# Patient Record
Sex: Male | Born: 1955 | Race: Black or African American | Hispanic: No | Marital: Married | Smoking: Never smoker
Health system: Southern US, Community
[De-identification: ages and names within clinical notes are randomized; demographics above are authoritative.]

## PROBLEM LIST (undated history)

## (undated) DIAGNOSIS — K649 Unspecified hemorrhoids: Secondary | ICD-10-CM

## (undated) DIAGNOSIS — E785 Hyperlipidemia, unspecified: Secondary | ICD-10-CM

## (undated) DIAGNOSIS — N529 Male erectile dysfunction, unspecified: Secondary | ICD-10-CM

## (undated) DIAGNOSIS — G43909 Migraine, unspecified, not intractable, without status migrainosus: Secondary | ICD-10-CM

## (undated) DIAGNOSIS — R7303 Prediabetes: Secondary | ICD-10-CM

## (undated) DIAGNOSIS — I10 Essential (primary) hypertension: Secondary | ICD-10-CM

## (undated) DIAGNOSIS — N35919 Unspecified urethral stricture, male, unspecified site: Secondary | ICD-10-CM

## (undated) HISTORY — DX: Prediabetes: R73.03

## (undated) HISTORY — DX: Essential (primary) hypertension: I10

## (undated) HISTORY — PX: BUNIONECTOMY: SHX129

## (undated) HISTORY — DX: Unspecified hemorrhoids: K64.9

## (undated) HISTORY — DX: Male erectile dysfunction, unspecified: N52.9

## (undated) HISTORY — PX: INGUINAL HERNIA REPAIR: SUR1180

## (undated) HISTORY — PX: APPENDECTOMY: SHX54

## (undated) HISTORY — DX: Hyperlipidemia, unspecified: E78.5

---

## 1998-03-17 ENCOUNTER — Encounter: Payer: Self-pay | Admitting: Urology

## 1998-03-17 ENCOUNTER — Ambulatory Visit (HOSPITAL_COMMUNITY): Admission: RE | Admit: 1998-03-17 | Discharge: 1998-03-17 | Payer: Self-pay | Admitting: Urology

## 1998-03-31 ENCOUNTER — Ambulatory Visit (HOSPITAL_BASED_OUTPATIENT_CLINIC_OR_DEPARTMENT_OTHER): Admission: RE | Admit: 1998-03-31 | Discharge: 1998-03-31 | Payer: Self-pay | Admitting: Urology

## 1998-04-21 ENCOUNTER — Ambulatory Visit (HOSPITAL_COMMUNITY): Admission: RE | Admit: 1998-04-21 | Discharge: 1998-04-21 | Payer: Self-pay | Admitting: *Deleted

## 2001-04-03 ENCOUNTER — Ambulatory Visit (HOSPITAL_COMMUNITY): Admission: RE | Admit: 2001-04-03 | Discharge: 2001-04-03 | Payer: Self-pay | Admitting: *Deleted

## 2003-02-15 ENCOUNTER — Emergency Department (HOSPITAL_COMMUNITY): Admission: EM | Admit: 2003-02-15 | Discharge: 2003-02-15 | Payer: Self-pay | Admitting: Emergency Medicine

## 2003-03-22 ENCOUNTER — Emergency Department (HOSPITAL_COMMUNITY): Admission: EM | Admit: 2003-03-22 | Discharge: 2003-03-23 | Payer: Self-pay | Admitting: Emergency Medicine

## 2008-04-25 ENCOUNTER — Emergency Department (HOSPITAL_BASED_OUTPATIENT_CLINIC_OR_DEPARTMENT_OTHER): Admission: EM | Admit: 2008-04-25 | Discharge: 2008-04-25 | Payer: Self-pay | Admitting: Emergency Medicine

## 2008-11-22 ENCOUNTER — Emergency Department (HOSPITAL_BASED_OUTPATIENT_CLINIC_OR_DEPARTMENT_OTHER): Admission: EM | Admit: 2008-11-22 | Discharge: 2008-11-22 | Payer: Self-pay | Admitting: Emergency Medicine

## 2009-04-03 ENCOUNTER — Ambulatory Visit: Payer: Self-pay | Admitting: Family Medicine

## 2009-04-03 ENCOUNTER — Emergency Department (HOSPITAL_BASED_OUTPATIENT_CLINIC_OR_DEPARTMENT_OTHER): Admission: EM | Admit: 2009-04-03 | Discharge: 2009-04-04 | Payer: Self-pay | Admitting: Emergency Medicine

## 2009-04-03 DIAGNOSIS — I10 Essential (primary) hypertension: Secondary | ICD-10-CM | POA: Insufficient documentation

## 2009-04-03 DIAGNOSIS — E785 Hyperlipidemia, unspecified: Secondary | ICD-10-CM | POA: Insufficient documentation

## 2009-04-16 ENCOUNTER — Ambulatory Visit (HOSPITAL_COMMUNITY): Admission: RE | Admit: 2009-04-16 | Discharge: 2009-04-16 | Payer: Self-pay | Admitting: Gastroenterology

## 2009-04-20 HISTORY — PX: COLONOSCOPY: SHX174

## 2009-05-14 ENCOUNTER — Ambulatory Visit (HOSPITAL_COMMUNITY): Admission: RE | Admit: 2009-05-14 | Discharge: 2009-05-14 | Payer: Self-pay | Admitting: General Surgery

## 2009-05-14 HISTORY — PX: OTHER SURGICAL HISTORY: SHX169

## 2009-08-29 ENCOUNTER — Emergency Department (HOSPITAL_BASED_OUTPATIENT_CLINIC_OR_DEPARTMENT_OTHER): Admission: EM | Admit: 2009-08-29 | Discharge: 2009-08-29 | Payer: Self-pay | Admitting: Emergency Medicine

## 2010-03-08 NOTE — Assessment & Plan Note (Signed)
Summary: Sinus pressure, teeth/eye pain x last night  proced rm   Vital Signs:  Patient Profile:   55 Years Old Male CC:      Headache, Nausea x  Height:     76 inches Weight:      240 pounds O2 Sat:      96 % O2 treatment:    Room Air Temp:     98.0 degrees F oral Pulse rate:   53 / minute Pulse rhythm:   irregular Resp:     16 per minute BP sitting:   147 / 86  (right arm) Cuff size:   regular  Vitals Entered By: Areta Haber CMA (April 03, 2009 9:56 AM)                  Current Allergies: No known allergies History of Present Illness History from: patient Chief Complaint: Headache, Nausea x  History of Present Illness: ha last eve, now with nausea and head congestion  Current Problems: UPPER RESPIRATORY INFECTION, ACUTE (ICD-465.9) MIGRAINE (ICD-346.00) FAMILY HISTORY DIABETES 1ST DEGREE RELATIVE (ICD-V18.0) HYPERTENSION (ICD-401.9) HYPERLIPIDEMIA (ICD-272.4)   Current Meds ASPIR-LOW 81 MG TBEC (ASPIRIN) 1 tab by mouth once daily LOVAZA 1 GM CAPS (OMEGA-3-ACID ETHYL ESTERS) 1 tab by mouth once daily VITAMIN C 1000 MG TABS (ASCORBIC ACID) 1 tab by mouth once daily * BP MEDICATION 1 tab by mouth once daily * ATROVENT NASAL SPRAY0.06% 1 spray each nostril qid  REVIEW OF SYSTEMS Constitutional Symptoms       Complains of fatigue.     Denies fever, chills, night sweats, weight loss, and weight gain.  Eyes       Complains of eye pain.      Denies change in vision, eye discharge, glasses, contact lenses, and eye surgery. Ear/Nose/Throat/Mouth       Complains of sinus problems and tooth pain or bleeding.      Denies hearing loss/aids, change in hearing, ear pain, ear discharge, dizziness, frequent runny nose, frequent nose bleeds, sore throat, and hoarseness.      Comments: x last night Respiratory       Denies dry cough, productive cough, wheezing, shortness of breath, asthma, bronchitis, and emphysema/COPD.  Cardiovascular       Denies murmurs, chest pain,  and tires easily with exhertion.    Gastrointestinal       Complains of nausea/vomiting.      Denies stomach pain, diarrhea, constipation, blood in bowel movements, and indigestion. Genitourniary       Denies painful urination, kidney stones, and loss of urinary control. Neurological       Complains of headaches.      Denies paralysis, seizures, and fainting/blackouts. Musculoskeletal       Denies muscle pain, joint pain, joint stiffness, decreased range of motion, redness, swelling, muscle weakness, and gout.  Skin       Denies bruising, unusual mles/lumps or sores, and hair/skin or nail changes.  Psych       Denies mood changes, temper/anger issues, anxiety/stress, speech problems, depression, and sleep problems.  Past History:  Past Medical History: Hyperlipidemia Hypertension  Past Surgical History: Appendectomy foot surgery  Family History: Family History Diabetes 1st degree relative Family History Hypertension  Social History: Married Never Smoked Alcohol use-yes Drug use-yes Regular exercise-no Smoking Status:  never Drug Use:  yes Does Patient Exercise:  no Physical Exam General appearance: well developed, well nourished, no acute distress Head: normocephalic, atraumatic Eyes: conjunctivae and lids normal Pupils: equal, round,  reactive to light Ears: normal, no lesions or deformities Nasal: mucosa pink, nonedematous, no septal deviation, turbinates normal Oral/Pharynx: tongue normal, posterior pharynx without erythema or exudate Neck: neck supple,  trachea midline, no masses Thyroid: no nodules, masses, tenderness, or enlargement Chest/Lungs: no rales, wheezes, or rhonchi bilateral, breath sounds equal without effort Heart: regular rate and  rhythm, no murmur Neurological: grossly intact and non-focal mild photophobia MSE: oriented to time, place, and person Assessment New Problems: UPPER RESPIRATORY INFECTION, ACUTE (ICD-465.9) MIGRAINE  (ICD-346.00) FAMILY HISTORY DIABETES 1ST DEGREE RELATIVE (ICD-V18.0) HYPERTENSION (ICD-401.9) HYPERLIPIDEMIA (ICD-272.4)   Patient Education: Patient and/or caregiver instructed in the following: rest, fluids, rest fluids and Tylenol.  Plan New Medications/Changes: ATROVENT NASAL SPRAY0.06% 1 spray each nostril qid  #1 x 1, 04/03/2009, Quita Skye Kindl MD  New Orders: Ketorolac-Toradol 15mg  [P2951] Zofran 1mg . injection [J2405] Admin of Therapeutic Inj  intramuscular or subcutaneous [96372] Est. Patient Level III [88416]  The patient and/or caregiver has been counseled thoroughly with regard to medications prescribed including dosage, schedule, interactions, rationale for use, and possible side effects and they verbalize understanding.  Diagnoses and expected course of recovery discussed and will return if not improved as expected or if the condition worsens. Patient and/or caregiver verbalized understanding.  Prescriptions: ATROVENT NASAL SPRAY0.06% 1 spray each nostril qid  #1 x 1   Entered and Authorized by:   Linna Hoff MD   Signed by:   Linna Hoff MD on 04/03/2009   Method used:   Print then Give to Patient   RxID:   [id number]   Patient Instructions: 1)  home to rest , see your doctor if further problems 2)  Please schedule a follow-up appointment as needed.   Medication Administration  Injection # 1:    Medication: Ketorolac-Toradol 15mg     Diagnosis: UPPER RESPIRATORY INFECTION, ACUTE (ICD-465.9)    Route: IM    Site: RUOQ gluteus    Exp Date: 09/07/2010    Lot #: [id number]    Mfr: Hospira    Comments: Administered 30 mg    Patient tolerated injection without complications    Given by: Areta Haber CMA (April 03, 2009 10:36 AM)  Injection # 2:    Medication: Zofran 1mg . injection    Diagnosis: UPPER RESPIRATORY INFECTION, ACUTE (ICD-465.9)    Route: IM    Site: LUOQ gluteus    Exp Date: 09/07/2010    Lot #: [id number]    Mfr: Hospira     Comments: Admininstered 4mg     Patient tolerated injection without complications    Given by: Areta Haber CMA (April 03, 2009 10:37 AM)  Orders Added: 1)  Ketorolac-Toradol 15mg  [J1885] 2)  Zofran 1mg . injection [J2405] 3)  Admin of Therapeutic Inj  intramuscular or subcutaneous [96372] 4)  Est. Patient Level III [73220]

## 2010-04-27 LAB — DIFFERENTIAL
Basophils Absolute: 0 10*3/uL (ref 0.0–0.1)
Basophils Relative: 0 % (ref 0–1)
Eosinophils Absolute: 0 10*3/uL (ref 0.0–0.7)
Eosinophils Relative: 1 % (ref 0–5)
Lymphocytes Relative: 47 % — ABNORMAL HIGH (ref 12–46)
Lymphs Abs: 1.5 10*3/uL (ref 0.7–4.0)
Monocytes Absolute: 0.2 10*3/uL (ref 0.1–1.0)
Monocytes Relative: 7 % (ref 3–12)
Neutro Abs: 1.4 10*3/uL — ABNORMAL LOW (ref 1.7–7.7)
Neutrophils Relative %: 44 % (ref 43–77)

## 2010-04-27 LAB — CBC
HCT: 46.3 % (ref 39.0–52.0)
Hemoglobin: 15.7 g/dL (ref 13.0–17.0)
MCHC: 33.9 g/dL (ref 30.0–36.0)
MCV: 94.4 fL (ref 78.0–100.0)
Platelets: 180 10*3/uL (ref 150–400)
RBC: 4.9 MIL/uL (ref 4.22–5.81)
RDW: 14 % (ref 11.5–15.5)
WBC: 3.2 10*3/uL — ABNORMAL LOW (ref 4.0–10.5)

## 2010-04-27 LAB — BASIC METABOLIC PANEL
BUN: 15 mg/dL (ref 6–23)
CO2: 30 mEq/L (ref 19–32)
Calcium: 9.3 mg/dL (ref 8.4–10.5)
Chloride: 103 mEq/L (ref 96–112)
Creatinine, Ser: 0.93 mg/dL (ref 0.4–1.5)
GFR calc Af Amer: >60 mL/min (ref >60)
GFR calc non Af Amer: >60 mL/min (ref >60)
Glucose, Bld: 121 mg/dL — ABNORMAL HIGH (ref 70–99)
Potassium: 4.1 mEq/L (ref 3.5–5.1)
Sodium: 139 mEq/L (ref 135–145)

## 2010-06-10 ENCOUNTER — Encounter (HOSPITAL_BASED_OUTPATIENT_CLINIC_OR_DEPARTMENT_OTHER)
Admission: RE | Admit: 2010-06-10 | Discharge: 2010-06-10 | Disposition: A | Discharge status: Home / Self Care | Payer: 59 | Source: Ambulatory Visit | Attending: General Surgery | Admitting: General Surgery

## 2010-06-10 ENCOUNTER — Ambulatory Visit (HOSPITAL_BASED_OUTPATIENT_CLINIC_OR_DEPARTMENT_OTHER)
Admission: RE | Admit: 2010-06-10 | Discharge: 2010-06-10 | Disposition: A | Discharge status: Home / Self Care | Payer: 59 | Source: Ambulatory Visit | Attending: General Surgery | Admitting: General Surgery

## 2010-06-10 ENCOUNTER — Other Ambulatory Visit (HOSPITAL_BASED_OUTPATIENT_CLINIC_OR_DEPARTMENT_OTHER): Payer: Self-pay | Admitting: General Surgery

## 2010-06-10 DIAGNOSIS — Z01811 Encounter for preprocedural respiratory examination: Secondary | ICD-10-CM

## 2010-06-10 DIAGNOSIS — Z01818 Encounter for other preprocedural examination: Secondary | ICD-10-CM | POA: Insufficient documentation

## 2010-06-10 LAB — BASIC METABOLIC PANEL
BUN: 21 mg/dL (ref 6–23)
CO2: 30 mEq/L (ref 19–32)
Calcium: 9.7 mg/dL (ref 8.4–10.5)
Chloride: 102 mEq/L (ref 96–112)
Creatinine, Ser: 0.98 mg/dL (ref 0.4–1.5)
GFR calc Af Amer: >60 mL/min (ref >60)
GFR calc non Af Amer: >60 mL/min (ref >60)
Glucose, Bld: 109 mg/dL — ABNORMAL HIGH (ref 70–99)
Potassium: 3.7 mEq/L (ref 3.5–5.1)
Sodium: 139 mEq/L (ref 135–145)

## 2010-06-10 LAB — CBC
HCT: 43.3 % (ref 39.0–52.0)
Hemoglobin: 15.1 g/dL (ref 13.0–17.0)
MCH: 32.2 pg (ref 26.0–34.0)
MCHC: 34.9 g/dL (ref 30.0–36.0)
MCV: 92.3 fL (ref 78.0–100.0)
Platelets: 202 10*3/uL (ref 150–400)
RBC: 4.69 MIL/uL (ref 4.22–5.81)
RDW: 14.2 % (ref 11.5–15.5)
WBC: 4.8 10*3/uL (ref 4.0–10.5)

## 2010-06-10 LAB — DIFFERENTIAL
Basophils Absolute: 0 10*3/uL (ref 0.0–0.1)
Basophils Relative: 0 % (ref 0–1)
Eosinophils Absolute: 0.1 10*3/uL (ref 0.0–0.7)
Eosinophils Relative: 1 % (ref 0–5)
Lymphocytes Relative: 42 % (ref 12–46)
Lymphs Abs: 2 10*3/uL (ref 0.7–4.0)
Monocytes Absolute: 0.4 10*3/uL (ref 0.1–1.0)
Monocytes Relative: 8 % (ref 3–12)
Neutro Abs: 2.3 10*3/uL (ref 1.7–7.7)
Neutrophils Relative %: 49 % (ref 43–77)

## 2010-06-14 ENCOUNTER — Ambulatory Visit (HOSPITAL_BASED_OUTPATIENT_CLINIC_OR_DEPARTMENT_OTHER)
Admission: RE | Admit: 2010-06-14 | Discharge: 2010-06-14 | Disposition: A | Discharge status: Home / Self Care | Payer: 59 | Source: Ambulatory Visit | Attending: General Surgery | Admitting: General Surgery

## 2010-06-14 ENCOUNTER — Other Ambulatory Visit: Payer: Self-pay | Admitting: General Surgery

## 2010-06-14 DIAGNOSIS — K648 Other hemorrhoids: Secondary | ICD-10-CM | POA: Insufficient documentation

## 2010-06-14 DIAGNOSIS — Z01812 Encounter for preprocedural laboratory examination: Secondary | ICD-10-CM | POA: Insufficient documentation

## 2010-06-14 DIAGNOSIS — Z01818 Encounter for other preprocedural examination: Secondary | ICD-10-CM | POA: Insufficient documentation

## 2010-06-14 DIAGNOSIS — K623 Rectal prolapse: Secondary | ICD-10-CM | POA: Insufficient documentation

## 2010-06-14 DIAGNOSIS — K644 Residual hemorrhoidal skin tags: Secondary | ICD-10-CM | POA: Insufficient documentation

## 2010-06-14 DIAGNOSIS — Z01810 Encounter for preprocedural cardiovascular examination: Secondary | ICD-10-CM | POA: Insufficient documentation

## 2010-06-14 HISTORY — PX: HEMORRHOIDECTOMY WITH HEMORRHOID BANDING: SHX5633

## 2010-07-06 NOTE — Op Note (Signed)
NAMEMALVERN, KADLEC NO.:  [id number]  MEDICAL RECORD NO.:  [id number]           PATIENT TYPE:  O  LOCATION:  XRAY                          FACILITY:  [facility]  PHYSICIAN:  Cherylynn Ridges, M.D.    DATE OF BIRTH:  05-10-1955  DATE OF PROCEDURE:  06/14/2010 DATE OF DISCHARGE:  06/10/2010                              OPERATIVE REPORT   PREOPERATIVE DIAGNOSIS:  Internal and external hemorrhoids.  POSTOPERATIVE DIAGNOSIS:  Internal and external hemorrhoids at 1 o'clock, 5 o'clock, and 7 o'clock position with also rectal prolapse grade 1.  PROCEDURE:  Single-column internal and external hemorrhoidectomy at the 5 o'clock hemorrhoidal position along with rubber band ligation of the hemorrhoids at the 1 and 7 o'clock positions.  SURGEON:  Marta Lamas. Lindie Spruce, MD  ANESTHESIA:  General endotracheal was injected with 8 mL of 0.25% Marcaine with epinephrine and also packed with Gelfoam soaked dibucaine ointment gauze.  COMPLICATIONS:  None.  CONDITION:  Stable.  FINDINGS:  The patient had what appeared to be a rectal prolapse with markedly redundant rectal mucosa, particularly in about the 2-3 o'clock position on the right side with 12 o'clock being directly posterior.  He had moderate internal hemorrhoids at the 1 o'clock and 7 o'clock position which were treated with rubber band ligation and then a larger internal-external hemorrhoid at approximately 5 o'clock position which was excised with a locking chromic stitch.  INDICATIONS FOR OPERATION:  The patient is a 55 year old who has been treated in the office multiple times for hemorrhoids with rubber band ligation and injection and now comes in for exam under anesthesia and hemorrhoidectomy.  OPERATION DETAILS:  The patient was taken to the operating room and placed on table initially in supine position.  After an adequate endotracheal anesthetic was administered, he was placed in a jackknife prone position and  prepped and draped in the usual sterile manner using Betadine.  After proper time-out was performed identifying the patient and procedure to be performed, we used an anal speculum in order to inspect the entire anorectal area.  In the 2-3 o'clock position, the patient had what appeared to be a markedly enlarged hemorrhoid, but upon further examination there appeared to be more redundant rectal mucosa protruding down through the anal canal.  Just underneath that about the 1 o'clock position was a moderate-sized internal hemorrhoid which we treated with a rubber band ligation.  Almost directly posterior at the 5 o'clock position, there was a large internal hemorrhoid associated with an external hemorrhoid which we treated with ligation.  We placed a 3-0 chromic stitch at the base, then excised it with a #15 blade, and then a running locking stitch of 3-0 chromic was used to close the incision.  We injected directly with 0.25% Marcaine with epinephrine prior to doing so, however, we obtained hemostasis with electrocautery.  Further inspection demonstrated a moderate to small-sized hemorrhoid at the 7 o'clock position which was treated with the rubber band ligation also.  There was minimal bleeding at the ending of the process.  We irrigated with saline solution.  We injected the Marcaine as described previously and  placed a whole rolled up wad of Gelfoam soaked with dibucaine ointment into the anal canal with minimal difficulty. Dressings were applied including 4 x 4s and ABDs.  All counts were correct.     Cherylynn Ridges, M.D.     JOW/MEDQ  D:  06/14/2010  T:  06/14/2010  Job:  [id number]  Electronically Signed by Jimmye Norman M.D. on 07/06/2010 05:08:00 PM

## 2010-07-26 ENCOUNTER — Encounter (INDEPENDENT_AMBULATORY_CARE_PROVIDER_SITE_OTHER): Payer: Self-pay | Admitting: General Surgery

## 2010-11-07 ENCOUNTER — Other Ambulatory Visit: Payer: Self-pay | Admitting: Orthopedic Surgery

## 2010-11-07 ENCOUNTER — Ambulatory Visit
Admission: RE | Admit: 2010-11-07 | Discharge: 2010-11-07 | Disposition: A | Discharge status: Home / Self Care | Payer: 59 | Source: Ambulatory Visit | Attending: Orthopedic Surgery | Admitting: Orthopedic Surgery

## 2010-11-07 DIAGNOSIS — R52 Pain, unspecified: Secondary | ICD-10-CM

## 2010-11-21 ENCOUNTER — Other Ambulatory Visit: Payer: Self-pay | Admitting: Orthopedic Surgery

## 2010-11-21 DIAGNOSIS — S43109A Unspecified dislocation of unspecified acromioclavicular joint, initial encounter: Secondary | ICD-10-CM

## 2010-11-22 ENCOUNTER — Other Ambulatory Visit: Payer: Self-pay | Admitting: Internal Medicine

## 2010-11-24 ENCOUNTER — Ambulatory Visit
Admission: RE | Admit: 2010-11-24 | Discharge: 2010-11-24 | Disposition: A | Discharge status: Home / Self Care | Payer: 59 | Source: Ambulatory Visit | Attending: Orthopedic Surgery | Admitting: Orthopedic Surgery

## 2010-11-24 ENCOUNTER — Ambulatory Visit: Payer: 59 | Attending: Orthopedic Surgery | Admitting: Physical Therapy

## 2010-11-24 DIAGNOSIS — IMO0001 Reserved for inherently not codable concepts without codable children: Secondary | ICD-10-CM | POA: Insufficient documentation

## 2010-11-24 DIAGNOSIS — S43109A Unspecified dislocation of unspecified acromioclavicular joint, initial encounter: Secondary | ICD-10-CM

## 2010-11-24 DIAGNOSIS — M25539 Pain in unspecified wrist: Secondary | ICD-10-CM | POA: Insufficient documentation

## 2010-11-24 DIAGNOSIS — M25519 Pain in unspecified shoulder: Secondary | ICD-10-CM | POA: Insufficient documentation

## 2010-12-26 ENCOUNTER — Encounter (HOSPITAL_COMMUNITY): Payer: Self-pay | Admitting: Pharmacy Technician

## 2010-12-27 NOTE — Progress Notes (Signed)
Called Dr. Celene Skeen office, spoke with [facility] to request orders for surgery.

## 2010-12-30 ENCOUNTER — Encounter (HOSPITAL_COMMUNITY): Payer: Self-pay

## 2011-01-02 ENCOUNTER — Ambulatory Visit (HOSPITAL_COMMUNITY)
Admission: RE | Admit: 2011-01-02 | Discharge: 2011-01-02 | Disposition: A | Discharge status: Home / Self Care | Payer: 59 | Source: Ambulatory Visit | Attending: Orthopedic Surgery | Admitting: Orthopedic Surgery

## 2011-01-02 ENCOUNTER — Inpatient Hospital Stay (HOSPITAL_COMMUNITY): Admission: RE | Admit: 2011-01-02 | Payer: 59 | Source: Ambulatory Visit

## 2011-01-02 ENCOUNTER — Encounter (HOSPITAL_COMMUNITY): Payer: Self-pay | Admitting: Certified Registered"

## 2011-01-02 ENCOUNTER — Encounter (HOSPITAL_COMMUNITY): Payer: Self-pay | Admitting: *Deleted

## 2011-01-02 ENCOUNTER — Encounter (HOSPITAL_COMMUNITY)
Admission: RE | Disposition: A | Discharge status: Home / Self Care | Payer: Self-pay | Source: Ambulatory Visit | Attending: Orthopedic Surgery

## 2011-01-02 ENCOUNTER — Ambulatory Visit (HOSPITAL_COMMUNITY): Payer: 59 | Admitting: Certified Registered"

## 2011-01-02 DIAGNOSIS — M719 Bursopathy, unspecified: Secondary | ICD-10-CM | POA: Insufficient documentation

## 2011-01-02 DIAGNOSIS — M67919 Unspecified disorder of synovium and tendon, unspecified shoulder: Secondary | ICD-10-CM | POA: Insufficient documentation

## 2011-01-02 DIAGNOSIS — M7542 Impingement syndrome of left shoulder: Secondary | ICD-10-CM

## 2011-01-02 DIAGNOSIS — M25819 Other specified joint disorders, unspecified shoulder: Secondary | ICD-10-CM | POA: Insufficient documentation

## 2011-01-02 DIAGNOSIS — I10 Essential (primary) hypertension: Secondary | ICD-10-CM | POA: Insufficient documentation

## 2011-01-02 HISTORY — PX: SHOULDER ARTHROSCOPY: SHX128

## 2011-01-02 LAB — URINALYSIS, ROUTINE W REFLEX MICROSCOPIC
Glucose, UA: NEGATIVE mg/dL
Hgb urine dipstick: NEGATIVE
Ketones, ur: NEGATIVE mg/dL
Leukocytes, UA: NEGATIVE
Nitrite: NEGATIVE
Protein, ur: NEGATIVE mg/dL
Specific Gravity, Urine: 1.024 (ref 1.005–1.030)
Urobilinogen, UA: 0.2 mg/dL (ref 0.0–1.0)
pH: 5.5 (ref 5.0–8.0)

## 2011-01-02 LAB — CBC
HCT: 42 % (ref 39.0–52.0)
Hemoglobin: 14.1 g/dL (ref 13.0–17.0)
MCH: 31.5 pg (ref 26.0–34.0)
MCHC: 33.6 g/dL (ref 30.0–36.0)
MCV: 93.8 fL (ref 78.0–100.0)
Platelets: 184 10*3/uL (ref 150–400)
RBC: 4.48 MIL/uL (ref 4.22–5.81)
RDW: 14.2 % (ref 11.5–15.5)
WBC: 4.7 10*3/uL (ref 4.0–10.5)

## 2011-01-02 LAB — COMPREHENSIVE METABOLIC PANEL
ALT: 21 U/L (ref 0–53)
AST: 16 U/L (ref 0–37)
Albumin: 3.4 g/dL — ABNORMAL LOW (ref 3.5–5.2)
Alkaline Phosphatase: 32 U/L — ABNORMAL LOW (ref 39–117)
BUN: 25 mg/dL — ABNORMAL HIGH (ref 6–23)
CO2: 28 mEq/L (ref 19–32)
Calcium: 9 mg/dL (ref 8.4–10.5)
Chloride: 107 mEq/L (ref 96–112)
Creatinine, Ser: 1.04 mg/dL (ref 0.50–1.35)
GFR calc Af Amer: >90 mL/min (ref >90)
GFR calc non Af Amer: 78 mL/min — ABNORMAL LOW (ref >90)
Glucose, Bld: 109 mg/dL — ABNORMAL HIGH (ref 70–99)
Potassium: 3.6 mEq/L (ref 3.5–5.1)
Sodium: 143 mEq/L (ref 135–145)
Total Bilirubin: 0.2 mg/dL — ABNORMAL LOW (ref 0.3–1.2)
Total Protein: 6.1 g/dL (ref 6.0–8.3)

## 2011-01-02 LAB — SURGICAL PCR SCREEN
MRSA, PCR: NEGATIVE
Staphylococcus aureus: NEGATIVE

## 2011-01-02 SURGERY — ARTHROSCOPY, SHOULDER
Anesthesia: General | Site: Shoulder | Laterality: Left | Wound class: Clean

## 2011-01-02 MED ORDER — MUPIROCIN 2 % EX OINT
TOPICAL_OINTMENT | CUTANEOUS | Status: AC
  Filled 2011-01-02: qty 22

## 2011-01-02 MED ORDER — HYDROMORPHONE HCL PF 1 MG/ML IJ SOLN
0.2500 mg | INTRAMUSCULAR | Status: DC | PRN
  Administered 2011-01-02 (×2): 0.5 mg via INTRAVENOUS

## 2011-01-02 MED ORDER — MUPIROCIN 2 % EX OINT
1.0000 "application " | TOPICAL_OINTMENT | Freq: Once | CUTANEOUS | Status: AC
  Administered 2011-01-02: 07:00:00 via NASAL

## 2011-01-02 MED ORDER — ACETAMINOPHEN 10 MG/ML IV SOLN
INTRAVENOUS | Status: DC | PRN
  Administered 2011-01-02: 1000 mg via INTRAVENOUS

## 2011-01-02 MED ORDER — PROPOFOL 10 MG/ML IV EMUL
INTRAVENOUS | Status: DC | PRN
  Administered 2011-01-02: 200 mg via INTRAVENOUS

## 2011-01-02 MED ORDER — NEOSTIGMINE METHYLSULFATE 1 MG/ML IJ SOLN
INTRAMUSCULAR | Status: DC | PRN
  Administered 2011-01-02: 5 mg via INTRAVENOUS

## 2011-01-02 MED ORDER — ONDANSETRON HCL 4 MG/2ML IJ SOLN: 4.0000 mg | Freq: Once | INTRAMUSCULAR | Status: DC | PRN

## 2011-01-02 MED ORDER — ONDANSETRON HCL 4 MG/2ML IJ SOLN
INTRAMUSCULAR | Status: DC | PRN
  Administered 2011-01-02: 4 mg via INTRAVENOUS

## 2011-01-02 MED ORDER — ROCURONIUM BROMIDE 100 MG/10ML IV SOLN
INTRAVENOUS | Status: DC | PRN
  Administered 2011-01-02: 50 mg via INTRAVENOUS

## 2011-01-02 MED ORDER — EPINEPHRINE HCL 1 MG/ML IJ SOLN
INTRAMUSCULAR | Status: DC | PRN
  Administered 2011-01-02: 1 mg

## 2011-01-02 MED ORDER — FENTANYL CITRATE 0.05 MG/ML IJ SOLN
INTRAMUSCULAR | Status: DC | PRN
  Administered 2011-01-02: 150 ug via INTRAVENOUS
  Administered 2011-01-02 (×2): 50 ug via INTRAVENOUS

## 2011-01-02 MED ORDER — CEFAZOLIN SODIUM-DEXTROSE 2-3 GM-% IV SOLR
2.0000 g | INTRAVENOUS | Status: AC
  Administered 2011-01-02: 2 g via INTRAVENOUS
  Filled 2011-01-02: qty 50

## 2011-01-02 MED ORDER — OXYCODONE-ACETAMINOPHEN 7.5-325 MG PO TABS: 1.0000 | ORAL_TABLET | ORAL | Status: AC | PRN

## 2011-01-02 MED ORDER — GLYCOPYRROLATE 0.2 MG/ML IJ SOLN
INTRAMUSCULAR | Status: DC | PRN
  Administered 2011-01-02: .7 mg via INTRAVENOUS

## 2011-01-02 MED ORDER — ACETAMINOPHEN 10 MG/ML IV SOLN
INTRAVENOUS | Status: AC
  Filled 2011-01-02: qty 100

## 2011-01-02 MED ORDER — SODIUM CHLORIDE 0.9 % IR SOLN
Status: DC | PRN
  Administered 2011-01-02: 3000 mL

## 2011-01-02 MED ORDER — LACTATED RINGERS IV SOLN
INTRAVENOUS | Status: DC | PRN
  Administered 2011-01-02: 07:00:00 via INTRAVENOUS

## 2011-01-02 MED ORDER — BUPIVACAINE-EPINEPHRINE 0.25% -1:200000 IJ SOLN
INTRAMUSCULAR | Status: DC | PRN
  Administered 2011-01-02: 14 mL

## 2011-01-02 MED ORDER — CEFAZOLIN SODIUM 1-5 GM-% IV SOLN
INTRAVENOUS | Status: AC
  Filled 2011-01-02: qty 100

## 2011-01-02 SURGICAL SUPPLY — 47 items
BLADE CUTTER GATOR 3.5 (BLADE) IMPLANT
BLADE GREAT WHITE 4.2 (BLADE) ×2 IMPLANT
BLADE SURG 11 STRL SS (BLADE) ×2 IMPLANT
BUR OVAL 4.0 (BURR) IMPLANT
CLOTH BEACON ORANGE TIMEOUT ST (SAFETY) ×2 IMPLANT
COVER SURGICAL LIGHT HANDLE (MISCELLANEOUS) ×2 IMPLANT
DRAPE STERI 35X30 U-POUCH (DRAPES) ×2 IMPLANT
DRAPE U-SHAPE 47X51 STRL (DRAPES) ×2 IMPLANT
DRSG EMULSION OIL 3X3 NADH (GAUZE/BANDAGES/DRESSINGS) ×1 IMPLANT
DRSG PAD ABDOMINAL 8X10 ST (GAUZE/BANDAGES/DRESSINGS) ×1 IMPLANT
DURAPREP 26ML APPLICATOR (WOUND CARE) ×2 IMPLANT
ELECT MENISCUS 165MM 90D (ELECTRODE) IMPLANT
FILTER STRAW FLUID ASPIR (MISCELLANEOUS) ×2 IMPLANT
GLOVE BIOGEL PI IND STRL 6.5 (GLOVE) IMPLANT
GLOVE BIOGEL PI INDICATOR 6.5 (GLOVE) ×2
GLOVE ECLIPSE 6.5 STRL STRAW (GLOVE) ×1 IMPLANT
GLOVE SS PI 9.0 STRL (GLOVE) ×2 IMPLANT
GOWN PREVENTION PLUS XLARGE (GOWN DISPOSABLE) ×2 IMPLANT
GOWN STRL NON-REIN LRG LVL3 (GOWN DISPOSABLE) ×4 IMPLANT
KIT BASIN OR (CUSTOM PROCEDURE TRAY) ×2 IMPLANT
KIT ROOM TURNOVER OR (KITS) ×2 IMPLANT
MANIFOLD NEPTUNE II (INSTRUMENTS) ×2 IMPLANT
NDL HYPO 21X1.5 SAFETY (NEEDLE) ×1 IMPLANT
NDL HYPO 25GX1X1/2 BEV (NEEDLE) ×1 IMPLANT
NEEDLE HYPO 21X1.5 SAFETY (NEEDLE) ×2 IMPLANT
NEEDLE HYPO 25GX1X1/2 BEV (NEEDLE) ×2 IMPLANT
NS IRRIG 1000ML POUR BTL (IV SOLUTION) ×2 IMPLANT
PACK SHOULDER (CUSTOM PROCEDURE TRAY) ×2 IMPLANT
PAD ARMBOARD 7.5X6 YLW CONV (MISCELLANEOUS) ×2 IMPLANT
SET ARTHROSCOPY TUBING (MISCELLANEOUS) ×4
SET ARTHROSCOPY TUBING LN (MISCELLANEOUS) ×1 IMPLANT
SLING ARM IMMOBILIZER XL (CAST SUPPLIES) ×1 IMPLANT
SPONGE GAUZE 4X4 12PLY (GAUZE/BANDAGES/DRESSINGS) ×2 IMPLANT
SPONGE LAP 4X18 X RAY DECT (DISPOSABLE) ×1 IMPLANT
SUT ETHIBOND 2 OS 4 DA (SUTURE) ×1 IMPLANT
SUT ETHILON 4 0 PS 2 18 (SUTURE) ×3 IMPLANT
SUT PROLENE 3 0 PS 2 (SUTURE) ×1 IMPLANT
SUT VIC AB 0 CTB1 27 (SUTURE) ×1 IMPLANT
SUT VIC AB 2-0 CT1 27 (SUTURE) ×2
SUT VIC AB 2-0 CT1 TAPERPNT 27 (SUTURE) IMPLANT
SUT VIC AB 2-0 FS1 27 (SUTURE) IMPLANT
SYR 3ML LL SCALE MARK (SYRINGE) ×4 IMPLANT
SYR CONTROL 10ML LL (SYRINGE) ×2 IMPLANT
TAPE CLOTH SURG 4X10 WHT LF (GAUZE/BANDAGES/DRESSINGS) ×1 IMPLANT
TOWEL OR 17X24 6PK STRL BLUE (TOWEL DISPOSABLE) ×2 IMPLANT
TOWEL OR 17X26 10 PK STRL BLUE (TOWEL DISPOSABLE) ×2 IMPLANT
WATER STERILE IRR 1000ML POUR (IV SOLUTION) ×2 IMPLANT

## 2011-01-02 NOTE — H&P (Signed)
NAME:  Kenneth Melendez, LAMP NO.:  [id number]  MEDICAL RECORD NO.:  [id number]  LOCATION:  [facility]                         FACILITY:  [facility]  PHYSICIAN:  Myrtie Neither, MD      DATE OF BIRTH:  August 12, 1955  DATE OF ADMISSION:  01/02/2011 DATE OF DISCHARGE:                             HISTORY & PHYSICAL   CHIEF COMPLAINT:  Painful left shoulder.  HISTORY OF PRESENT ILLNESS:  This is a 55 year old who has sustained injury to his left shoulder after a fall approximately 4 months ago. The patient continued to have persistent pain and difficulty in reaching and resting on the left shoulder.  The patient has been treated with anti-inflammatories, therapeutic injections.  The patient's pain persists and difficulty with reaching as well as resting on the left side.  PAST MEDICAL HISTORY:  Hypertension.  REVIEW OF SYSTEMS:  Back and left knee pain.  No cardiac or respiratory. No urinary or bowel symptoms.  FAMILY HISTORY:  Hypertension.  SOCIAL HISTORY:  Negative for alcohol, tobacco, or illegal drugs.  MEDICATIONS:  Diovan and Naprosyn.  ALLERGIES:  None known to any medications.  PHYSICAL EXAMINATION:  VITAL SIGNS:  Temperature 98.4, pulse 44, respirations 20, blood pressure 101/59, and O2 saturation 96%. HEAD:  Normocephalic. EYES:  Conjunctivae and sclerae clear. NECK:  Supple. CHEST:  Clear. CARDIAC:  S1 and S2 regular. EXTREMITIES:  Left shoulder tender anterior and lateral with subacromial crepitus.  Pain on abduction and above chest level and increased on resisted abduction.  MRI demonstrates type 2 acromion with supraspinatus tendinopathy.  IMPRESSION:  Impingement syndrome, supraspinatus tendinopathy; left shoulder.  PLAN:  Arthroscopic acromioplasty and synovectomy, left shoulder.     Myrtie Neither, MD     AC/MEDQ  D:  01/02/2011  T:  01/02/2011  Job:  [id number]

## 2011-01-02 NOTE — H&P (Signed)
  H&P HAS BEEN DICTATED REPORT # [id number].

## 2011-01-02 NOTE — Anesthesia Postprocedure Evaluation (Signed)
  Anesthesia Post-op Note  Patient: Kenneth Melendez  Procedure(s) Performed:  ARTHROSCOPY SHOULDER  Patient Location: PACU  Anesthesia Type: General  Level of Consciousness: awake, alert , oriented and patient cooperative  Airway and Oxygen Therapy: Patient Spontanous Breathing and Patient connected to nasal cannula oxygen  Post-op Pain: mild  Post-op Assessment: Post-op Vital signs reviewed, Patient's Cardiovascular Status Stable, Respiratory Function Stable, Patent Airway, No signs of Nausea or vomiting and Pain level controlled  Post-op Vital Signs: stable  Complications: No apparent anesthesia complications

## 2011-01-02 NOTE — Transfer of Care (Signed)
Immediate Anesthesia Transfer of Care Note  Patient: Kenneth Melendez  Procedure(s) Performed:  ARTHROSCOPY SHOULDER  Patient Location: PACU  Anesthesia Type: General  Level of Consciousness: awake, alert  and oriented  Airway & Oxygen Therapy: Patient Spontanous Breathing and Patient connected to nasal cannula oxygen  Post-op Assessment: Report given to PACU RN and Post -op Vital signs reviewed and stable  Post vital signs: Reviewed and stable  Complications: No apparent anesthesia complications

## 2011-01-02 NOTE — Anesthesia Procedure Notes (Signed)
Procedure Name: Intubation Date/Time: 01/02/2011 8:05 AM Performed by: Jefm Miles E Pre-anesthesia Checklist: Patient identified, Emergency Drugs available, Suction available, Patient being monitored and Timeout performed Patient Re-evaluated:Patient Re-evaluated prior to inductionOxygen Delivery Method: Circle System Utilized Preoxygenation: Pre-oxygenation with 100% oxygen Intubation Type: IV induction Ventilation: Mask ventilation without difficulty and Oral airway inserted - appropriate to patient size Laryngoscope Size: Mac and 4 Grade View: Grade I Tube type: Oral Tube size: 7.5 mm Number of attempts: 1 Airway Equipment and Method: stylet Placement Confirmation: ETT inserted through vocal cords under direct vision,  breath sounds checked- equal and bilateral,  positive ETCO2 and CO2 detector Secured at: 23 cm Tube secured with: Tape Dental Injury: Teeth and Oropharynx as per pre-operative assessment

## 2011-01-02 NOTE — Preoperative (Signed)
Beta Blockers   Reason not to administer Beta Blockers:Not Applicable 

## 2011-01-02 NOTE — Brief Op Note (Signed)
01/02/2011  9:26 AM  PATIENT:  Kenneth Melendez  55 y.o. male  PRE-OPERATIVE DIAGNOSIS:  Impingement Left Shoulder  POST-OPERATIVE DIAGNOSIS:  Impingement Left Shoulder  PROCEDURE:  Procedure(s): ARTHROSCOPY SHOULDER  SURGEON:  Surgeon(s): [name]  PHYSICIAN ASSISTANT:   ASSISTANTS: none   ANESTHESIA:   generalGENERAL  EBL:  Total I/O In: 600 [I.V.:600] Out: 50 [Blood:50]  BLOOD ADMINISTERED:noneNONE  DRAINS: none NONE  LOCAL MEDICATIONS USED:  MARCAINE 14CC  SPECIMEN:  No SpecimenNONE  DISPOSITION OF SPECIMEN:  N/ANONE COUNTS:  NO   TOURNIQUET:  * No tourniquets in log *NONE  DICTATION: .Other Dictation: Dictation Number [id number] REPORT #[id number]  PLAN OF CARE: Discharge to home after PACUARTHROSCOPICC ACROMIOPLASTY LEFT SHOULDER  PATIENT DISPOSITION:  PACU - hemodynamically stable.DISCHARGE TO HOME, RETURN TO OFFICE IN ONE WEEK.   Delay start of Pharmacological VTE agent (>24hrs) due to surgical blood loss or risk of bleeding:  NOT APPLICABLE

## 2011-01-02 NOTE — Interval H&P Note (Signed)
History and Physical Interval Note:   01/02/2011   7:45 AM   Kenneth Melendez  has presented today for surgery, with the diagnosis of Impingement Left Shoulder  The various methods of treatment have been discussed with the patient and family. After consideration of risks, benefits and other options for treatment, the patient has consented to  Procedure(s): ARTHROSCOPY SHOULDER as a surgical intervention .  The patients' history has been reviewed, patient examined, no change in status, stable for surgery.  I have reviewed the patients' chart and labs.  Questions were answered to the patient's satisfaction.     [name]  MD

## 2011-01-02 NOTE — Anesthesia Preprocedure Evaluation (Addendum)
Anesthesia Evaluation  Patient identified by MRN, date of birth, ID band Patient awake    Reviewed: Allergy & Precautions, H&P , NPO status , Patient's Chart, lab work & pertinent test results  Airway Mallampati: I TM Distance: >3 FB Neck ROM: Full    Dental No notable dental hx. (+) Teeth Intact and Dental Advisory Given   Pulmonary neg pulmonary ROS,    Pulmonary exam normal       Cardiovascular hypertension, neg cardio ROS regular Normal    Neuro/Psych  Headaches,    GI/Hepatic negative GI ROS, Neg liver ROS,   Endo/Other  Negative Endocrine ROS  Renal/GU negative Renal ROS     Musculoskeletal   Abdominal   Peds  Hematology   Anesthesia Other Findings   Reproductive/Obstetrics                          Anesthesia Physical Anesthesia Plan  ASA: I  Anesthesia Plan: General   Post-op Pain Management:    Induction: Intravenous  Airway Management Planned: Oral ETT  Additional Equipment:   Intra-op Plan:   Post-operative Plan: Extubation in OR  Informed Consent:   Plan Discussed with: Anesthesiologist, CRNA and Surgeon  Anesthesia Plan Comments:         Anesthesia Quick Evaluation

## 2011-01-02 NOTE — H&P (Signed)
NAME:  YOLTZIN, BARG NO.:  [id number]  MEDICAL RECORD NO.:  [id number]  LOCATION:  [facility]                         FACILITY:  [facility]  PHYSICIAN:  Myrtie Neither, MD      DATE OF BIRTH:  1955/07/10  DATE OF ADMISSION:  01/02/2011 DATE OF DISCHARGE:                             HISTORY & PHYSICAL   CHIEF COMPLAINT:  Painful left shoulder.  HISTORY OF PRESENT ILLNESS:  This is a 55 year old male who had sustained a fall 4 months ago and sustained injury to his left shoulder. The patient continued to have persistent pain, difficulty reaching at and above chest level.  Pain was resting on the left side.  The patient is being treated with anti-inflammatories, therapeutic injections with no improvement of symptoms.  PAST MEDICAL HISTORY:  Hypertension, high blood pressure.  SOCIAL HISTORY:  No history of alcohol, tobacco, or illegal drug use.  FAMILY HISTORY:  Hypertension.  MEDICATIONS: 1. Lodine 400 mg b.i.d. for 10 days. 2. Diovan daily. 3. Naprosyn 500 mg b.i.d.  MEDICATION ALLERGIES:  None known.  REVIEW OF SYSTEMS:  Some back as well as left knee pain from the same injury.  Otherwise, no cardiac, respiratory, no urinary or bowel symptoms.  PHYSICAL EXAMINATION:  VITAL SIGNS:  Temperature 98.4, pulse 44, respirations 20, blood pressure 101/59, O2 saturation 96%. HEENT:  Head normocephalic.  Eyes:  Conjunctivae are clear. NECK:  Supple. CHEST:  Clear. CARDIAC:  S1, S2 regular. MUSCULOSKELETAL:  Left shoulder, anterior and lateral subacromial crepitus and pain on abduction on resistance as well as on abduction above chest level.  MRI revealed supraspinatus tendinopathy, type 2 acromion.  IMPRESSION:  Impingement syndrome, supraspinatus tendinopathy, left shoulder.  PLAN:  Arthroscopy with acromioplasty and synovectomy.     Myrtie Neither, MD     AC/MEDQ  D:  01/02/2011  T:  01/02/2011  Job:  [id number]

## 2011-01-03 ENCOUNTER — Encounter (HOSPITAL_COMMUNITY): Payer: Self-pay | Admitting: Orthopedic Surgery

## 2011-01-03 NOTE — Op Note (Signed)
NAME:  SEIBERT, KEETER NO.:  [id number]  MEDICAL RECORD NO.:  [id number]  LOCATION:  [facility]                         FACILITY:  [facility]  PHYSICIAN:  Myrtie Neither, MD      DATE OF BIRTH:  10/16/1955  DATE OF PROCEDURE:  01/02/2011 DATE OF DISCHARGE:                              OPERATIVE REPORT   PREOPERATIVE DIAGNOSIS:  Impingement syndrome, left shoulder, supraspinatus tendinopathy, left shoulder.  POSTOPERATIVE DIAGNOSIS:  Impingement syndrome, left shoulder, supraspinatus tendinopathy, left shoulder, subacromial bursitis.  ANESTHESIA:  General.  PROCEDURE: 1. Arthroscopic complete synovectomy. 2. Arthroscopic acromioplasty, left shoulder.  DESCRIPTION OF PROCEDURE:  The patient was taken to the operating room. After given adequate preop medications, given general anesthesia and intubated.  Left shoulder was prepped with DuraPrep and draped in sterile manner.  The patient has been in a barber chair position.  1.5 inch puncture wound made posteriorly.  __________ was placed posterior to anterior, inflow portal was made through the anterior puncture wound. Lateral incision was made for the shaver.  Inspection revealed tremendous hypertrophic subacromial bursal thickening with chondromalacia changes of the subacromial surface with tendinopathy of supraspinatus but not complete tear.  Complete synovectomy was done with synovial shaver followed by acromioplasty along the anterior and anterolateral aspect of the acromion.  After adequate acromioplasty, subacromial space was regained and re-obtained.  Further debridement was done over the surface of the rotator cuff.  Further inspection did not reveal that the rotator cuff itself was cut into but not through. Copious antibiotic irrigation was then done followed by wound closure with use of 4-0 nylon.  A 14 mL of 0.25% with epinephrine and Marcaine was injected.  Compressive dressing was applied.  Sling was  applied. The patient tolerated the procedure quite well, went to recovery room in stable and satisfactory condition.  The patient being discharged home on Percocet 1-2 q.4 p.r.n. for pain, ice packs to return to the office in 1 week.  The patient is being discharged in stable and satisfactory condition.     Myrtie Neither, MD     AC/MEDQ  D:  01/02/2011  T:  01/02/2011  Job:  [id number]

## 2011-01-23 ENCOUNTER — Ambulatory Visit: Payer: 59 | Attending: Orthopedic Surgery | Admitting: Physical Therapy

## 2011-01-23 DIAGNOSIS — IMO0001 Reserved for inherently not codable concepts without codable children: Secondary | ICD-10-CM | POA: Insufficient documentation

## 2011-01-23 DIAGNOSIS — M25519 Pain in unspecified shoulder: Secondary | ICD-10-CM | POA: Insufficient documentation

## 2011-01-23 DIAGNOSIS — M25539 Pain in unspecified wrist: Secondary | ICD-10-CM | POA: Insufficient documentation

## 2011-01-25 ENCOUNTER — Ambulatory Visit: Payer: 59 | Admitting: Physical Therapy

## 2011-02-01 ENCOUNTER — Ambulatory Visit: Payer: 59 | Admitting: Physical Therapy

## 2011-02-04 ENCOUNTER — Encounter (HOSPITAL_BASED_OUTPATIENT_CLINIC_OR_DEPARTMENT_OTHER): Payer: Self-pay | Admitting: *Deleted

## 2011-02-04 ENCOUNTER — Other Ambulatory Visit: Payer: Self-pay

## 2011-02-04 ENCOUNTER — Emergency Department (HOSPITAL_BASED_OUTPATIENT_CLINIC_OR_DEPARTMENT_OTHER)
Admission: EM | Admit: 2011-02-04 | Discharge: 2011-02-05 | Disposition: A | Discharge status: Home / Self Care | Payer: 59 | Attending: Emergency Medicine | Admitting: Emergency Medicine

## 2011-02-04 DIAGNOSIS — R091 Pleurisy: Secondary | ICD-10-CM | POA: Insufficient documentation

## 2011-02-04 DIAGNOSIS — R079 Chest pain, unspecified: Secondary | ICD-10-CM | POA: Insufficient documentation

## 2011-02-04 DIAGNOSIS — I10 Essential (primary) hypertension: Secondary | ICD-10-CM | POA: Insufficient documentation

## 2011-02-04 LAB — BASIC METABOLIC PANEL
BUN: 23 mg/dL (ref 6–23)
CO2: 26 mEq/L (ref 19–32)
Calcium: 9.4 mg/dL (ref 8.4–10.5)
Chloride: 106 mEq/L (ref 96–112)
Creatinine, Ser: 1 mg/dL (ref 0.50–1.35)
GFR calc Af Amer: >90 mL/min (ref >90)
GFR calc non Af Amer: 82 mL/min — ABNORMAL LOW (ref >90)
Glucose, Bld: 130 mg/dL — ABNORMAL HIGH (ref 70–99)
Potassium: 3.7 mEq/L (ref 3.5–5.1)
Sodium: 142 mEq/L (ref 135–145)

## 2011-02-04 LAB — CBC
HCT: 42.3 % (ref 39.0–52.0)
Hemoglobin: 14.5 g/dL (ref 13.0–17.0)
MCH: 31 pg (ref 26.0–34.0)
MCHC: 34.3 g/dL (ref 30.0–36.0)
MCV: 90.4 fL (ref 78.0–100.0)
Platelets: 202 10*3/uL (ref 150–400)
RBC: 4.68 MIL/uL (ref 4.22–5.81)
RDW: 13.5 % (ref 11.5–15.5)
WBC: 5 10*3/uL (ref 4.0–10.5)

## 2011-02-04 LAB — DIFFERENTIAL
Basophils Absolute: 0 10*3/uL (ref 0.0–0.1)
Basophils Relative: 0 % (ref 0–1)
Eosinophils Absolute: 0.1 10*3/uL (ref 0.0–0.7)
Eosinophils Relative: 2 % (ref 0–5)
Lymphocytes Relative: 45 % (ref 12–46)
Lymphs Abs: 2.3 10*3/uL (ref 0.7–4.0)
Monocytes Absolute: 0.4 10*3/uL (ref 0.1–1.0)
Monocytes Relative: 8 % (ref 3–12)
Neutro Abs: 2.2 10*3/uL (ref 1.7–7.7)
Neutrophils Relative %: 44 % (ref 43–77)

## 2011-02-04 LAB — CARDIAC PANEL(CRET KIN+CKTOT+MB+TROPI)
CK, MB: 2.3 ng/mL (ref 0.3–4.0)
Relative Index: 1.4 (ref 0.0–2.5)
Total CK: 159 U/L (ref 7–232)
Troponin I: <0.3 ng/mL (ref <0.30)

## 2011-02-04 NOTE — ED Notes (Signed)
Pt states that he began having sharp left sided Cp that goes through to back and left shoulder states that pain is worse with deep breath and movement denies SOB N/V or diaphoresis

## 2011-02-05 ENCOUNTER — Emergency Department (INDEPENDENT_AMBULATORY_CARE_PROVIDER_SITE_OTHER): Payer: 59

## 2011-02-05 DIAGNOSIS — R0789 Other chest pain: Secondary | ICD-10-CM

## 2011-02-05 DIAGNOSIS — M25519 Pain in unspecified shoulder: Secondary | ICD-10-CM

## 2011-02-05 DIAGNOSIS — I10 Essential (primary) hypertension: Secondary | ICD-10-CM

## 2011-02-05 LAB — D-DIMER, QUANTITATIVE: D-Dimer, Quant: 0.64 ug/mL-FEU — ABNORMAL HIGH (ref 0.00–0.48)

## 2011-02-05 MED ORDER — KETOROLAC TROMETHAMINE 30 MG/ML IJ SOLN
30.0000 mg | Freq: Once | INTRAMUSCULAR | Status: AC
  Administered 2011-02-05: 30 mg via INTRAVENOUS
  Filled 2011-02-05: qty 1

## 2011-02-05 MED ORDER — TRAMADOL HCL 50 MG PO TABS: 50.0000 mg | ORAL_TABLET | Freq: Once | ORAL | Status: DC

## 2011-02-05 MED ORDER — IOHEXOL 350 MG/ML SOLN
80.0000 mL | Freq: Once | INTRAVENOUS | Status: AC | PRN
  Administered 2011-02-05: 80 mL via INTRAVENOUS

## 2011-02-05 MED ORDER — KETOROLAC TROMETHAMINE 30 MG/ML IJ SOLN: 30.0000 mg | Freq: Once | INTRAMUSCULAR | Status: DC

## 2011-02-05 MED ORDER — TRAMADOL HCL 50 MG PO TABS: 50.0000 mg | ORAL_TABLET | Freq: Four times a day (QID) | ORAL | Status: AC | PRN

## 2011-02-05 MED ORDER — IBUPROFEN 800 MG PO TABS: 800.0000 mg | ORAL_TABLET | Freq: Three times a day (TID) | ORAL | Status: DC

## 2011-02-05 MED ORDER — TRAMADOL HCL 50 MG PO TABS
50.0000 mg | ORAL_TABLET | Freq: Once | ORAL | Status: AC
  Administered 2011-02-05: 50 mg via ORAL
  Filled 2011-02-05: qty 1

## 2011-02-05 MED ORDER — SILVER SULFADIAZINE 1 % EX CREA: TOPICAL_CREAM | Freq: Every day | CUTANEOUS | Status: AC

## 2011-02-05 MED ORDER — IBUPROFEN 800 MG PO TABS: 800.0000 mg | ORAL_TABLET | Freq: Three times a day (TID) | ORAL | Status: AC

## 2011-02-05 MED ORDER — TRAMADOL HCL 50 MG PO TABS: 50.0000 mg | ORAL_TABLET | Freq: Four times a day (QID) | ORAL | Status: DC | PRN

## 2011-02-05 NOTE — ED Notes (Signed)
Returned from Ct scan  

## 2011-02-05 NOTE — ED Provider Notes (Addendum)
History  This chart was scribed for Nelani Schmelzle Smitty Cords, MD by Bennett Scrape. This patient was seen in room MHT14/[facility]14 and the patient's care was started at 12:23AM.  CSN: [id number]  Arrival date & time 02/04/11  2241   First MD Initiated Contact with Patient 02/05/11 0002      Chief Complaint  Patient presents with  . Chest Pain    Patient is a 55 y.o. male presenting with back pain. The history is provided by the patient. No language interpreter was used.  Back Pain  This is a new problem. The current episode started 12 to 24 hours ago. The problem occurs constantly. The problem has been gradually worsening. The pain is associated with no known injury. The pain is present in the lumbar spine. The quality of the pain is described as stabbing. The symptoms are aggravated by bending and twisting. The pain is the same all the time. Pertinent negatives include no chest pain, no fever, no numbness, no abdominal pain, no abdominal swelling, no dysuria, no leg pain, no tingling and no weakness. He has tried heat for the symptoms. The treatment provided mild relief.    Kenneth Melendez is a 55 y.o. male who presents to the Emergency Department complaining of 2 days  of left-sided, lower back pain described as sharp that radiates to his left shoulder and left chest. He rates his pain a 7 out of 10 currently. Pt states that the pain is worse with deep breathes and movement. He denies any recent heavy lifting or repetitive motions as the cause of the symptoms. He denies any recent long trips or illnesses. He denies SOB, nausea, vomiting and diaphoresis as associated symptoms. Pt has a h/o HTN and takes medications regularly for this condition.      Past Medical History  Diagnosis Date  . Hypertension   . Headache     h/o migraines, unsure of medicine but says he takes something for maintainance    . Herniated disc     Past Surgical History  Procedure Date  . Appendectomy   . Hernia  repair     inguinal 1995& abdominal 2011  . Shoulder arthroscopy 01/02/2011    Procedure: ARTHROSCOPY SHOULDER;  Surgeon: [name];  Location: [facility] OR;  Service: Orthopedics;  Laterality: Left;    Family History  Problem Relation Age of Onset  . Anesthesia problems Neg Hx   . Hypotension Neg Hx   . Malignant hyperthermia Neg Hx   . Pseudochol deficiency Neg Hx     History  Substance Use Topics  . Smoking status: Never Smoker   . Smokeless tobacco: Not on file  . Alcohol Use: Yes     social alcohol on monthly basis       Review of Systems  Constitutional: Negative for fever and chills.  HENT: Negative for congestion and sore throat.   Eyes: Negative for pain and redness.  Respiratory: Negative for cough and shortness of breath.   Cardiovascular: Negative for chest pain and leg swelling.  Gastrointestinal: Negative for nausea, vomiting, abdominal pain and diarrhea.  Genitourinary: Negative for dysuria and urgency.  Musculoskeletal: Positive for back pain. Negative for myalgias.  Skin: Negative for rash.  Neurological: Negative for tingling, weakness and numbness.    Allergies  Review of patient's allergies indicates no known allergies.  Home Medications   Current Outpatient Rx  Name Route Sig Dispense Refill  . CINNAMON 500 MG PO CAPS Oral Take 500 mg by mouth  2 (two) times daily.      . ETODOLAC ER 400 MG PO TB24 Oral Take 400 mg by mouth 2 (two) times daily.      . ADULT MULTIVITAMIN W/MINERALS CH Oral Take 1 tablet by mouth daily.      . OMEGA-3-ACID ETHYL ESTERS 1 G PO CAPS Oral Take 2 g by mouth 2 (two) times daily.      . TOPIRAMATE 50 MG PO TABS Oral Take 50 mg by mouth daily.      Marland Kitchen VALSARTAN-HYDROCHLOROTHIAZIDE 160-25 MG PO TABS Oral Take 1 tablet by mouth daily.      Marland Kitchen ONDANSETRON 4 MG PO TBDP Oral Take 4 mg by mouth every 8 (eight) hours as needed. For nausea     . SUMATRIPTAN SUCCINATE 100 MG PO TABS Oral Take 100 mg by mouth every 2 (two) hours as  needed. For migraine       Triage Vitals: BP 121/82  Pulse 58  Temp(Src) 97.4 F (36.3 C) (Oral)  Resp 20  SpO2 96%  Physical Exam  Nursing note and vitals reviewed. Constitutional: He is oriented to person, place, and time. He appears well-developed and well-nourished.  HENT:  Head: Normocephalic and atraumatic.  Mouth/Throat: Oropharynx is clear and moist.       Moist mucous membranes  Eyes: Conjunctivae and EOM are normal. Pupils are equal, round, and reactive to light.  Neck: Normal range of motion. Neck supple. No tracheal deviation present.  Cardiovascular: Normal rate, regular rhythm and normal heart sounds.  Exam reveals no friction rub.   Pulmonary/Chest: Effort normal and breath sounds normal. No respiratory distress.  Abdominal: Soft. Bowel sounds are normal. There is no tenderness.  Musculoskeletal: Normal range of motion. He exhibits no edema.       Tenderness with palpation around the T11/T12 area, no crepitus, no step-offs, no tenderness with palpation  Neurological: He is alert and oriented to person, place, and time. No cranial nerve deficit.  Skin: Skin is warm and dry.  Psychiatric: He has a normal mood and affect. His behavior is normal.  Small area of partial thickness second degree burn on right mid back  ED Course  Procedures (including critical care time)  DIAGNOSTIC STUDIES: Oxygen Saturation is 96% on room air, adequate by my interpretation.    COORDINATION OF CARE: 12:25AM-Discussed treatment plan with pt at bedside and pt agreed to plan.   Labs Reviewed  BASIC METABOLIC PANEL - Abnormal; Notable for the following:    Glucose, Bld 130 (*)    GFR calc non Af Amer 82 (*)    All other components within normal limits  CBC  DIFFERENTIAL  CARDIAC PANEL(CRET KIN+CKTOT+MB+TROPI)   No results found.   No diagnosis found.    MDM  Follow up for recheck with your family doctor in 3 days return for worsening symptoms or any concerns.  Patient  verbalizes understanding    Date: 02/05/2011  Rate:46  Rhythm: sinus bradycardia  QRS Axis: normal  Intervals: normal  ST/T Wave abnormalities: normal  Conduction Disutrbances:none  Narrative Interpretation:   Old EKG Reviewed: none available      Mihir Flanigan K Monalisa Bayless-Rasch, MD 02/05/11 0229  Jasmine Awe, MD 02/05/11 0230

## 2011-02-08 ENCOUNTER — Ambulatory Visit: Payer: 59 | Attending: Orthopedic Surgery | Admitting: Physical Therapy

## 2011-02-08 DIAGNOSIS — M25519 Pain in unspecified shoulder: Secondary | ICD-10-CM | POA: Insufficient documentation

## 2011-02-08 DIAGNOSIS — IMO0001 Reserved for inherently not codable concepts without codable children: Secondary | ICD-10-CM | POA: Insufficient documentation

## 2011-02-08 DIAGNOSIS — M25539 Pain in unspecified wrist: Secondary | ICD-10-CM | POA: Insufficient documentation

## 2011-02-10 ENCOUNTER — Ambulatory Visit: Payer: 59 | Admitting: Physical Therapy

## 2011-02-13 ENCOUNTER — Ambulatory Visit: Payer: 59 | Admitting: Physical Therapy

## 2011-02-15 ENCOUNTER — Ambulatory Visit: Payer: 59 | Admitting: Physical Therapy

## 2011-02-20 ENCOUNTER — Ambulatory Visit: Payer: 59 | Admitting: Physical Therapy

## 2011-02-22 ENCOUNTER — Encounter: Payer: 59 | Admitting: Physical Therapy

## 2011-03-01 ENCOUNTER — Ambulatory Visit: Payer: 59 | Admitting: Physical Therapy

## 2011-03-03 ENCOUNTER — Ambulatory Visit: Payer: 59 | Admitting: Physical Therapy

## 2011-03-06 ENCOUNTER — Ambulatory Visit: Payer: 59 | Admitting: Physical Therapy

## 2011-03-08 ENCOUNTER — Ambulatory Visit: Payer: 59 | Admitting: Rehabilitation

## 2011-08-17 ENCOUNTER — Emergency Department (HOSPITAL_BASED_OUTPATIENT_CLINIC_OR_DEPARTMENT_OTHER)
Admission: EM | Admit: 2011-08-17 | Discharge: 2011-08-17 | Disposition: A | Discharge status: Home / Self Care | Payer: 59 | Attending: Emergency Medicine | Admitting: Emergency Medicine

## 2011-08-17 ENCOUNTER — Encounter (HOSPITAL_BASED_OUTPATIENT_CLINIC_OR_DEPARTMENT_OTHER): Payer: Self-pay | Admitting: *Deleted

## 2011-08-17 DIAGNOSIS — I10 Essential (primary) hypertension: Secondary | ICD-10-CM | POA: Insufficient documentation

## 2011-08-17 DIAGNOSIS — R112 Nausea with vomiting, unspecified: Secondary | ICD-10-CM | POA: Insufficient documentation

## 2011-08-17 DIAGNOSIS — H53149 Visual discomfort, unspecified: Secondary | ICD-10-CM | POA: Insufficient documentation

## 2011-08-17 DIAGNOSIS — G43909 Migraine, unspecified, not intractable, without status migrainosus: Secondary | ICD-10-CM | POA: Insufficient documentation

## 2011-08-17 MED ORDER — MORPHINE SULFATE 4 MG/ML IJ SOLN
4.0000 mg | Freq: Once | INTRAMUSCULAR | Status: AC
  Administered 2011-08-17: 4 mg via INTRAVENOUS
  Filled 2011-08-17: qty 1

## 2011-08-17 MED ORDER — KETOROLAC TROMETHAMINE 30 MG/ML IJ SOLN
30.0000 mg | Freq: Once | INTRAMUSCULAR | Status: AC
  Administered 2011-08-17: 30 mg via INTRAVENOUS
  Filled 2011-08-17: qty 1

## 2011-08-17 MED ORDER — METOCLOPRAMIDE HCL 5 MG/ML IJ SOLN
10.0000 mg | Freq: Once | INTRAMUSCULAR | Status: AC
  Administered 2011-08-17: 10 mg via INTRAVENOUS
  Filled 2011-08-17: qty 2

## 2011-08-17 MED ORDER — TRAMADOL HCL 50 MG PO TABS: 50.0000 mg | ORAL_TABLET | Freq: Four times a day (QID) | ORAL | Status: AC | PRN

## 2011-08-17 MED ORDER — SODIUM CHLORIDE 0.9 % IV BOLUS (SEPSIS)
1000.0000 mL | Freq: Once | INTRAVENOUS | Status: AC
  Administered 2011-08-17: 1000 mL via INTRAVENOUS

## 2011-08-17 MED ORDER — IBUPROFEN 600 MG PO TABS: 600.0000 mg | ORAL_TABLET | Freq: Four times a day (QID) | ORAL | Status: AC | PRN

## 2011-08-17 MED ORDER — METOCLOPRAMIDE HCL 10 MG PO TABS: 10.0000 mg | ORAL_TABLET | Freq: Four times a day (QID) | ORAL | Status: DC

## 2011-08-17 NOTE — ED Provider Notes (Addendum)
History     CSN: [id number]  Arrival date & time 08/17/11  4098   First MD Initiated Contact with Patient 08/17/11 1017      Chief Complaint  Patient presents with  . Headache  . Nausea  . Emesis    (Consider location/radiation/quality/duration/timing/severity/associated sxs/prior treatment) HPI Pt has long history of migraines, followed by neurologist. States he started have R fontal HA with photophobia, nausea and vomiting yesterday. No fever, chills, neck pain. Pain was gradual onset and similar to previous migraines. No focal weakness, numbness. Has been out of his Topamax which he takes for migraine prevention  Past Medical History  Diagnosis Date  . Hypertension   . Headache     h/o migraines, unsure of medicine but says he takes something for maintainance    . Herniated disc     Past Surgical History  Procedure Date  . Appendectomy   . Hernia repair     inguinal 1995& abdominal 2011  . Shoulder arthroscopy 01/02/2011    Procedure: ARTHROSCOPY SHOULDER;  Surgeon: [name];  Location: [facility] OR;  Service: Orthopedics;  Laterality: Left;    Family History  Problem Relation Age of Onset  . Anesthesia problems Neg Hx   . Hypotension Neg Hx   . Malignant hyperthermia Neg Hx   . Pseudochol deficiency Neg Hx     History  Substance Use Topics  . Smoking status: Never Smoker   . Smokeless tobacco: Not on file  . Alcohol Use: Yes     social alcohol on monthly basis       Review of Systems  Constitutional: Negative for fever and chills.  HENT: Negative for congestion, sore throat, rhinorrhea, neck pain, neck stiffness and sinus pressure.   Eyes: Positive for photophobia. Negative for visual disturbance.  Respiratory: Negative for shortness of breath.   Cardiovascular: Negative for chest pain.  Gastrointestinal: Positive for nausea and vomiting. Negative for abdominal pain.  Skin: Negative for rash.  Neurological: Positive for headaches. Negative for  dizziness, weakness and numbness.    Allergies  Review of patient's allergies indicates no known allergies.  Home Medications   Current Outpatient Rx  Name Route Sig Dispense Refill  . CINNAMON 500 MG PO CAPS Oral Take 500 mg by mouth 2 (two) times daily.      . ETODOLAC ER 400 MG PO TB24 Oral Take 400 mg by mouth 2 (two) times daily.      . IBUPROFEN 600 MG PO TABS Oral Take 1 tablet (600 mg total) by mouth every 6 (six) hours as needed for pain. 30 tablet 0  . METOCLOPRAMIDE HCL 10 MG PO TABS Oral Take 1 tablet (10 mg total) by mouth every 6 (six) hours. 30 tablet 0  . ADULT MULTIVITAMIN W/MINERALS CH Oral Take 1 tablet by mouth daily.      . OMEGA-3-ACID ETHYL ESTERS 1 G PO CAPS Oral Take 2 g by mouth 2 (two) times daily.      Marland Kitchen ONDANSETRON 4 MG PO TBDP Oral Take 4 mg by mouth every 8 (eight) hours as needed. For nausea     . SILVER SULFADIAZINE 1 % EX CREA Topical Apply topically daily. 50 g 0  . SUMATRIPTAN SUCCINATE 100 MG PO TABS Oral Take 100 mg by mouth every 2 (two) hours as needed. For migraine     . TOPIRAMATE 50 MG PO TABS Oral Take 50 mg by mouth daily.      . TRAMADOL HCL 50 MG PO  TABS Oral Take 1 tablet (50 mg total) by mouth every 6 (six) hours as needed for pain. 15 tablet 0  . VALSARTAN-HYDROCHLOROTHIAZIDE 160-25 MG PO TABS Oral Take 1 tablet by mouth daily.        BP 126/81  Pulse 59  Temp 98 F (36.7 C) (Oral)  Resp 20  Ht 6\' 4"  (1.93 m)  Wt 223 lb (101.152 kg)  BMI 27.14 kg/m2  SpO2 100%  Physical Exam  Nursing note and vitals reviewed. Constitutional: He is oriented to person, place, and time. He appears well-developed and well-nourished. No distress.       Pt lying in a dark room with eyes closed  HENT:  Head: Normocephalic and atraumatic.  Mouth/Throat: Oropharynx is clear and moist.       No sinus or temporal artery tenderness  Eyes: EOM are normal. Pupils are equal, round, and reactive to light.  Neck: Normal range of motion. Neck supple.        No meningismus   Cardiovascular: Normal rate and regular rhythm.   Pulmonary/Chest: Effort normal and breath sounds normal. No respiratory distress. He has no wheezes. He has no rales.  Abdominal: Soft. Bowel sounds are normal. There is no tenderness. There is no rebound and no guarding.  Musculoskeletal: Normal range of motion. He exhibits no edema and no tenderness.  Neurological: He is alert and oriented to person, place, and time.       5/5 motor, sensation intact.   Skin: Skin is warm and dry. No rash noted. No erythema.  Psychiatric: He has a normal mood and affect. His behavior is normal.    ED Course  Procedures (including critical care time)  Labs Reviewed - No data to display No results found.   1. Migraine       MDM   Pt states he is feeling much better and is asking to d/c home.        Loren Racer, MD 08/17/11 1243  Loren Racer, MD 08/17/11 1244

## 2011-08-17 NOTE — ED Notes (Signed)
Pt requests d/c home. md informed.

## 2011-08-17 NOTE — ED Notes (Signed)
PT REQUESTS D/C HOME, MD INFORMED. 

## 2011-08-17 NOTE — ED Notes (Signed)
Pt requests d/c home, md informed.

## 2011-08-17 NOTE — ED Notes (Signed)
Headache started 2 days ago getting progressively worse with nausea and vomiting states started as a "sinus headache" thinks progressed to migraine as he has had migraines before

## 2012-07-29 ENCOUNTER — Encounter (HOSPITAL_BASED_OUTPATIENT_CLINIC_OR_DEPARTMENT_OTHER): Payer: Self-pay | Admitting: *Deleted

## 2012-07-29 ENCOUNTER — Emergency Department (HOSPITAL_BASED_OUTPATIENT_CLINIC_OR_DEPARTMENT_OTHER)
Admission: EM | Admit: 2012-07-29 | Discharge: 2012-07-29 | Disposition: A | Discharge status: Home / Self Care | Payer: BC Managed Care – PPO | Attending: Emergency Medicine | Admitting: Emergency Medicine

## 2012-07-29 DIAGNOSIS — G43909 Migraine, unspecified, not intractable, without status migrainosus: Secondary | ICD-10-CM

## 2012-07-29 DIAGNOSIS — Z79899 Other long term (current) drug therapy: Secondary | ICD-10-CM | POA: Insufficient documentation

## 2012-07-29 DIAGNOSIS — Z8739 Personal history of other diseases of the musculoskeletal system and connective tissue: Secondary | ICD-10-CM | POA: Insufficient documentation

## 2012-07-29 DIAGNOSIS — I10 Essential (primary) hypertension: Secondary | ICD-10-CM | POA: Insufficient documentation

## 2012-07-29 DIAGNOSIS — R112 Nausea with vomiting, unspecified: Secondary | ICD-10-CM | POA: Insufficient documentation

## 2012-07-29 DIAGNOSIS — H53149 Visual discomfort, unspecified: Secondary | ICD-10-CM | POA: Insufficient documentation

## 2012-07-29 DIAGNOSIS — M549 Dorsalgia, unspecified: Secondary | ICD-10-CM | POA: Insufficient documentation

## 2012-07-29 HISTORY — DX: Migraine, unspecified, not intractable, without status migrainosus: G43.909

## 2012-07-29 MED ORDER — METOCLOPRAMIDE HCL 5 MG/ML IJ SOLN
10.0000 mg | Freq: Once | INTRAMUSCULAR | Status: AC
  Administered 2012-07-29: 10 mg via INTRAVENOUS
  Filled 2012-07-29: qty 2

## 2012-07-29 MED ORDER — KETOROLAC TROMETHAMINE 30 MG/ML IJ SOLN
30.0000 mg | Freq: Once | INTRAMUSCULAR | Status: AC
  Administered 2012-07-29: 30 mg via INTRAVENOUS
  Filled 2012-07-29: qty 1

## 2012-07-29 MED ORDER — DEXAMETHASONE SODIUM PHOSPHATE 10 MG/ML IJ SOLN
10.0000 mg | Freq: Once | INTRAMUSCULAR | Status: AC
  Administered 2012-07-29: 10 mg via INTRAVENOUS
  Filled 2012-07-29: qty 1

## 2012-07-29 MED ORDER — DIPHENHYDRAMINE HCL 50 MG/ML IJ SOLN
25.0000 mg | Freq: Once | INTRAMUSCULAR | Status: AC
  Administered 2012-07-29: 25 mg via INTRAVENOUS
  Filled 2012-07-29: qty 1

## 2012-07-29 MED ORDER — SODIUM CHLORIDE 0.9 % IV BOLUS (SEPSIS)
1000.0000 mL | Freq: Once | INTRAVENOUS | Status: AC
  Administered 2012-07-29: 1000 mL via INTRAVENOUS

## 2012-07-29 NOTE — ED Provider Notes (Signed)
History     CSN: [id number]  Arrival date & time 07/29/12  1191   First MD Initiated Contact with Patient 07/29/12 [phone]      Chief Complaint  Patient presents with  . Migraine    (Consider location/radiation/quality/duration/timing/severity/associated sxs/prior treatment) HPI Comments: Patient is a 57 year old male who presents today with a migraine headache since yesterday. He has a history of migraines and this feels like his typical headache. The headache has a piercing quality and is on the right side of his head. It began gradually and was associated with an aura. He has had associated nausea and vomiting. Vomitus is nonbloody. He took Topamax without relief. He has been trying to lay in dark rooms with his eyes closed. He is followed by a neurologist for these headaches. No recent illness, fevers, chills, focal weakness, numbness, tingling, paresthesias.   The history is provided by the patient. No language interpreter was used.    Past Medical History  Diagnosis Date  . Hypertension   . Headache     h/o migraines, unsure of medicine but says he takes something for maintainance    . Herniated disc     Past Surgical History  Procedure Laterality Date  . Appendectomy    . Hernia repair      inguinal 1995& abdominal 2011  . Shoulder arthroscopy  01/02/2011    Procedure: ARTHROSCOPY SHOULDER;  Surgeon: [name];  Location: [facility] OR;  Service: Orthopedics;  Laterality: Left;    Family History  Problem Relation Age of Onset  . Anesthesia problems Neg Hx   . Hypotension Neg Hx   . Malignant hyperthermia Neg Hx   . Pseudochol deficiency Neg Hx     History  Substance Use Topics  . Smoking status: Never Smoker   . Smokeless tobacco: Not on file  . Alcohol Use: Yes     Comment: social alcohol on monthly basis       Review of Systems  Constitutional: Negative for fever and chills.  Eyes: Positive for photophobia. Negative for visual disturbance.  Respiratory:  Negative for shortness of breath.   Cardiovascular: Negative for chest pain.  Gastrointestinal: Positive for nausea and vomiting. Negative for abdominal pain.  Musculoskeletal: Positive for back pain (x 2 weeks).  Neurological: Positive for headaches.  All other systems reviewed and are negative.    Allergies  Review of patient's allergies indicates no known allergies.  Home Medications   Current Outpatient Rx  Name  Route  Sig  Dispense  Refill  . Cinnamon 500 MG capsule   Oral   Take 500 mg by mouth 2 (two) times daily.           Marland Kitchen etodolac (LODINE XL) 400 MG 24 hr tablet   Oral   Take 400 mg by mouth 2 (two) times daily.           Marland Kitchen EXPIRED: metoCLOPramide (REGLAN) 10 MG tablet   Oral   Take 1 tablet (10 mg total) by mouth every 6 (six) hours.   30 tablet   0   . Multiple Vitamin (MULITIVITAMIN WITH MINERALS) TABS   Oral   Take 1 tablet by mouth daily.           Marland Kitchen omega-3 acid ethyl esters (LOVAZA) 1 G capsule   Oral   Take 2 g by mouth 2 (two) times daily.           . ondansetron (ZOFRAN-ODT) 4 MG disintegrating tablet  Oral   Take 4 mg by mouth every 8 (eight) hours as needed. For nausea          . SUMAtriptan (IMITREX) 100 MG tablet   Oral   Take 100 mg by mouth every 2 (two) hours as needed. For migraine          . topiramate (TOPAMAX) 50 MG tablet   Oral   Take 50 mg by mouth daily.           . valsartan-hydrochlorothiazide (DIOVAN-HCT) 160-25 MG per tablet   Oral   Take 1 tablet by mouth daily.             BP 144/88  Pulse 70  Temp(Src) 98.2 F (36.8 C) (Oral)  Resp 18  Ht 6\' 3"  (1.905 m)  Wt 220 lb (99.791 kg)  BMI 27.5 kg/m2  SpO2 99%  Physical Exam  Nursing note and vitals reviewed. Constitutional: He is oriented to person, place, and time. Vital signs are normal. He appears well-developed and well-nourished. No distress.  Wearing sunglasses  HENT:  Head: Normocephalic and atraumatic.  Right Ear: External ear normal.   Left Ear: External ear normal.  Nose: Nose normal.  No temporal or sinus tenderness  Eyes: Conjunctivae and EOM are normal. Pupils are equal, round, and reactive to light.  Neck: Trachea normal, normal range of motion and phonation normal. No rigidity. No tracheal deviation present.  No nuchal rigidity or meningeal signs  Cardiovascular: Normal rate, regular rhythm and normal heart sounds.   Pulmonary/Chest: Effort normal and breath sounds normal. No stridor.  Abdominal: Soft. He exhibits no distension. There is no tenderness.  Musculoskeletal: Normal range of motion.  Neurological: He is alert and oriented to person, place, and time. He has normal strength. No sensory deficit. Coordination normal.  Skin: Skin is warm and dry. He is not diaphoretic.  Psychiatric: He has a normal mood and affect. His behavior is normal.    ED Course  Procedures (including critical care time)  Labs Reviewed - No data to display No results found.  11:46: patient states he feels better and would like to go home.    1. Migraine       MDM  Pt HA treated and improved while in ED.  Presentation is like pts typical HA and non concerning for [facility], ICH, Meningitis, or temporal arteritis. Pt is afebrile with no focal neuro deficits, nuchal rigidity, or change in vision. Pt is to follow up with PCP to discuss prophylactic medication. Pt verbalizes understanding and is agreeable with plan to dc.   Medications  ketorolac (TORADOL) 30 MG/ML injection 30 mg (30 mg Intravenous Given 07/29/12 1024)  metoCLOPramide (REGLAN) injection 10 mg (10 mg Intravenous Given 07/29/12 1024)  diphenhydrAMINE (BENADRYL) injection 25 mg (25 mg Intravenous Given 07/29/12 1024)  sodium chloride 0.9 % bolus 1,000 mL (1,000 mLs Intravenous New Bag/Given 07/29/12 1020)  dexamethasone (DECADRON) injection 10 mg (10 mg Intravenous Given 07/29/12 1118)          Mora Bellman, PA-C 07/29/12 1151

## 2012-07-29 NOTE — ED Notes (Signed)
Pt states migraine headache started yesterday has had nausea and vomiting and light sensitivity as well.

## 2012-07-30 NOTE — ED Provider Notes (Signed)
I have reviewed the report and personally reviewed the above radiology studies.    Amanda Pote S Munira Polson, MD 07/30/12 0840 

## 2012-07-31 ENCOUNTER — Other Ambulatory Visit: Payer: Self-pay | Admitting: Orthopedic Surgery

## 2012-07-31 ENCOUNTER — Ambulatory Visit
Admission: RE | Admit: 2012-07-31 | Discharge: 2012-07-31 | Disposition: A | Discharge status: Home / Self Care | Payer: BC Managed Care – PPO | Source: Ambulatory Visit | Attending: Orthopedic Surgery | Admitting: Orthopedic Surgery

## 2012-07-31 DIAGNOSIS — M5430 Sciatica, unspecified side: Secondary | ICD-10-CM

## 2012-08-16 ENCOUNTER — Other Ambulatory Visit: Payer: Self-pay | Admitting: Orthopedic Surgery

## 2012-08-16 DIAGNOSIS — M545 Low back pain, unspecified: Secondary | ICD-10-CM

## 2012-08-21 ENCOUNTER — Other Ambulatory Visit: Payer: BC Managed Care – PPO

## 2012-08-23 ENCOUNTER — Ambulatory Visit
Admission: RE | Admit: 2012-08-23 | Discharge: 2012-08-23 | Disposition: A | Discharge status: Home / Self Care | Payer: BC Managed Care – PPO | Source: Ambulatory Visit | Attending: Orthopedic Surgery | Admitting: Orthopedic Surgery

## 2012-08-23 DIAGNOSIS — M545 Low back pain, unspecified: Secondary | ICD-10-CM

## 2012-09-04 ENCOUNTER — Other Ambulatory Visit: Payer: Self-pay | Admitting: Orthopedic Surgery

## 2012-09-04 DIAGNOSIS — M5126 Other intervertebral disc displacement, lumbar region: Secondary | ICD-10-CM

## 2012-09-05 ENCOUNTER — Telehealth (INDEPENDENT_AMBULATORY_CARE_PROVIDER_SITE_OTHER): Payer: Self-pay

## 2012-09-05 NOTE — Telephone Encounter (Signed)
Patient called in asking to speak with Dr Carollee Massed nurse. Would not let me help him.

## 2012-09-05 NOTE — Telephone Encounter (Signed)
I called the pt back.  He has an appointment with Dr Maisie Fus 8/7.  He wanted to come in sooner due to hemorrhoid pain.  I move him up to 8/4 at 2:10

## 2012-09-06 ENCOUNTER — Ambulatory Visit
Admission: RE | Admit: 2012-09-06 | Discharge: 2012-09-06 | Disposition: A | Discharge status: Home / Self Care | Payer: BC Managed Care – PPO | Source: Ambulatory Visit | Attending: Orthopedic Surgery | Admitting: Orthopedic Surgery

## 2012-09-06 DIAGNOSIS — M5126 Other intervertebral disc displacement, lumbar region: Secondary | ICD-10-CM

## 2012-09-06 MED ORDER — METHYLPREDNISOLONE ACETATE 40 MG/ML INJ SUSP (RADIOLOG
120.0000 mg | Freq: Once | INTRAMUSCULAR | Status: AC
  Administered 2012-09-06: 120 mg via EPIDURAL

## 2012-09-06 MED ORDER — IOHEXOL 180 MG/ML  SOLN
1.0000 mL | Freq: Once | INTRAMUSCULAR | Status: AC | PRN
  Administered 2012-09-06: 1 mL via EPIDURAL

## 2012-09-09 ENCOUNTER — Encounter (INDEPENDENT_AMBULATORY_CARE_PROVIDER_SITE_OTHER): Payer: Self-pay | Admitting: General Surgery

## 2012-09-09 ENCOUNTER — Ambulatory Visit (INDEPENDENT_AMBULATORY_CARE_PROVIDER_SITE_OTHER): Payer: BC Managed Care – PPO | Admitting: General Surgery

## 2012-09-09 VITALS — BP 102/58 | HR 60 | Resp 16 | Ht 76.0 in | Wt 222.2 lb

## 2012-09-09 DIAGNOSIS — K644 Residual hemorrhoidal skin tags: Secondary | ICD-10-CM

## 2012-09-09 NOTE — Progress Notes (Signed)
Chief Complaint  Patient presents with  . New Evaluation    re/eval hems    HISTORY: Kenneth Melendez is a 57 y.o. male who presents to the office with rectal pain.  Other symptoms include min bleeding.  This had been occurring for about 2 weeks.  He has tried anusol in the past with little success.  Sitting and BM's makes the symptoms worse.   It is continuous in nature.  His bowel habits are regular and his bowel movements are usually soft.  His fiber intake is minimal and dietary.  His last colonoscopy was about 10 yrs ago with Dr Kenneth Melendez, and he is due again now.  This is scheduled for next month.    Past Medical History  Diagnosis Date  . Hypertension   . Headache(784.0)     h/o migraines, unsure of medicine but says he takes something for maintainance    . Herniated disc   . Migraine       Past Surgical History  Procedure Laterality Date  . Appendectomy    . Hernia repair      inguinal 1995& abdominal 2011  . Shoulder arthroscopy  01/02/2011    Procedure: ARTHROSCOPY SHOULDER;  Surgeon: Kenneth Melendez;  Location: [facility] OR;  Service: Orthopedics;  Laterality: Left;        Current Outpatient Prescriptions  Medication Sig Dispense Refill  . ANUSOL-HC 25 MG suppository       . Cinnamon 500 MG capsule Take 500 mg by mouth 2 (two) times daily.        Marland Kitchen dexamethasone (DECADRON) 4 MG tablet       . etodolac (LODINE XL) 400 MG 24 hr tablet Take 400 mg by mouth 2 (two) times daily.        . Multiple Vitamin (MULITIVITAMIN WITH MINERALS) TABS Take 1 tablet by mouth daily.        . ondansetron (ZOFRAN-ODT) 4 MG disintegrating tablet Take 4 mg by mouth every 8 (eight) hours as needed. For nausea       . SUMAtriptan (IMITREX) 100 MG tablet Take 100 mg by mouth every 2 (two) hours as needed. For migraine       . topiramate (TOPAMAX) 50 MG tablet Take 50 mg by mouth daily.        . valsartan-hydrochlorothiazide (DIOVAN-HCT) 160-25 MG per tablet Take 1 tablet by mouth daily.        .  metoCLOPramide (REGLAN) 10 MG tablet Take 1 tablet (10 mg total) by mouth every 6 (six) hours.  30 tablet  0  . omega-3 acid ethyl esters (LOVAZA) 1 G capsule Take 2 g by mouth 2 (two) times daily.        Marland Kitchen oxyCODONE-acetaminophen (PERCOCET/ROXICET) 5-325 MG per tablet        No current facility-administered medications for this visit.      No Known Allergies    Family History  Problem Relation Age of Onset  . Anesthesia problems Neg Hx   . Hypotension Neg Hx   . Malignant hyperthermia Neg Hx   . Pseudochol deficiency Neg Hx     History   Social History  . Marital Status: Married    Spouse Name: Kenneth Melendez    Number of Children: Kenneth Melendez  . Years of Education: Kenneth Melendez   Social History Main Topics  . Smoking status: Never Smoker   . Smokeless tobacco: None  . Alcohol Use: Yes     Comment: social alcohol on monthly basis   .  Drug Use: No  . Sexually Active: None   Other Topics Concern  . None   Social History Narrative  . None      REVIEW OF SYSTEMS - PERTINENT POSITIVES ONLY: Review of Systems - General ROS: negative for - chills, fever or weight loss Hematological and Lymphatic ROS: negative for - bleeding problems, blood clots or bruising Respiratory ROS: no cough, shortness of breath, or wheezing Cardiovascular ROS: no chest pain or dyspnea on exertion Gastrointestinal ROS: no abdominal pain, change in bowel habits, or black or bloody stools Genito-Urinary ROS: no dysuria, trouble voiding, or hematuria  EXAM: Filed Vitals:   09/09/12 1335  BP: 102/58  Pulse: 60  Resp: 16    General appearance: alert and cooperative Resp: clear to auscultation bilaterally Cardio: regular rate and rhythm GI: soft, non-tender; bowel sounds normal; no masses,  no organomegaly  Anal Findings: L lateral external hemorrhoid, edematous, non-thrombosed, non-bleeding    ASSESSMENT AND PLAN:  [name] is a 57 y.o. M with external hemorrhoid disease.  He is s/p 2 column  hemorrhoidectomy and banding with Dr Lindie Spruce 2 years ago.  It appears that he has a symptomatic external hemorrhoid.  I have given him instructions on how to take care of this at home, including fiber supplements daily and Sitz baths.  I will see him back in 2 months to check on his progress.     Vanita Panda, MD Colon and Rectal Surgery / General Surgery [facility]      Visit Diagnoses: 1. External hemorrhoids     Primary Care Physician: Kenneth Plum, MD

## 2012-09-09 NOTE — Patient Instructions (Addendum)
HEMORRHOIDS    Did you know... Hemorrhoids are one of the most common ailments known.  More than half the population will develop hemorrhoids, usually after age 59.  Millions of Americans currently suffer from hemorrhoids.  The average person suffers in silence for a long period before seeking medical care.  Today's treatment methods make some types of hemorrhoid removal much less painful.  What are hemorrhoids? Often described as "varicose veins of the anus and rectum", hemorrhoids are enlarged, bulging blood vessels in and about the anus and lower rectum. There are two types of hemorrhoids: external and internal, which refer to their location.  External (outside) hemorrhoids develop near the anus and are covered by very sensitive skin. These are usually painless. However, if a blood clot (thrombosis) develops in an external hemorrhoid, it becomes a painful, hard lump. The external hemorrhoid may bleed if it ruptures. Internal (inside) hemorrhoids develop within the anus beneath the lining. Painless bleeding and protrusion during bowel movements are the most common symptom. However, an internal hemorrhoid can cause severe pain if it is completely "prolapsed" - protrudes from the anal opening and cannot be pushed back inside.   What causes hemorrhoids? An exact cause is unknown; however, the upright posture of humans alone forces a great deal of pressure on the rectal veins, which sometimes causes them to bulge. Other contributing factors include:  . Aging  . Chronic constipation or diarrhea  . Pregnancy  . Heredity  . Straining during bowel movements  . Faulty bowel function due to overuse of laxatives or enemas . Spending long periods of time (e.g., reading) on the toilet  Whatever the cause, the tissues supporting the vessels stretch. As a result, the vessels dilate; their walls become thin and bleed. If the stretching and pressure continue, the weakened vessels protrude.  What are the  symptoms? If you notice any of the following, you could have hemorrhoids:  . Bleeding during bowel movements  . Protrusion during bowel movements . Itching in the anal area  . Pain  . Sensitive lump(s)  How are hemorrhoids treated? Mild symptoms can be relieved frequently by increasing the amount of fiber (e.g., fruits, vegetables, breads and cereals) and fluids in the diet. Eliminating excessive straining reduces the pressure on hemorrhoids and helps prevent them from protruding. A sitz bath - sitting in plain warm water for about 10 minutes - can also provide some relief . With these measures, the pain and swelling of most symptomatic hemorrhoids will decrease in two to seven days, and the firm lump should recede within four to six weeks. In cases of severe or persistent pain from a thrombosed hemorrhoid, your physician may elect to remove the hemorrhoid containing the clot with a small incision. Performed under local anesthesia as an outpatient, this procedure generally provides relief. Severe hemorrhoids may require special treatment, much of which can be performed on an outpatient basis.  . Ligation - the rubber band treatment - works effectively on internal hemorrhoids that protrude with bowel movements. A small rubber band is placed over the hemorrhoid, cutting off its blood supply. The hemorrhoid and the band fall off in a few days and the wound usually heals in a week or two. This procedure sometimes produces mild discomfort and bleeding and may need to be repeated for a full effect.  There is a more intense version of this procedure that is done in the OR as outpatient surgery called THD.  It involves identifying blood vessels leading to the   hemorrhoids and then tying them off with sutures.  This method is a little more painful than rubber band ligation but less painful than traditional hemorrhoidectomy and usually does not have to be repeated.  It is best for internal hemorrhoids that  bleed.  Rubber Band Ligation of Internal Hemorrhoids:  A.  Bulging, bleeding, internal hemorrhoid B.  Rubber band applied at the base of the hemorrhoid C.  About 7 days later, the banded hemorrhoid has fallen off leaving a small scar (arrow)  . Injection and Coagulation can also be used on bleeding hemorrhoids that do not protrude. Both methods are relatively painless and cause the hemorrhoid to shrivel up. . Hemorrhoidectomy - surgery to remove the hemorrhoids - is the most complete method for removal of internal and external hemorrhoids. It is necessary when (1) clots repeatedly form in external hemorrhoids; (2) ligation fails to treat internal hemorrhoids; (3) the protruding hemorrhoid cannot be reduced; or (4) there is persistent bleeding. A hemorrhoidectomy removes excessive tissue that causes the bleeding and protrusion. It is done under anesthesia using sutures, and may, depending upon circumstances, require hospitalization and a period of inactivity. Laser hemorrhoidectomies do not offer any advantage over standard operative techniques. They are also quite expensive, and contrary to popular belief, are no less painful.  Do hemorrhoids lead to cancer? No. There is no relationship between hemorrhoids and cancer. However, the symptoms of hemorrhoids, particularly bleeding, are similar to those of colorectal cancer and other diseases of the digestive system. Therefore, it is important that all symptoms are investigated by a physician specially trained in treating diseases of the colon and rectum and that everyone 50 years or older undergo screening tests for colorectal cancer. Do not rely on over-the-counter medications or other self-treatments. See a colorectal surgeon first so your symptoms can be properly evaluated and effective treatment prescribed.  2012 American Society of Colon & Rectal Surgeons    Fiber Chart  You should 25-30g of fiber per day and drinking 8 glasses of water to help  your bowels move regularly.  In the chart below you can look up how much fiber you are getting in an average day.  If you are not getting enough fiber, you should add a fiber supplement to your diet.  Examples of this include Metamucil, FiberCon and Citrucel.  These can be purchased at your local grocery store or pharmacy.      http://www.[employer].edu/offices/health/nutritioncoach/AtoZ/handouts/Fiber.pdf    GETTING TO GOOD BOWEL HEALTH. Irregular bowel habits such as constipation can lead to many problems over time.  Having one soft bowel movement a day is the most important way to prevent further problems.  The anorectal canal is designed to handle stretching and feces to safely manage our ability to get rid of solid waste (feces, poop, stool) out of our body.  BUT, hard constipated stools can act like ripping concrete bricks causing inflamed hemorrhoids, anal fissures, abdominal pain and bloating.     The goal: ONE SOFT BOWEL MOVEMENT A DAY!  To have soft, regular bowel movements:    Drink at least 8 tall glasses of water a day.     Take plenty of fiber.  Fiber is the undigested part of plant food that passes into the colon, acting s "natures broom" to encourage bowel motility and movement.  Fiber can absorb and hold large amounts of water. This results in a larger, bulkier stool, which is soft and easier to pass. Work gradually over several weeks up to 6 servings a   day of fiber (25g a day even more if needed) in the form of: o Vegetables -- Root (potatoes, carrots, turnips), leafy green (lettuce, salad greens, celery, spinach), or cooked high residue (cabbage, broccoli, etc) o Fruit -- Fresh (unpeeled skin & pulp), Dried (prunes, apricots, cherries, etc ),  or stewed ( applesauce)  o Whole grain breads, pasta, etc (whole wheat)  o Bran cereals    Bulking Agents -- This type of water-retaining fiber generally is easily obtained each day by one of the following:  o Psyllium bran -- The psyllium  plant is remarkable because its ground seeds can retain so much water. This product is available as Metamucil, Konsyl, Effersyllium, Per Diem Fiber, or the less expensive generic preparation in drug and health food stores. Although labeled a laxative, it really is not a laxative.  o Methylcellulose -- This is another fiber derived from wood which also retains water. It is available as Citrucel. o Polyethylene Glycol - and "artificial" fiber commonly called Miralax or Glycolax.  It is helpful for people with gassy or bloated feelings with regular fiber o Flax Seed - a less gassy fiber than psyllium   No reading or other relaxing activity while on the toilet. If bowel movements take longer than 5 minutes, you are too constipated.   AVOID CONSTIPATION.  High fiber and water intake usually takes care of this.  Sometimes a laxative is needed to stimulate more frequent bowel movements, but    Laxatives are not a good long-term solution as it can wear the colon out. o Osmotics (Milk of Magnesia, Fleets phosphosoda, Magnesium citrate, MiraLax, GoLytely) are safer than  o Stimulants (Senokot, Castor Oil, Dulcolax, Ex Lax)    o Do not take laxatives for more than 7days in a row.    IF SEVERELY CONSTIPATED, try a Bowel Retraining Program: o Do not use laxatives.  o Eat a diet high in roughage, such as bran cereals and leafy vegetables.  o Drink six (6) ounces of prune or apricot juice each morning.  o Eat two (2) large servings of stewed fruit each day.  o Take one (1) heaping tablespoon of a psyllium-based bulking agent twice a day. Use sugar-free sweetener when possible to avoid excessive calories.  o Eat a normal breakfast.  o Set aside 15 minutes after breakfast to sit on the toilet, but do not strain to have a bowel movement.  o If you do not have a bowel movement by the third day, use an enema and repeat the above steps.    

## 2012-09-12 ENCOUNTER — Ambulatory Visit (INDEPENDENT_AMBULATORY_CARE_PROVIDER_SITE_OTHER): Payer: Self-pay | Admitting: General Surgery

## 2012-09-16 ENCOUNTER — Other Ambulatory Visit: Payer: Self-pay | Admitting: Orthopedic Surgery

## 2012-09-16 DIAGNOSIS — M5126 Other intervertebral disc displacement, lumbar region: Secondary | ICD-10-CM

## 2012-09-20 ENCOUNTER — Other Ambulatory Visit: Payer: Self-pay | Admitting: Orthopedic Surgery

## 2012-09-20 ENCOUNTER — Ambulatory Visit
Admission: RE | Admit: 2012-09-20 | Discharge: 2012-09-20 | Disposition: A | Discharge status: Home / Self Care | Payer: BC Managed Care – PPO | Source: Ambulatory Visit | Attending: Orthopedic Surgery | Admitting: Orthopedic Surgery

## 2012-09-20 ENCOUNTER — Other Ambulatory Visit: Payer: BC Managed Care – PPO

## 2012-09-20 DIAGNOSIS — M5126 Other intervertebral disc displacement, lumbar region: Secondary | ICD-10-CM

## 2012-09-20 MED ORDER — IOHEXOL 180 MG/ML  SOLN
1.0000 mL | Freq: Once | INTRAMUSCULAR | Status: AC | PRN
  Administered 2012-09-20: 1 mL via EPIDURAL

## 2012-09-20 MED ORDER — METHYLPREDNISOLONE ACETATE 40 MG/ML INJ SUSP (RADIOLOG
120.0000 mg | Freq: Once | INTRAMUSCULAR | Status: AC
  Administered 2012-09-20: 120 mg via EPIDURAL

## 2012-09-23 ENCOUNTER — Other Ambulatory Visit: Payer: Self-pay | Admitting: Orthopedic Surgery

## 2012-09-23 DIAGNOSIS — M549 Dorsalgia, unspecified: Secondary | ICD-10-CM

## 2012-10-04 ENCOUNTER — Ambulatory Visit
Admission: RE | Admit: 2012-10-04 | Discharge: 2012-10-04 | Disposition: A | Discharge status: Home / Self Care | Payer: BC Managed Care – PPO | Source: Ambulatory Visit | Attending: Orthopedic Surgery | Admitting: Orthopedic Surgery

## 2012-10-04 VITALS — BP 109/69 | HR 47

## 2012-10-04 DIAGNOSIS — M549 Dorsalgia, unspecified: Secondary | ICD-10-CM

## 2012-10-04 MED ORDER — METHYLPREDNISOLONE ACETATE 40 MG/ML INJ SUSP (RADIOLOG
120.0000 mg | Freq: Once | INTRAMUSCULAR | Status: AC
  Administered 2012-10-04: 120 mg via EPIDURAL

## 2012-10-04 MED ORDER — IOHEXOL 180 MG/ML  SOLN
1.0000 mL | Freq: Once | INTRAMUSCULAR | Status: AC | PRN
  Administered 2012-10-04: 1 mL via EPIDURAL

## 2012-11-14 ENCOUNTER — Encounter (INDEPENDENT_AMBULATORY_CARE_PROVIDER_SITE_OTHER): Payer: BC Managed Care – PPO | Admitting: General Surgery

## 2013-01-30 IMAGING — CR DG CHEST 2V
2 series · 2 of 2 positions shown · non-contrast
Comparison: 06/10/2010

CLINICAL DATA: Left-sided chest pain.  Worse with deep breath.
Hypertension.

CHEST - 2 VIEW

[w chest pa]
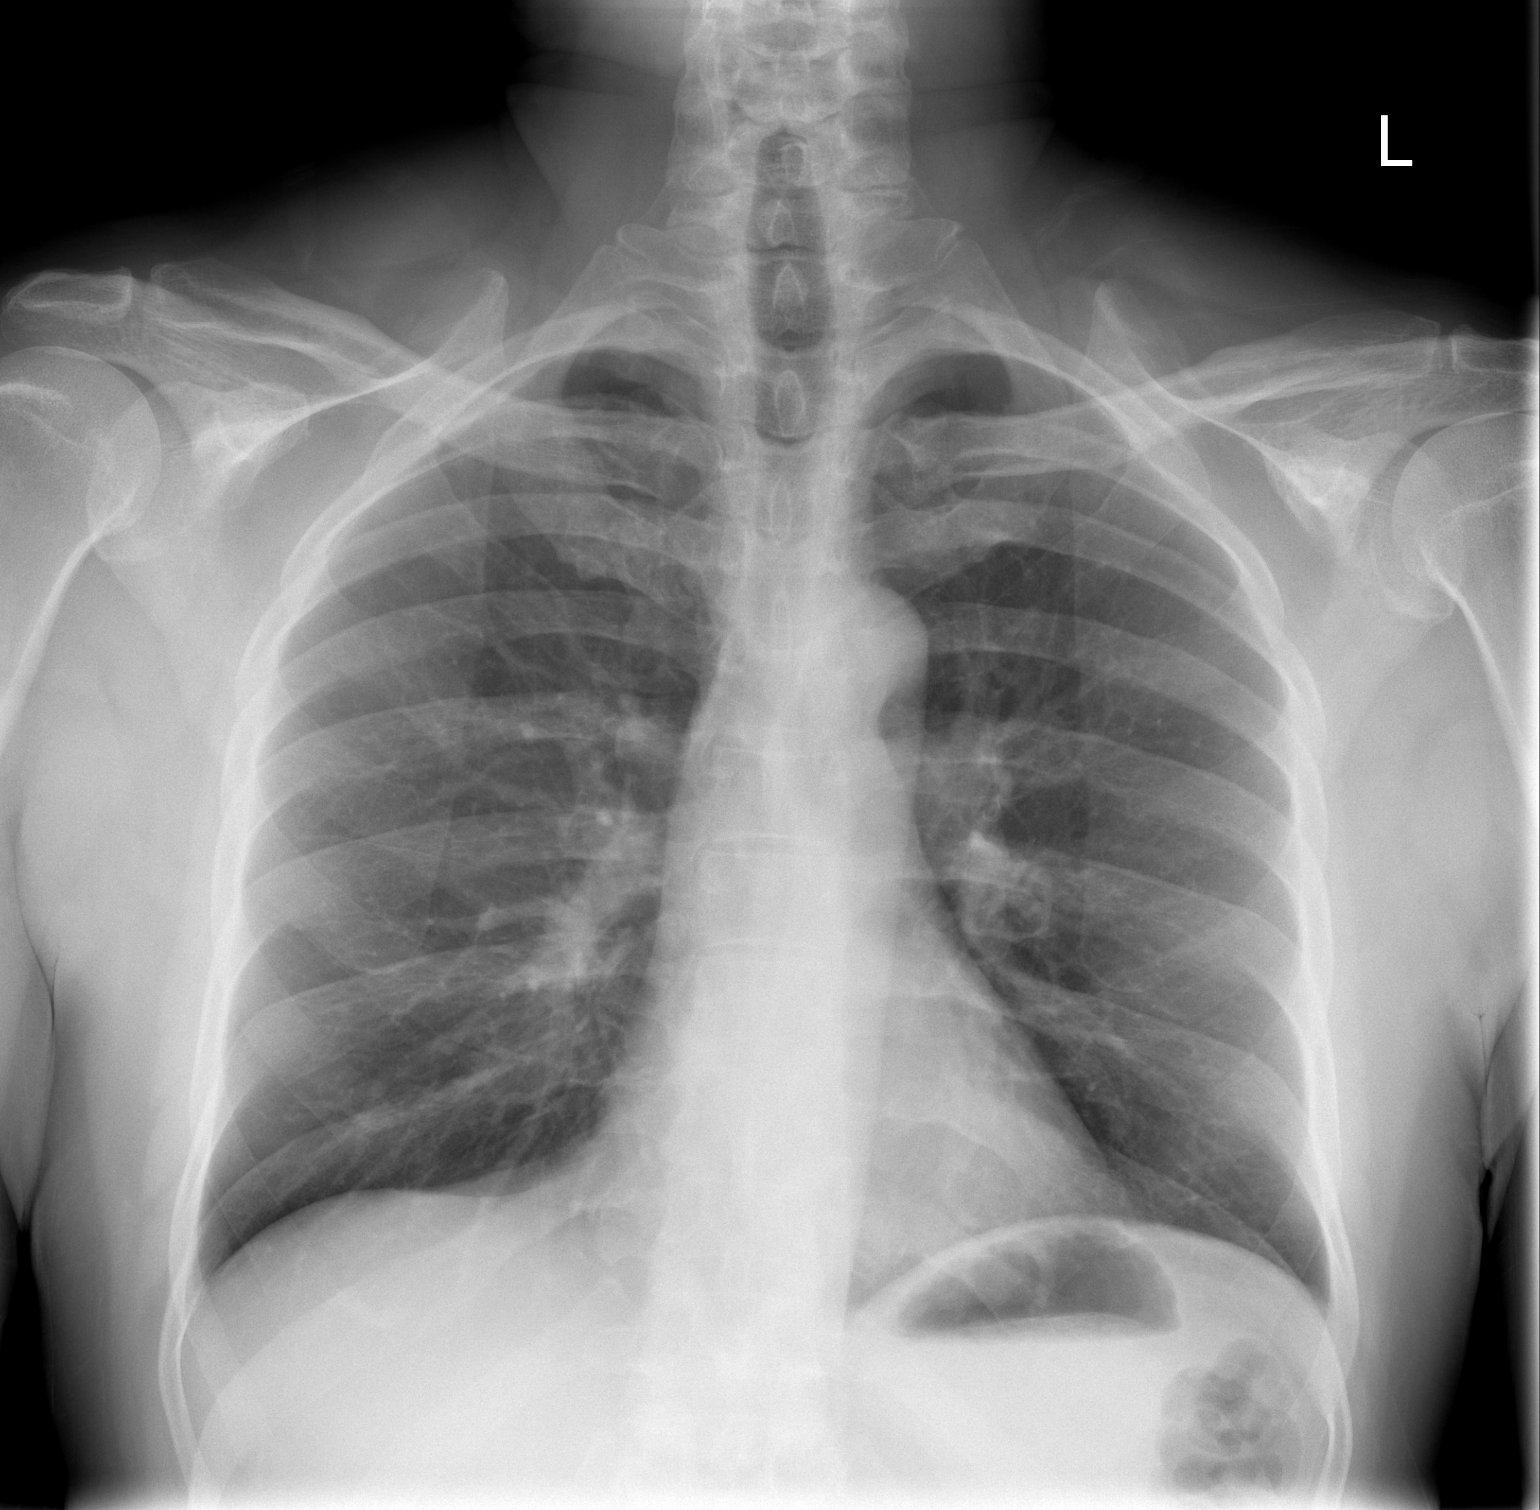

[w chest lat]
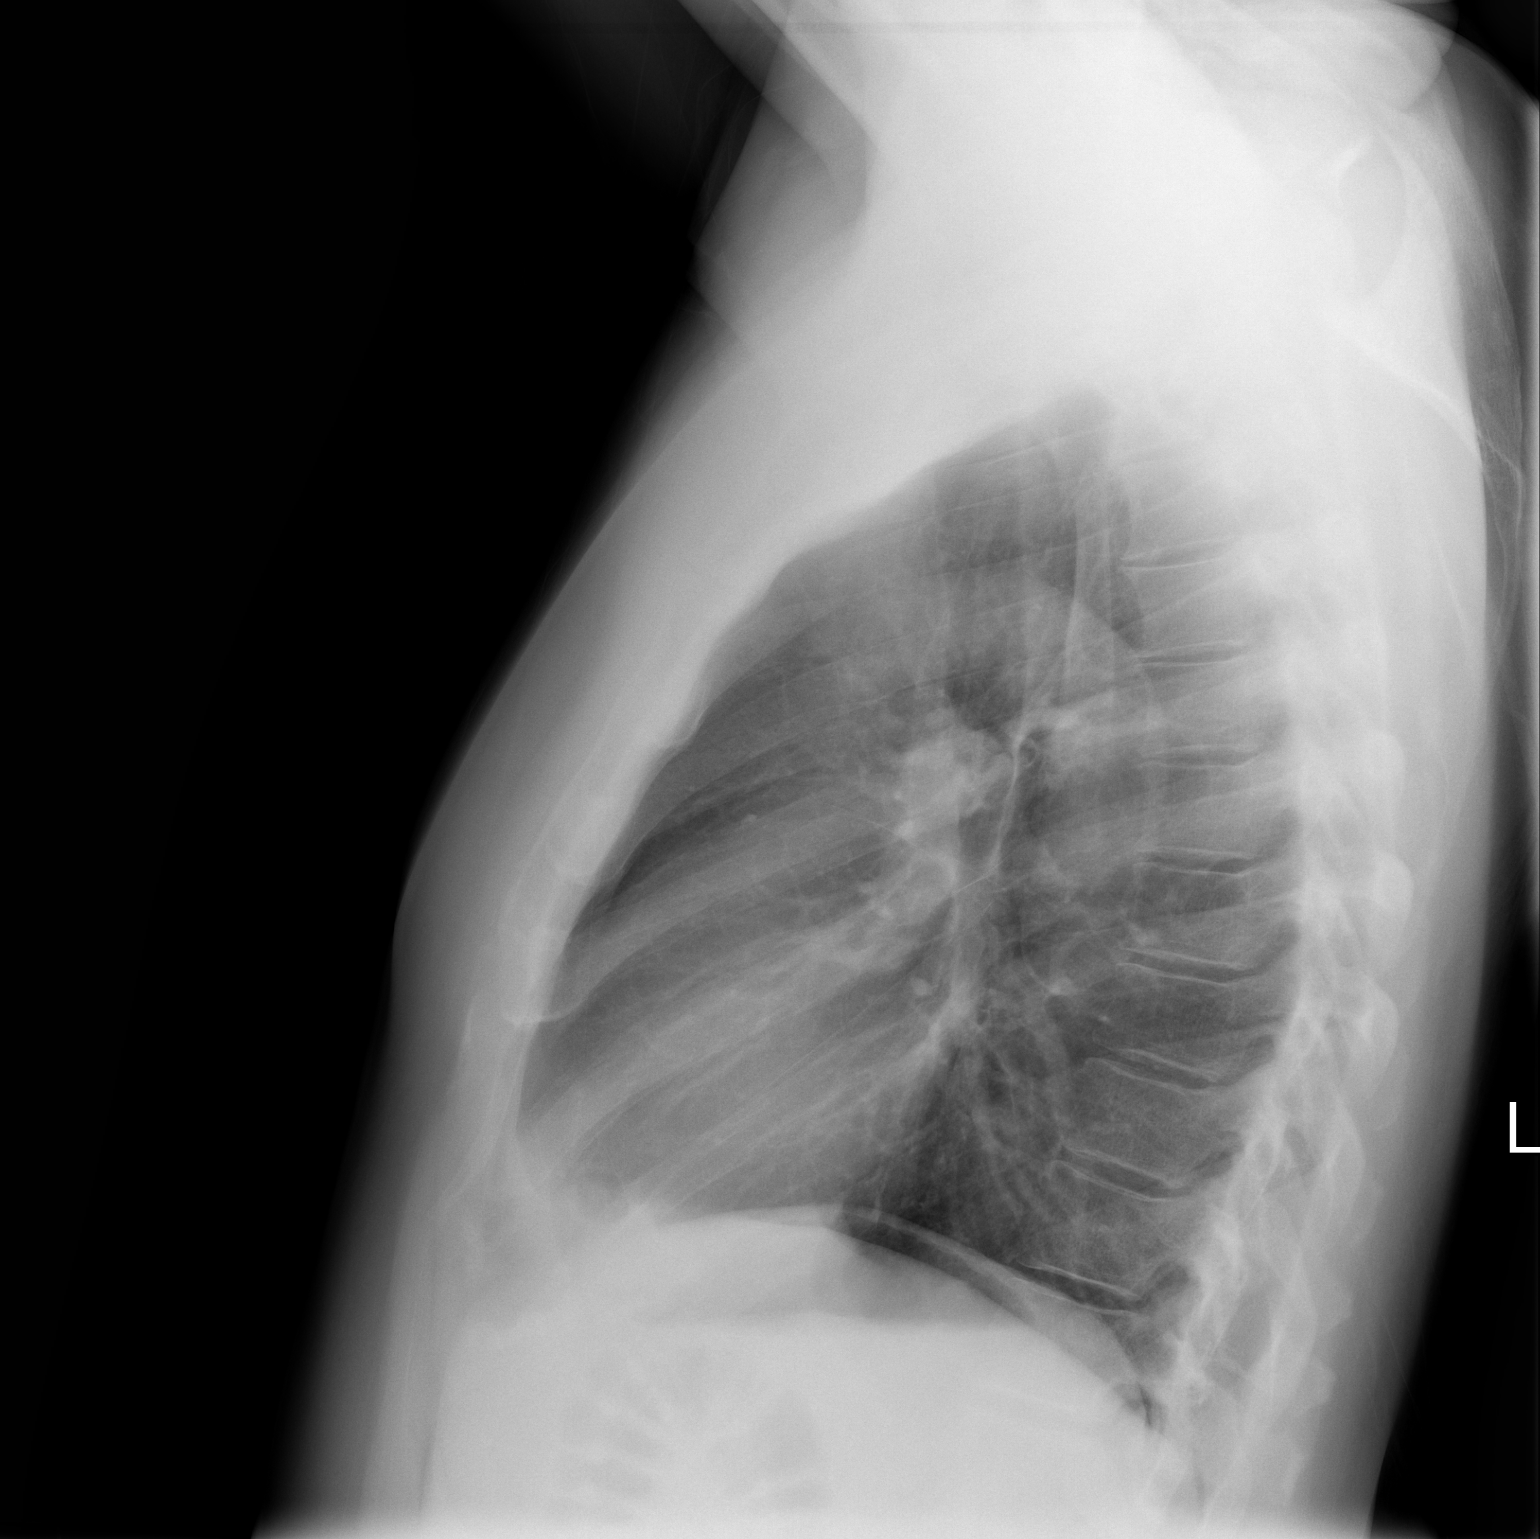

[2 of 2 positions shown; findings below may reference images not displayed]

FINDINGS: Midline trachea.  Normal heart size and mediastinal
contours. No pleural effusion or pneumothorax.  Clear lungs.
IMPRESSION: No acute cardiopulmonary disease.

## 2013-08-01 ENCOUNTER — Emergency Department (HOSPITAL_BASED_OUTPATIENT_CLINIC_OR_DEPARTMENT_OTHER)
Admission: EM | Admit: 2013-08-01 | Discharge: 2013-08-01 | Disposition: A | Discharge status: Home / Self Care | Payer: No Typology Code available for payment source | Attending: Emergency Medicine | Admitting: Emergency Medicine

## 2013-08-01 ENCOUNTER — Encounter (HOSPITAL_BASED_OUTPATIENT_CLINIC_OR_DEPARTMENT_OTHER): Payer: Self-pay | Admitting: Emergency Medicine

## 2013-08-01 DIAGNOSIS — F0781 Postconcussional syndrome: Secondary | ICD-10-CM | POA: Insufficient documentation

## 2013-08-01 DIAGNOSIS — I10 Essential (primary) hypertension: Secondary | ICD-10-CM | POA: Insufficient documentation

## 2013-08-01 DIAGNOSIS — Z79899 Other long term (current) drug therapy: Secondary | ICD-10-CM | POA: Insufficient documentation

## 2013-08-01 DIAGNOSIS — Z9889 Other specified postprocedural states: Secondary | ICD-10-CM | POA: Insufficient documentation

## 2013-08-01 DIAGNOSIS — G44309 Post-traumatic headache, unspecified, not intractable: Secondary | ICD-10-CM

## 2013-08-01 DIAGNOSIS — Z791 Long term (current) use of non-steroidal anti-inflammatories (NSAID): Secondary | ICD-10-CM | POA: Insufficient documentation

## 2013-08-01 DIAGNOSIS — G43909 Migraine, unspecified, not intractable, without status migrainosus: Secondary | ICD-10-CM | POA: Insufficient documentation

## 2013-08-01 DIAGNOSIS — Z8739 Personal history of other diseases of the musculoskeletal system and connective tissue: Secondary | ICD-10-CM | POA: Insufficient documentation

## 2013-08-01 MED ORDER — DIPHENHYDRAMINE HCL 50 MG/ML IJ SOLN
12.5000 mg | Freq: Once | INTRAMUSCULAR | Status: AC
  Administered 2013-08-01: 12.5 mg via INTRAVENOUS
  Filled 2013-08-01: qty 1

## 2013-08-01 MED ORDER — KETOROLAC TROMETHAMINE 30 MG/ML IJ SOLN
30.0000 mg | Freq: Once | INTRAMUSCULAR | Status: AC
  Administered 2013-08-01: 30 mg via INTRAVENOUS
  Filled 2013-08-01: qty 1

## 2013-08-01 MED ORDER — HYDROMORPHONE HCL PF 1 MG/ML IJ SOLN
0.5000 mg | Freq: Once | INTRAMUSCULAR | Status: AC
  Administered 2013-08-01: 0.5 mg via INTRAVENOUS
  Filled 2013-08-01: qty 1

## 2013-08-01 MED ORDER — DEXAMETHASONE SODIUM PHOSPHATE 10 MG/ML IJ SOLN
10.0000 mg | Freq: Once | INTRAMUSCULAR | Status: AC
  Administered 2013-08-01: 10 mg via INTRAVENOUS
  Filled 2013-08-01: qty 1

## 2013-08-01 MED ORDER — METOCLOPRAMIDE HCL 5 MG/ML IJ SOLN
10.0000 mg | Freq: Once | INTRAMUSCULAR | Status: AC
  Administered 2013-08-01: 10 mg via INTRAVENOUS
  Filled 2013-08-01: qty 2

## 2013-08-01 NOTE — ED Notes (Signed)
Migraine started last pm and progressively got worse. C/o nausea, photophobia.

## 2013-08-01 NOTE — ED Provider Notes (Signed)
CSN: [id number]     Arrival date & time 08/01/13  1425 History   First MD Initiated Contact with Patient 08/01/13 1429     Chief Complaint  Patient presents with  . Migraine     (Consider location/radiation/quality/duration/timing/severity/associated sxs/prior Treatment) Patient is a 58 y.o. male presenting with migraines. The history is provided by the patient. No language interpreter was used.  Migraine This is a new problem. The current episode started yesterday. Associated symptoms include headaches and nausea. Pertinent negatives include no chills, fever, vomiting or weakness. Associated symptoms comments: The patient has a history of migraine headaches with Imitrex at home for treatment which failed for current headache. He reports typical symptoms of pain, photophobia, nausea without vomiting. .    Past Medical History  Diagnosis Date  . Hypertension   . Headache(784.0)     h/o migraines, unsure of medicine but says he takes something for maintainance    . Herniated disc   . Migraine    Past Surgical History  Procedure Laterality Date  . Appendectomy    . Hernia repair      inguinal 1995& abdominal 2011  . Shoulder arthroscopy  01/02/2011    Procedure: ARTHROSCOPY SHOULDER;  Surgeon: Kennieth RadArthur F Carter;  Location: [facility] OR;  Service: Orthopedics;  Laterality: Left;   Family History  Problem Relation Age of Onset  . Anesthesia problems Neg Hx   . Hypotension Neg Hx   . Malignant hyperthermia Neg Hx   . Pseudochol deficiency Neg Hx    History  Substance Use Topics  . Smoking status: Never Smoker   . Smokeless tobacco: Not on file  . Alcohol Use: Yes     Comment: social alcohol on monthly basis     Review of Systems  Constitutional: Negative for fever and chills.  Eyes: Positive for photophobia.  Respiratory: Negative.   Cardiovascular: Negative.   Gastrointestinal: Positive for nausea. Negative for vomiting.  Musculoskeletal: Negative.   Skin: Negative.    Neurological: Positive for headaches. Negative for facial asymmetry, speech difficulty and weakness.      Allergies  Review of patient's allergies indicates no known allergies.  Home Medications   Prior to Admission medications   Medication Sig Start Date End Date Taking? Authorizing Provider  ANUSOL-HC 25 MG suppository  08/30/12   Historical Provider, MD  Cinnamon 500 MG capsule Take 500 mg by mouth 2 (two) times daily.      Historical Provider, MD  dexamethasone (DECADRON) 4 MG tablet  07/30/12   Historical Provider, MD  etodolac (LODINE XL) 400 MG 24 hr tablet Take 400 mg by mouth 2 (two) times daily.      Historical Provider, MD  metoCLOPramide (REGLAN) 10 MG tablet Take 1 tablet (10 mg total) by mouth every 6 (six) hours. 08/17/11 08/27/11  Loren Raceravid Yelverton, MD  Multiple Vitamin (MULITIVITAMIN WITH MINERALS) TABS Take 1 tablet by mouth daily.      Historical Provider, MD  omega-3 acid ethyl esters (LOVAZA) 1 G capsule Take 2 g by mouth 2 (two) times daily.      Historical Provider, MD  ondansetron (ZOFRAN-ODT) 4 MG disintegrating tablet Take 4 mg by mouth every 8 (eight) hours as needed. For nausea     Historical Provider, MD  oxyCODONE-acetaminophen (PERCOCET/ROXICET) 5-325 MG per tablet  08/30/12   Historical Provider, MD  SUMAtriptan (IMITREX) 100 MG tablet Take 100 mg by mouth every 2 (two) hours as needed. For migraine     Historical Provider, MD  topiramate (TOPAMAX) 50 MG tablet Take 50 mg by mouth daily.      Historical Provider, MD  valsartan-hydrochlorothiazide (DIOVAN-HCT) 160-25 MG per tablet Take 1 tablet by mouth daily.      Historical Provider, MD   BP 115/56  Pulse 40  Temp(Src) 99 F (37.2 C) (Oral)  Resp 18  Ht 6\' 3"  (1.905 m)  Wt 220 lb (99.791 kg)  BMI 27.50 kg/m2  SpO2 97% Physical Exam  Constitutional: He is oriented to person, place, and time. He appears well-developed and well-nourished.  HENT:  Head: Normocephalic and atraumatic.  Eyes: EOM are normal.  Pupils are equal, round, and reactive to light.  Neck: Normal range of motion.  Cardiovascular: Normal rate and regular rhythm.   No murmur heard. Pulmonary/Chest: Effort normal and breath sounds normal. He has no wheezes. He has no rales.  Abdominal: Soft. There is no tenderness.  Neurological: He is alert and oriented to person, place, and time. He has normal strength and normal reflexes. No sensory deficit. He displays a negative Romberg sign.  CN's 3-12 grossly intact. Normal coordination, no speech deficits or abnormalities, no ataxia.     ED Course  Procedures (including critical care time) Labs Review Labs Reviewed - No data to display  Imaging Review No results found.   EKG Interpretation None      MDM   Final diagnoses:  None    1. Migraine headache  Patient given headache cocktail and reports improvement in headache. VSS. No neurologic deficits of concern. Stable for discharge home.     Arnoldo HookerShari A Kendrah Lovern, PA-C 08/05/13 [phone]

## 2013-08-01 NOTE — Discharge Instructions (Signed)
Post-Concussion Syndrome Post-concussion syndrome means you have problems after a head injury. The problems can last for weeks or months. The problems usually go away on their own over time. HOME CARE   Only take medicines as told by your doctor. Do not take aspirin.  Sleep with your head raised (elevated) to help with headaches.  Avoid activities that can cause another head injury.  Do not play contact sports like football, hockey, soccer or basketball. Do not do other risky activities like downhill skiing or martial arts, or ride horses until your doctor says it is OK.  Keep all doctor visits as told. GET HELP RIGHT AWAY IF:  You feel confused or very sleepy.  You cannot wake the injured person.  You feel sick to your stomach (nauseous) or keep throwing up (vomiting).  You feel like you are moving when you are not (vertigo).  You notice the injured person's eyes moving back and forth very fast.  You start shaking (convulsing) or pass out (faint).  You have very bad headaches that do not get better with medicine.  You cannot use your arms or legs normally.  The black centers of your eyes (pupils) change size.  You have clear or bloody fluid coming from your nose or ears.  Your problems get worse, not better. MAKE SURE YOU:  Understand these instructions.  Will watch your condition.  Will get help right away if you are not doing well or get worse. Document Released: 03/02/2004 Document Revised: 11/13/2012 Document Reviewed: 08/11/2010 [facility] Patient Information 2015 [employer] [place]. This information is not intended to replace advice given to you by your health care provider. Make sure you discuss any questions you have with your health care provider.

## 2013-08-05 NOTE — ED Provider Notes (Signed)
Medical screening examination/treatment/procedure(s) were performed by non-physician practitioner and as supervising physician I was immediately available for consultation/collaboration.   EKG Interpretation None        Gilda Creasehristopher J. Pollina, MD 08/05/13 [phone]

## 2013-08-07 ENCOUNTER — Emergency Department (HOSPITAL_BASED_OUTPATIENT_CLINIC_OR_DEPARTMENT_OTHER)
Admission: EM | Admit: 2013-08-07 | Discharge: 2013-08-07 | Disposition: A | Discharge status: Home / Self Care | Payer: BC Managed Care – PPO | Attending: Emergency Medicine | Admitting: Emergency Medicine

## 2013-08-07 ENCOUNTER — Encounter (HOSPITAL_BASED_OUTPATIENT_CLINIC_OR_DEPARTMENT_OTHER): Payer: Self-pay | Admitting: Emergency Medicine

## 2013-08-07 DIAGNOSIS — R51 Headache: Secondary | ICD-10-CM | POA: Diagnosis present

## 2013-08-07 DIAGNOSIS — G43019 Migraine without aura, intractable, without status migrainosus: Secondary | ICD-10-CM

## 2013-08-07 DIAGNOSIS — I10 Essential (primary) hypertension: Secondary | ICD-10-CM | POA: Insufficient documentation

## 2013-08-07 DIAGNOSIS — G43909 Migraine, unspecified, not intractable, without status migrainosus: Secondary | ICD-10-CM | POA: Diagnosis not present

## 2013-08-07 DIAGNOSIS — Z79899 Other long term (current) drug therapy: Secondary | ICD-10-CM | POA: Diagnosis not present

## 2013-08-07 DIAGNOSIS — G8921 Chronic pain due to trauma: Secondary | ICD-10-CM | POA: Diagnosis not present

## 2013-08-07 MED ORDER — SODIUM CHLORIDE 0.9 % IV BOLUS (SEPSIS)
1000.0000 mL | Freq: Once | INTRAVENOUS | Status: AC
  Administered 2013-08-07: 1000 mL via INTRAVENOUS

## 2013-08-07 MED ORDER — DIPHENHYDRAMINE HCL 50 MG/ML IJ SOLN
25.0000 mg | Freq: Once | INTRAMUSCULAR | Status: AC
  Administered 2013-08-07: 25 mg via INTRAVENOUS
  Filled 2013-08-07: qty 1

## 2013-08-07 MED ORDER — METOCLOPRAMIDE HCL 5 MG/ML IJ SOLN
10.0000 mg | Freq: Once | INTRAMUSCULAR | Status: AC
  Administered 2013-08-07: 10 mg via INTRAVENOUS
  Filled 2013-08-07: qty 2

## 2013-08-07 MED ORDER — KETOROLAC TROMETHAMINE 30 MG/ML IJ SOLN
30.0000 mg | Freq: Once | INTRAMUSCULAR | Status: AC
  Administered 2013-08-07: 30 mg via INTRAVENOUS
  Filled 2013-08-07: qty 1

## 2013-08-07 MED ORDER — SUMATRIPTAN SUCCINATE 6 MG/0.5ML ~~LOC~~ SOLN
6.0000 mg | Freq: Once | SUBCUTANEOUS | Status: AC
  Administered 2013-08-07: 6 mg via SUBCUTANEOUS
  Filled 2013-08-07: qty 0.5
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
## 2013-08-07 NOTE — Discharge Instructions (Signed)
As discussed you are schedule for outpt mri for tomorrow per your md request. if symptoms worsen tonight then you should go to [facility]. Migraine Headache A migraine headache is very bad, throbbing pain on one or both sides of your head. Talk to your doctor about what things may bring on (trigger) your migraine headaches. HOME CARE  Only take medicines as told by your doctor.  Lie down in a dark, quiet room when you have a migraine.  Keep a journal to find out if certain things bring on migraine headaches. For example, write down:  What you eat and drink.  How much sleep you get.  Any change to your diet or medicines.  Lessen how much alcohol you drink.  Quit smoking if you smoke.  Get enough sleep.  Lessen any stress in your life.  Keep lights dim if bright lights bother you or make your migraines worse. GET HELP RIGHT AWAY IF:   Your migraine becomes really bad.  You have a fever.  You have a stiff neck.  You have trouble seeing.  Your muscles are weak, or you lose muscle control.  You lose your balance or have trouble walking.  You feel like you will pass out (faint), or you pass out.  You have really bad symptoms that are different than your first symptoms. MAKE SURE YOU:   Understand these instructions.  Will watch your condition.  Will get help right away if you are not doing well or get worse. Document Released: 11/02/2007 Document Revised: 04/17/2011 Document Reviewed: 09/30/2012 [facility] Patient Information 2015 [place], [place]. This information is not intended to replace advice given to you by your health care provider. Make sure you discuss any questions you have with your health care provider.

## 2013-08-07 NOTE — ED Provider Notes (Signed)
This chart was scribed for Glynn OctaveStephen Persia Lintner, MD by Shari HeritageAisha Amuda, ED Scribe. The patient was seen in room MH11/MH11. Patient was seen at 1:37 PM.  Medical screening examination/treatment/procedure(s) were conducted as a shared visit with non-physician practitioner(s) and myself.  I personally evaluated the patient during the encounter.   EKG Interpretation None      Patient sleepy but arousable. He wakes for exam and is oriented x3.  He endorses gradual onset headache behind right eye since this morning, similar to previous migraines. Denies any focal weakness, numbness or tingling. Several episodes of vomiting at home, no fever. No vision change.  He has had worsening headaches [place]  He reports negative CT head at that time.   Physical Exam:  CN 2-12 intact, no ataxia on finger to nose, no nystagmus, 5/5 strength throughout, no pronator drift, Romberg negative, normal gait. No meningismus. No temporal artery tenderness. PERRLA, EOMI.  Headache improved with treatment in the ED. Patient ambulatory without assistance and neurologically intact.   Case d/w Patient's PCP Dr. Julio Sickssei-Bonsu and patient's wife Dr. Neva SeatGreene.  PCP requesting MRI for persistent headaches.  Will arrange for tomorrow. No indication for emergent transfer today.    I personally performed the services described in this documentation, which was scribed in my presence. The recorded information has been reviewed and is accurate.   Glynn OctaveStephen Drystan Reader, MD 08/07/13 [phone]

## 2013-08-07 NOTE — ED Notes (Signed)
MRI scheduled for patient and information provided to Mr Neva SeatGreene and his wife by radiology. They will be contacted for specific appointment time. Patient remained in department while MD worked with radiology to schedule MRI.

## 2013-08-07 NOTE — ED Provider Notes (Signed)
Medical screening examination/treatment/procedure(s) were conducted as a shared visit with non-physician practitioner(s) and myself.  I personally evaluated the patient during the encounter.  See my additional note.   EKG Interpretation None       Glynn OctaveStephen Pacen Watford, MD 08/07/13 1735

## 2013-08-07 NOTE — ED Notes (Signed)
Pt ambulated to restroom and back to stretcher without difficulty.

## 2013-08-13 ENCOUNTER — Other Ambulatory Visit: Payer: Self-pay | Admitting: Internal Medicine

## 2013-08-13 ENCOUNTER — Ambulatory Visit (INDEPENDENT_AMBULATORY_CARE_PROVIDER_SITE_OTHER): Payer: Self-pay

## 2013-08-13 DIAGNOSIS — I6789 Other cerebrovascular disease: Secondary | ICD-10-CM

## 2013-08-13 DIAGNOSIS — R42 Dizziness and giddiness: Secondary | ICD-10-CM

## 2013-09-04 ENCOUNTER — Emergency Department (HOSPITAL_BASED_OUTPATIENT_CLINIC_OR_DEPARTMENT_OTHER)
Admission: EM | Admit: 2013-09-04 | Discharge: 2013-09-04 | Disposition: A | Discharge status: Home / Self Care | Payer: BC Managed Care – PPO | Attending: Emergency Medicine | Admitting: Emergency Medicine

## 2013-09-04 DIAGNOSIS — R51 Headache: Secondary | ICD-10-CM | POA: Diagnosis present

## 2013-09-04 DIAGNOSIS — Z79899 Other long term (current) drug therapy: Secondary | ICD-10-CM | POA: Insufficient documentation

## 2013-09-04 DIAGNOSIS — IMO0002 Reserved for concepts with insufficient information to code with codable children: Secondary | ICD-10-CM | POA: Diagnosis not present

## 2013-09-04 DIAGNOSIS — I10 Essential (primary) hypertension: Secondary | ICD-10-CM | POA: Diagnosis not present

## 2013-09-04 DIAGNOSIS — G43909 Migraine, unspecified, not intractable, without status migrainosus: Secondary | ICD-10-CM | POA: Diagnosis not present

## 2013-09-04 DIAGNOSIS — R519 Headache, unspecified: Secondary | ICD-10-CM

## 2013-09-04 MED ORDER — KETOROLAC TROMETHAMINE 30 MG/ML IJ SOLN
30.0000 mg | Freq: Once | INTRAMUSCULAR | Status: AC
  Administered 2013-09-04: 30 mg via INTRAVENOUS
  Filled 2013-09-04: qty 1

## 2013-09-04 MED ORDER — HYDROMORPHONE HCL PF 1 MG/ML IJ SOLN
1.0000 mg | Freq: Once | INTRAMUSCULAR | Status: AC
  Administered 2013-09-04: 1 mg via INTRAVENOUS
  Filled 2013-09-04: qty 1

## 2013-09-04 MED ORDER — SUMATRIPTAN SUCCINATE 100 MG PO TABS: 100.0000 mg | ORAL_TABLET | ORAL | Status: DC | PRN

## 2013-09-04 MED ORDER — DIPHENHYDRAMINE HCL 50 MG/ML IJ SOLN
25.0000 mg | Freq: Once | INTRAMUSCULAR | Status: AC
  Administered 2013-09-04: 25 mg via INTRAVENOUS
  Filled 2013-09-04: qty 1

## 2013-09-04 MED ORDER — SODIUM CHLORIDE 0.9 % IV SOLN
Freq: Once | INTRAVENOUS | Status: AC
  Administered 2013-09-04: 14:00:00 via INTRAVENOUS

## 2013-09-04 MED ORDER — METOCLOPRAMIDE HCL 5 MG/ML IJ SOLN
10.0000 mg | Freq: Once | INTRAMUSCULAR | Status: AC
  Administered 2013-09-04: 10 mg via INTRAVENOUS
  Filled 2013-09-04: qty 2

## 2013-09-04 NOTE — ED Provider Notes (Signed)
Medical screening examination/treatment/procedure(s) were conducted as a shared visit with non-physician practitioner(s) and myself.  I personally evaluated the patient during the encounter.   EKG Interpretation None     Pt seen and evaluated, he is sleeping comfortably, normal speech and no facial droop when he awakens.  Headache is improved.    Ethelda ChickMartha K Linker, MD 09/04/13 [phone]

## 2013-09-04 NOTE — ED Provider Notes (Signed)
CSN: [id number]     Arrival date & time 09/04/13  1246 History   First MD Initiated Contact with Patient 09/04/13 1331     Chief Complaint  Patient presents with  . Headache     (Consider location/radiation/quality/duration/timing/severity/associated sxs/prior Treatment) Patient is a 58 y.o. male presenting with headaches. The history is provided by the patient. No language interpreter was used.  Headache Pain location:  Generalized Quality:  Dull Radiates to:  Does not radiate Severity currently:  10/10 Severity at highest:  10/10 Onset quality:  Gradual Timing:  Constant Progression:  Worsening Chronicity:  New Similar to prior headaches: no   Context: not eating   Relieved by:  Nothing Worsened by:  Nothing tried Ineffective treatments:  None tried Associated symptoms: no fever and no vomiting   Risk factors: no anger and does not have insomnia     Past Medical History  Diagnosis Date  . Hypertension   . Headache(784.0)     h/o migraines, unsure of medicine but says he takes something for maintainance    . Herniated disc   . Migraine    Past Surgical History  Procedure Laterality Date  . Appendectomy    . Hernia repair      inguinal 1995& abdominal 2011  . Shoulder arthroscopy  01/02/2011    Procedure: ARTHROSCOPY SHOULDER;  Surgeon: [name];  Location: [facility] OR;  Service: Orthopedics;  Laterality: Left;   Family History  Problem Relation Age of Onset  . Anesthesia problems Neg Hx   . Hypotension Neg Hx   . Malignant hyperthermia Neg Hx   . Pseudochol deficiency Neg Hx    History  Substance Use Topics  . Smoking status: Never Smoker   . Smokeless tobacco: Not on file  . Alcohol Use: Yes     Comment: social alcohol on monthly basis     Review of Systems  Constitutional: Negative for fever.  Gastrointestinal: Negative for vomiting.  Neurological: Positive for headaches.  All other systems reviewed and are negative.     Allergies  Review of  patient's allergies indicates no known allergies.  Home Medications   Prior to Admission medications   Medication Sig Start Date End Date Taking? Authorizing Provider  ANUSOL-HC 25 MG suppository  08/30/12   Historical Provider, MD  Cinnamon 500 MG capsule Take 500 mg by mouth 2 (two) times daily.      Historical Provider, MD  dexamethasone (DECADRON) 4 MG tablet  07/30/12   Historical Provider, MD  etodolac (LODINE XL) 400 MG 24 hr tablet Take 400 mg by mouth 2 (two) times daily.      Historical Provider, MD  metoCLOPramide (REGLAN) 10 MG tablet Take 1 tablet (10 mg total) by mouth every 6 (six) hours. 08/17/11 08/27/11  Loren Racer, MD  Multiple Vitamin (MULITIVITAMIN WITH MINERALS) TABS Take 1 tablet by mouth daily.      Historical Provider, MD  omega-3 acid ethyl esters (LOVAZA) 1 G capsule Take 2 g by mouth 2 (two) times daily.      Historical Provider, MD  ondansetron (ZOFRAN-ODT) 4 MG disintegrating tablet Take 4 mg by mouth every 8 (eight) hours as needed. For nausea     Historical Provider, MD  oxyCODONE-acetaminophen (PERCOCET/ROXICET) 5-325 MG per tablet  08/30/12   Historical Provider, MD  SUMAtriptan (IMITREX) 100 MG tablet Take 100 mg by mouth every 2 (two) hours as needed. For migraine     Historical Provider, MD  topiramate (TOPAMAX) 50 MG tablet  Take 50 mg by mouth daily.      Historical Provider, MD  valsartan-hydrochlorothiazide (DIOVAN-HCT) 160-25 MG per tablet Take 1 tablet by mouth daily.      Historical Provider, MD   BP 155/75  Pulse 53  Temp(Src) 98.2 F (36.8 C) (Oral)  Resp 22  SpO2 99% Physical Exam  Nursing note and vitals reviewed. Constitutional: He is oriented to person, place, and time. He appears well-developed and well-nourished.  HENT:  Head: Normocephalic and atraumatic.  Eyes: Conjunctivae and EOM are normal. Pupils are equal, round, and reactive to light.  Neck: Normal range of motion.  Cardiovascular: Normal rate, regular rhythm and normal heart  sounds.   Pulmonary/Chest: Effort normal.  Abdominal: Soft. He exhibits no distension.  Musculoskeletal: Normal range of motion.  Neurological: He is alert and oriented to person, place, and time.  Skin: Skin is warm.  Psychiatric: He has a normal mood and affect.    ED Course  Procedures (including critical care time) Labs Review Labs Reviewed - No data to display  Imaging Review No results found.   EKG Interpretation None      MDM  Dr. Karma GanjaLinker in to see and evaluate   Final diagnoses:  Headache, unspecified headache type    Pt reports pain decreased fraom 10 to a 7 with migraine cocktail.   Pt still uncomfortable.   Pt given IV dilaudid 1 mg.      Elson AreasLeslie K Latravious Levitt, PA-C 09/04/13 1459  Lonia SkinnerLeslie K LomaSofia, PA-C 09/04/13 1459
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
## 2013-09-04 NOTE — ED Notes (Signed)
Family at bedside. 

## 2013-09-04 NOTE — Discharge Instructions (Signed)

## 2013-09-22 ENCOUNTER — Encounter (HOSPITAL_BASED_OUTPATIENT_CLINIC_OR_DEPARTMENT_OTHER): Payer: Self-pay | Admitting: Emergency Medicine

## 2013-09-22 ENCOUNTER — Emergency Department (HOSPITAL_BASED_OUTPATIENT_CLINIC_OR_DEPARTMENT_OTHER)
Admission: EM | Admit: 2013-09-22 | Discharge: 2013-09-22 | Disposition: A | Discharge status: Home / Self Care | Payer: BC Managed Care – PPO | Attending: Emergency Medicine | Admitting: Emergency Medicine

## 2013-09-22 DIAGNOSIS — Z791 Long term (current) use of non-steroidal anti-inflammatories (NSAID): Secondary | ICD-10-CM | POA: Insufficient documentation

## 2013-09-22 DIAGNOSIS — G43819 Other migraine, intractable, without status migrainosus: Secondary | ICD-10-CM | POA: Insufficient documentation

## 2013-09-22 DIAGNOSIS — Z8739 Personal history of other diseases of the musculoskeletal system and connective tissue: Secondary | ICD-10-CM | POA: Insufficient documentation

## 2013-09-22 DIAGNOSIS — IMO0002 Reserved for concepts with insufficient information to code with codable children: Secondary | ICD-10-CM | POA: Diagnosis not present

## 2013-09-22 DIAGNOSIS — R51 Headache: Secondary | ICD-10-CM | POA: Insufficient documentation

## 2013-09-22 DIAGNOSIS — I10 Essential (primary) hypertension: Secondary | ICD-10-CM | POA: Insufficient documentation

## 2013-09-22 DIAGNOSIS — Z79899 Other long term (current) drug therapy: Secondary | ICD-10-CM | POA: Insufficient documentation

## 2013-09-22 MED ORDER — MAGNESIUM SULFATE 40 MG/ML IJ SOLN
2.0000 g | Freq: Once | INTRAMUSCULAR | Status: AC
  Administered 2013-09-22: 2 g via INTRAVENOUS
  Filled 2013-09-22: qty 50

## 2013-09-22 MED ORDER — PROMETHAZINE HCL 25 MG/ML IJ SOLN
25.0000 mg | Freq: Once | INTRAMUSCULAR | Status: AC
  Administered 2013-09-22: 25 mg via INTRAVENOUS
  Filled 2013-09-22: qty 1

## 2013-09-22 MED ORDER — METOCLOPRAMIDE HCL 10 MG PO TABS: 10.0000 mg | ORAL_TABLET | Freq: Four times a day (QID) | ORAL | Status: DC

## 2013-09-22 MED ORDER — ONDANSETRON HCL 4 MG/2ML IJ SOLN
4.0000 mg | Freq: Once | INTRAMUSCULAR | Status: AC
  Administered 2013-09-22: 4 mg via INTRAVENOUS

## 2013-09-22 MED ORDER — SUMATRIPTAN SUCCINATE 50 MG PO TABS: 50.0000 mg | ORAL_TABLET | ORAL | Status: DC | PRN

## 2013-09-22 MED ORDER — DIVALPROEX SODIUM 250 MG PO DR TAB
500.0000 mg | DELAYED_RELEASE_TABLET | Freq: Two times a day (BID) | ORAL | Status: DC
  Administered 2013-09-22: 500 mg via ORAL
  Filled 2013-09-22: qty 2

## 2013-09-22 MED ORDER — METOCLOPRAMIDE HCL 5 MG/ML IJ SOLN
10.0000 mg | Freq: Once | INTRAMUSCULAR | Status: AC
  Administered 2013-09-22: 10 mg via INTRAVENOUS
  Filled 2013-09-22: qty 2

## 2013-09-22 MED ORDER — KETOROLAC TROMETHAMINE 30 MG/ML IJ SOLN
30.0000 mg | Freq: Once | INTRAMUSCULAR | Status: AC
  Administered 2013-09-22: 30 mg via INTRAVENOUS
  Filled 2013-09-22: qty 1

## 2013-09-22 MED ORDER — DEXAMETHASONE SODIUM PHOSPHATE 10 MG/ML IJ SOLN
10.0000 mg | Freq: Once | INTRAMUSCULAR | Status: AC
  Administered 2013-09-22: 10 mg via INTRAVENOUS
  Filled 2013-09-22: qty 1

## 2013-09-22 MED ORDER — SUMATRIPTAN SUCCINATE 6 MG/0.5ML ~~LOC~~ SOLN
6.0000 mg | Freq: Once | SUBCUTANEOUS | Status: AC
  Administered 2013-09-22: 6 mg via SUBCUTANEOUS
  Filled 2013-09-22: qty 0.5

## 2013-09-22 MED ORDER — DIPHENHYDRAMINE HCL 50 MG/ML IJ SOLN
25.0000 mg | Freq: Once | INTRAMUSCULAR | Status: AC
  Administered 2013-09-22: 25 mg via INTRAVENOUS
  Filled 2013-09-22: qty 1

## 2013-09-22 MED ORDER — ONDANSETRON HCL 4 MG/2ML IJ SOLN
INTRAMUSCULAR | Status: AC
  Filled 2013-09-22: qty 2

## 2013-09-22 NOTE — ED Provider Notes (Signed)
CSN: [id number]     Arrival date & time 09/22/13  0402 History   First MD Initiated Contact with Patient 09/22/13 0457     Chief Complaint  Patient presents with  . Headache     (Consider location/radiation/quality/duration/timing/severity/associated sxs/prior Treatment) Patient is a 58 y.o. male presenting with headaches. The history is provided by the patient.  Headache Pain location:  R temporal Quality:  Dull Radiates to:  Does not radiate Severity currently:  9/10 Severity at highest:  9/10 Onset quality:  Gradual Timing:  Constant Progression:  Worsening Chronicity:  Recurrent Similar to prior headaches: yes   Context: bright light   Context: not caffeine   Relieved by:  Nothing Worsened by:  Nothing tried Ineffective treatments:  None tried Associated symptoms: no abdominal pain, no pain, no fever, no neck pain, no neck stiffness, no numbness, no seizures, no sinus pressure and no syncope   Risk factors: no anger   No f/c/r No rashes no tick exposure  Past Medical History  Diagnosis Date  . Hypertension   . Headache(784.0)     h/o migraines, unsure of medicine but says he takes something for maintainance    . Herniated disc   . Migraine    Past Surgical History  Procedure Laterality Date  . Appendectomy    . Hernia repair      inguinal 1995& abdominal 2011  . Shoulder arthroscopy  01/02/2011    Procedure: ARTHROSCOPY SHOULDER;  Surgeon: Kennieth RadArthur F Carter;  Location: [facility] OR;  Service: Orthopedics;  Laterality: Left;   Family History  Problem Relation Age of Onset  . Anesthesia problems Neg Hx   . Hypotension Neg Hx   . Malignant hyperthermia Neg Hx   . Pseudochol deficiency Neg Hx    History  Substance Use Topics  . Smoking status: Never Smoker   . Smokeless tobacco: Not on file  . Alcohol Use: Yes     Comment: social alcohol on monthly basis     Review of Systems  Constitutional: Negative for fever.  HENT: Negative for sinus pressure.   Eyes:  Negative for pain.  Cardiovascular: Negative for syncope.  Gastrointestinal: Negative for abdominal pain.  Musculoskeletal: Negative for neck pain and neck stiffness.  Neurological: Positive for headaches. Negative for seizures and numbness.  All other systems reviewed and are negative.     Allergies  Review of patient's allergies indicates no known allergies.  Home Medications   Prior to Admission medications   Medication Sig Start Date End Date Taking? Authorizing Provider  ANUSOL-HC 25 MG suppository  08/30/12   Historical Provider, MD  Cinnamon 500 MG capsule Take 500 mg by mouth 2 (two) times daily.      Historical Provider, MD  dexamethasone (DECADRON) 4 MG tablet  07/30/12   Historical Provider, MD  etodolac (LODINE XL) 400 MG 24 hr tablet Take 400 mg by mouth 2 (two) times daily.      Historical Provider, MD  metoCLOPramide (REGLAN) 10 MG tablet Take 1 tablet (10 mg total) by mouth every 6 (six) hours. 08/17/11 08/27/11  Loren Raceravid Yelverton, MD  Multiple Vitamin (MULITIVITAMIN WITH MINERALS) TABS Take 1 tablet by mouth daily.      Historical Provider, MD  omega-3 acid ethyl esters (LOVAZA) 1 G capsule Take 2 g by mouth 2 (two) times daily.      Historical Provider, MD  ondansetron (ZOFRAN-ODT) 4 MG disintegrating tablet Take 4 mg by mouth every 8 (eight) hours as needed. For nausea  Historical Provider, MD  oxyCODONE-acetaminophen (PERCOCET/ROXICET) 5-325 MG per tablet  08/30/12   Historical Provider, MD  SUMAtriptan (IMITREX) 100 MG tablet Take 1 tablet (100 mg total) by mouth every 2 (two) hours as needed. For migraine 09/04/13   Lonia Skinner Sofia, PA-C  topiramate (TOPAMAX) 50 MG tablet Take 50 mg by mouth daily.      Historical Provider, MD  valsartan-hydrochlorothiazide (DIOVAN-HCT) 160-25 MG per tablet Take 1 tablet by mouth daily.      Historical Provider, MD   BP 143/82  Pulse 44  Temp(Src) 98.2 F (36.8 C) (Oral)  Resp 16  Ht 6\' 4"  (1.93 m)  Wt 210 lb (95.255 kg)  BMI  25.57 kg/m2  SpO2 98% Physical Exam  Constitutional: He is oriented to person, place, and time. He appears well-developed and well-nourished. No distress.  Sleeping upon entrance to the room  HENT:  Head: Normocephalic and atraumatic.  Mouth/Throat: Oropharynx is clear and moist.  Eyes: Conjunctivae and EOM are normal. Pupils are equal, round, and reactive to light.  Neck: Normal range of motion. Neck supple.  No meningismus  Cardiovascular: Normal rate, regular rhythm and intact distal pulses.   Pulmonary/Chest: Effort normal and breath sounds normal. He has no wheezes. He has no rales.  Abdominal: Soft. Bowel sounds are normal. There is no tenderness. There is no rebound and no guarding.  Musculoskeletal: Normal range of motion. He exhibits no edema.  Neurological: He is alert and oriented to person, place, and time. He has normal reflexes. No cranial nerve deficit.  Skin: Skin is warm and dry.  Psychiatric: He has a normal mood and affect.    ED Course  Procedures (including critical care time) Labs Review Labs Reviewed - No data to display  Imaging Review No results found.   EKG Interpretation None      MDM   Final diagnoses:  None    Medications  divalproex (DEPAKOTE) DR tablet 500 mg (500 mg Oral Given 09/22/13 0523)  magnesium sulfate IVPB 2 g 50 mL (2 g Intravenous New Bag/Given 09/22/13 0535)  metoCLOPramide (REGLAN) injection 10 mg (not administered)  diphenhydrAMINE (BENADRYL) injection 25 mg (not administered)  promethazine (PHENERGAN) injection 25 mg (not administered)  ketorolac (TORADOL) 30 MG/ML injection 30 mg (30 mg Intravenous Given 09/22/13 0524)  dexamethasone (DECADRON) injection 10 mg (10 mg Intravenous Given 09/22/13 0524)  ondansetron (ZOFRAN) injection 4 mg (4 mg Intravenous Given 09/22/13 0535)   Has been out of his imitrex will prescribe same follow up with your family doctor for ongoing care   Maurita Havener K Tonna Palazzi-Rasch, MD 09/22/13 [phone]
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
## 2013-09-22 NOTE — Discharge Instructions (Signed)

## 2013-09-22 NOTE — ED Notes (Signed)
MD at bedside. 

## 2013-09-22 NOTE — ED Provider Notes (Signed)
On re-exam patient is soun asleep on stomach in the room and will not rouse to verbal stimuli.  Nurse reporting 7/10 headache but EDP is unable to arouse as is soundly sleeping have added imitrex SQ.  EDP will not give narcotics for migranous headache.    Jasmine AweApril K Sumedh Shinsato-Rasch, MD 09/22/13 [phone]

## 2013-09-22 NOTE — ED Notes (Signed)
Pt sleeping soundly upon entering room to d/c

## 2013-10-03 ENCOUNTER — Ambulatory Visit
Admission: RE | Admit: 2013-10-03 | Discharge: 2013-10-03 | Disposition: A | Discharge status: Home / Self Care | Payer: BC Managed Care – PPO | Source: Ambulatory Visit | Attending: Orthopedic Surgery | Admitting: Orthopedic Surgery

## 2013-10-03 ENCOUNTER — Other Ambulatory Visit: Payer: Self-pay | Admitting: Orthopedic Surgery

## 2013-10-03 DIAGNOSIS — IMO0001 Reserved for inherently not codable concepts without codable children: Secondary | ICD-10-CM

## 2014-10-21 ENCOUNTER — Encounter: Payer: Self-pay | Admitting: Neurology

## 2014-10-21 ENCOUNTER — Ambulatory Visit (INDEPENDENT_AMBULATORY_CARE_PROVIDER_SITE_OTHER): Payer: BLUE CROSS/BLUE SHIELD | Admitting: Neurology

## 2014-10-21 VITALS — BP 138/80 | HR 46 | Resp 16 | Ht 76.0 in | Wt 212.0 lb

## 2014-10-21 DIAGNOSIS — G43009 Migraine without aura, not intractable, without status migrainosus: Secondary | ICD-10-CM | POA: Diagnosis not present

## 2014-10-21 MED ORDER — NORTRIPTYLINE HCL 10 MG PO CAPS: 10.0000 mg | ORAL_CAPSULE | Freq: Every day | ORAL | Status: DC

## 2014-10-21 NOTE — Progress Notes (Signed)
NEUROLOGY CONSULTATION NOTE  Kenneth Melendez MRN: [id number] DOB: 1955-10-27  Referring provider: [name] Primary care provider: [name]  Reason for consult:  migraine  HISTORY OF PRESENT ILLNESS: Kenneth Melendez is a 59 year old right-handed male with migraine, hyperlipidemia, pre-diabetes, hypertension and GERD who presents for migraine.  History obtained by patient and PCP note.  Images of brain MRI and MRA reviewed.  Onset:  1980s Location:  Unilateral/retro-orbital (may be either side)  Quality:  Throbbing, pounding; stabbing in the eye Intensity:  9/10 Aura:  no Prodrome:  no Associated symptoms:  Nausea and vomiting, photophobia.  No phonophobia or visual disturbance Duration:  1 to 1.5 days without treatment Frequency:  10 days per month Triggers/exacerbating factors:  Not eating Relieving factors:  sumatriptan Activity:  Cannot function once every 2 months.  Able to function if takes medication in time.    Past abortive medication:  Excedrin, Tylenol, ibuprofen Past preventative medication:  A "blood pressure medication" (possible inderal) Other past therapy:  none  Current abortive medication:  sumatriptan , Fioricet with Codeine Antihypertensive medications:  Diovan Antidepressant medications:  none Anticonvulsant medications:  topiramate  (  caused tremors).  He found it effective. Vitamins/Herbal/Supplements:  MVI, cinnamon Other therapy:  none Other medication:  Cialis, Lodine XL  He had an MRI and MRA of the head on 08/13/13, which were unremarkable.  Caffeine:  1 cup coffee daily Alcohol:  occasionally Smoker:  no Diet:  Tries to keep hydrated.  Sometimes skips meals Exercise:  In the pool daily Depression/stress:  no Sleep hygiene:  good Family history of headache:  father  PAST MEDICAL HISTORY: Past Medical History  Diagnosis Date  . Hypertension   . Headache(784.0)     h/o migraines, unsure of medicine but says he  takes something for maintainance    . Herniated disc   . Migraine   . Neck pain   . Hyperlipidemia   . Allergic rhinitis   . Pre-diabetes   . GERD (gastroesophageal reflux disease)   . Erectile dysfunction   . Leukopenia   . Hemorrhoids     PAST SURGICAL HISTORY: Past Surgical History  Procedure Laterality Date  . Appendectomy    . Hernia repair      inguinal 1995& abdominal 2011  . Shoulder arthroscopy  01/02/2011    Procedure: ARTHROSCOPY SHOULDER;  Surgeon: [name];  Location: [facility] OR;  Service: Orthopedics;  Laterality: Left;    MEDICATIONS: Current Outpatient Prescriptions on File Prior to Visit  Medication Sig Dispense Refill  . butalbital-acetaminophen-caffeine (FIORICET WITH CODEINE) 50-325-40-30 MG per capsule Take 1 capsule by mouth every 4 (four) hours as needed for headache.    . Cinnamon 500 MG capsule Take 500 mg by mouth 2 (two) times daily.      Marland Kitchen etodolac (LODINE XL) 400 MG 24 hr tablet Take 400 mg by mouth 2 (two) times daily.      . fluticasone (FLONASE) 50 MCG/ACT nasal spray Place 2 sprays into both nostrils daily.    . Multiple Vitamin (MULITIVITAMIN WITH MINERALS) TABS Take 1 tablet by mouth daily.      . SUMAtriptan (IMITREX) 100 MG tablet Take 1 tablet (100 mg total) by mouth every 2 (two) hours as needed. For migraine 10 tablet 1  . tadalafil (CIALIS) 20 MG tablet Take 20 mg by mouth daily as needed for erectile dysfunction.    . topiramate (TOPAMAX) 50 MG tablet 2 tablets in the am, 1 tablet at night    .  valsartan (DIOVAN) 160 MG tablet Take 160 mg by mouth daily.    . ondansetron (ZOFRAN-ODT) 4 MG disintegrating tablet Take 4 mg by mouth every 8 (eight) hours as needed. For nausea      No current facility-administered medications on file prior to visit.    ALLERGIES: No Known Allergies  FAMILY HISTORY: Family History  Problem Relation Age of Onset  . Anesthesia problems Neg Hx   . Hypotension Neg Hx   . Malignant hyperthermia Neg Hx     . Pseudochol deficiency Neg Hx     SOCIAL HISTORY: Social History   Social History  . Marital Status: Married    Spouse Name: N/A  . Number of Children: N/A  . Years of Education: N/A   Occupational History  . Retired     Social History Main Topics  . Smoking status: Never Smoker   . Smokeless tobacco: Never Used  . Alcohol Use: 0.0 oz/week    0 Standard drinks or equivalent per week     Comment: social alcohol on monthly basis   . Drug Use: No  . Sexual Activity: Not on file   Other Topics Concern  . Not on file   Social History Narrative    REVIEW OF SYSTEMS: Constitutional: No fevers, chills, or sweats, no generalized fatigue, change in appetite Eyes: No visual changes, double vision, eye pain Ear, nose and throat: No hearing loss, ear pain, nasal congestion, sore throat Cardiovascular: No chest pain, palpitations Respiratory:  No shortness of breath at rest or with exertion, wheezes GastrointestinaI: No nausea, vomiting, diarrhea, abdominal pain, fecal incontinence Genitourinary:  No dysuria, urinary retention or frequency Musculoskeletal:  No neck pain, back pain Integumentary: No rash, pruritus, skin lesions Neurological: as above Psychiatric: No depression, insomnia, anxiety Endocrine: No palpitations, fatigue, diaphoresis, mood swings, change in appetite, change in weight, increased thirst Hematologic/Lymphatic:  No anemia, purpura, petechiae. Allergic/Immunologic: no itchy/runny eyes, nasal congestion, recent allergic reactions, rashes  PHYSICAL EXAM: Filed Vitals:   10/21/14 0752  BP: 138/80  Pulse: 46  Resp: 16   General: No acute distress.  Patient appears well-groomed.  Head:  Normocephalic/atraumatic Eyes:  fundi unremarkable, without vessel changes, exudates, hemorrhages or papilledema. Neck: supple, no paraspinal tenderness, full range of motion Back: No paraspinal tenderness Heart: regular rate and rhythm Lungs: Clear to auscultation  bilaterally. Vascular: No carotid bruits. Neurological Exam: Mental status: alert and oriented to person, place, and time, recent and remote memory intact, fund of knowledge intact, attention and concentration intact, speech fluent and not dysarthric, language intact. Cranial nerves: CN I: not tested CN II: pupils equal, round and reactive to light, visual fields intact, fundi unremarkable, without vessel changes, exudates, hemorrhages or papilledema. CN III, IV, VI:  full range of motion, no nystagmus, no ptosis CN V: facial sensation intact CN VII: upper and lower face symmetric CN VIII: hearing intact CN IX, X: gag intact, uvula midline CN XI: sternocleidomastoid and trapezius muscles intact CN XII: tongue midline Bulk & Tone: normal, no fasciculations. Motor:  5/5 throughout Sensation: temperature and vibration sensation intact. Deep Tendon Reflexes:  2+ throughout, toes downgoing.  Finger to nose testing:  Without dysmetria.  Heel to shin:  Without dysmetria.  Gait:  Normal station and stride.  Able to turn and tandem walk. Romberg negative.  IMPRESSION: Migraine without aura  PLAN: 1.  Will start nortriptyline 10mg  at bedtime.  I do not choose a beta blocker because he has a baseline low heart rate (today  in the 40s).  Since it has been effective, we will continue topamax for now and consider tapering off if headaches become better controlled 2.  Sumatriptan 100mg  for abortive therapy.  Advised to stop Fioricet with Codeine 3.  Will call in 4 weeks with update and adjust dose accordingly.  Follow up in 3 months.  Thank you for allowing me to take part in the care of this patient.  Shon Millet, DO  CC:  Tayvin Plum, MD

## 2014-10-21 NOTE — Patient Instructions (Signed)
Migraine Recommendations: 1.  Start nortriptyline  at bedtime.  Call in 4 weeks with update and we can adjust dose if needed.  Continue the Topamax for now. 2.  Take sumatriptan  at earliest onset of headache.  May repeat dose once in 2 hours if needed.  Do not exceed two tablets in 24 hours.  Stop the Fioricet. 3.  Limit use of pain relievers to no more than 2 days out of the week.  These medications include acetaminophen, ibuprofen, triptans and narcotics.  This will help reduce risk of rebound headaches. 4.  Be aware of common food triggers such as processed sweets, processed foods with nitrites (such as deli meat, hot dogs, sausages), foods with MSG, alcohol (such as wine), chocolate, certain cheeses, certain fruits (dried fruits, some citrus fruit), vinegar, diet soda. 4.  Avoid caffeine 5.  Routine exercise 6.  Proper sleep hygiene 7.  Stay adequately hydrated with water 8.  Keep a headache diary. 9.  Maintain proper stress management. 10.  Do not skip meals. 11.  Follow up in 3 months.  CALL IN 4 WEEKS WITH UPDATE

## 2014-12-16 ENCOUNTER — Ambulatory Visit (INDEPENDENT_AMBULATORY_CARE_PROVIDER_SITE_OTHER): Payer: BLUE CROSS/BLUE SHIELD | Admitting: Neurology

## 2014-12-16 ENCOUNTER — Encounter: Payer: Self-pay | Admitting: Neurology

## 2014-12-16 VITALS — BP 122/68 | HR 64 | Ht 75.0 in | Wt 208.1 lb

## 2014-12-16 DIAGNOSIS — G43009 Migraine without aura, not intractable, without status migrainosus: Secondary | ICD-10-CM | POA: Diagnosis not present

## 2014-12-16 MED ORDER — NORTRIPTYLINE HCL 10 MG PO CAPS: 10.0000 mg | ORAL_CAPSULE | Freq: Every day | ORAL | Status: DC

## 2014-12-16 NOTE — Patient Instructions (Signed)
1.  Continue topiramate 100mg  in AM and 50mg  at night 2.  Continue nortriptyline 10mg  at night 3.  Follow up in 5 months.

## 2014-12-16 NOTE — Progress Notes (Signed)
NEUROLOGY FOLLOW UP OFFICE NOTE  LENN VOLKER [id number]  HISTORY OF PRESENT ILLNESS: Kenneth Melendez is a 59 year old right-handed male with migraine, hyperlipidemia, pre-diabetes, hypertension and GERD who follows up for migraine.    UPDATE: No headaches until over the weekend, which lasted 2 to 3 days.  Sumatriptan helped dull it.  He took a Geophysicist/field seismologist and the headache aborted. Antihypertensive medications:  Diovan Antidepressant medications:  none Anticonvulsant medications:  nortriptyline , topiramate  in AM and  in PM(200mg  total caused tremors).   Vitamins/Herbal/Supplements:  MVI, cinnamon Other therapy:  none Other medication:  Cialis, Lodine XL  Caffeine:  1 cup coffee daily Alcohol:  occasionally Smoker:  no Diet:  Tries to keep hydrated.  Sometimes skips meals Exercise:  In the pool daily Depression/stress:  no Sleep hygiene:  good  HISTORY: Onset:  1980s Location:  Unilateral/retro-orbital (may be either side)  Quality:  Throbbing, pounding; stabbing in the eye Intensity:  9/10 Aura:  no Prodrome:  no Associated symptoms:  Nausea and vomiting, photophobia.  No phonophobia or visual disturbance Duration:  1 to 1.5 days without treatment Frequency:  10 days per month Triggers/exacerbating factors:  Not eating Relieving factors:  sumatriptan Activity:  Cannot function once every 2 months.  Able to function if takes medication in time.    Past abortive medication:  Excedrin, Tylenol, ibuprofen, Fioricet with Codeine Past preventative medication:  A "blood pressure medication" (possible inderal) Other past therapy:  none  He had an MRI and MRA of the head on 08/13/13, which were unremarkable.  Family history of headache:  father  PAST MEDICAL HISTORY: Past Medical History  Diagnosis Date  . Hypertension   . Headache(784.0)     h/o migraines, unsure of medicine but says he takes something for maintainance    . Herniated disc   . Migraine   .  Neck pain   . Hyperlipidemia   . Allergic rhinitis   . Pre-diabetes   . GERD (gastroesophageal reflux disease)   . Erectile dysfunction   . Leukopenia   . Hemorrhoids     MEDICATIONS: Current Outpatient Prescriptions on File Prior to Visit  Medication Sig Dispense Refill  . butalbital-acetaminophen-caffeine (FIORICET WITH CODEINE) 50-325-40-30 MG per capsule Take 1 capsule by mouth every 4 (four) hours as needed for headache.    . Cinnamon 500 MG capsule Take 500 mg by mouth 2 (two) times daily.      Marland Kitchen etodolac (LODINE XL) 400 MG 24 hr tablet Take 400 mg by mouth 2 (two) times daily.      . fluticasone (FLONASE) 50 MCG/ACT nasal spray Place 2 sprays into both nostrils daily.    . Multiple Vitamin (MULITIVITAMIN WITH MINERALS) TABS Take 1 tablet by mouth daily.      . ondansetron (ZOFRAN-ODT) 4 MG disintegrating tablet Take 4 mg by mouth every 8 (eight) hours as needed. For nausea     . SUMAtriptan (IMITREX) 100 MG tablet Take 1 tablet (100 mg total) by mouth every 2 (two) hours as needed. For migraine 10 tablet 1  . tadalafil (CIALIS) 20 MG tablet Take 20 mg by mouth daily as needed for erectile dysfunction.    . topiramate (TOPAMAX) 50 MG tablet 2 tablets in the am, 1 tablet at night    . valsartan (DIOVAN) 160 MG tablet Take 160 mg by mouth daily.     No current facility-administered medications on file prior to visit.    ALLERGIES: No Known Allergies  FAMILY HISTORY: Family History  Problem Relation Age of Onset  . Anesthesia problems Neg Hx   . Hypotension Neg Hx   . Malignant hyperthermia Neg Hx   . Pseudochol deficiency Neg Hx     SOCIAL HISTORY: Social History   Social History  . Marital Status: Married    Spouse Name: N/A  . Number of Children: N/A  . Years of Education: N/A   Occupational History  . Retired     Social History Main Topics  . Smoking status: Never Smoker   . Smokeless tobacco: Never Used  . Alcohol Use: 0.0 oz/week    0 Standard drinks or  equivalent per week     Comment: social alcohol on monthly basis   . Drug Use: No  . Sexual Activity: Not on file   Other Topics Concern  . Not on file   Social History Narrative    REVIEW OF SYSTEMS: Constitutional: No fevers, chills, or sweats, no generalized fatigue, change in appetite Eyes: No visual changes, double vision, eye pain Ear, nose and throat: No hearing loss, ear pain, nasal congestion, sore throat Cardiovascular: No chest pain, palpitations Respiratory:  No shortness of breath at rest or with exertion, wheezes GastrointestinaI: No nausea, vomiting, diarrhea, abdominal pain, fecal incontinence Genitourinary:  No dysuria, urinary retention or frequency Musculoskeletal:  No neck pain, back pain Integumentary: No rash, pruritus, skin lesions Neurological: as above Psychiatric: No depression, insomnia, anxiety Endocrine: No palpitations, fatigue, diaphoresis, mood swings, change in appetite, change in weight, increased thirst Hematologic/Lymphatic:  No anemia, purpura, petechiae. Allergic/Immunologic: no itchy/runny eyes, nasal congestion, recent allergic reactions, rashes  PHYSICAL EXAM: Filed Vitals:   12/16/14 1120  BP: 122/68  Pulse: 64   General: No acute distress.  Patient appears well-groomed.  normal body habitus. Head:  Normocephalic/atraumatic Eyes:  Fundoscopic exam unremarkable without vessel changes, exudates, hemorrhages or papilledema. Neck: supple, no paraspinal tenderness, full range of motion Heart:  Regular rate and rhythm Lungs:  Clear to auscultation bilaterally Back: No paraspinal tenderness Neurological Exam: alert and oriented to person, place, and time. Attention span and concentration intact, recent and remote memory intact, fund of knowledge intact.  Speech fluent and not dysarthric, language intact.  CN II-XII intact. Fundoscopic exam unremarkable without vessel changes, exudates, hemorrhages or papilledema.  Bulk and tone normal, muscle  strength 5/5 throughout.  Sensation to light touch, temperature and vibration intact.  Deep tendon reflexes 2+ throughout, toes downgoing.  Finger to nose and heel to shin testing intact.  Gait normal  IMPRESSION: Migraine without aura, improved  PLAN: Topiramate 100mg  in AM and 50mg  in PM; nortriptyline 10mg  at night Sumatriptan 100mg  if needed.  Limit use of Fioricet to 3 days per month and only as second line. Follow up in 5 months.  15 minutes spent face to face with patient, over 50% spent discussing management.  Shon MilletAdam Teofilo Lupinacci, DO  CC:  Jacarius PlumGeorge Osei-Bonsu, MD

## 2015-02-11 MED FILL — NORTRIPTYLINE HCL 10 MG CAP: 10 | 30 days supply | Qty: 30 | Fill #3

## 2015-02-16 MED FILL — TOPIRAMATE 50 MG TABLET: 50 | 36 days supply | Qty: 180 | Fill #1

## 2015-03-12 ENCOUNTER — Ambulatory Visit: Payer: BLUE CROSS/BLUE SHIELD | Admitting: Neurology

## 2015-03-12 MED FILL — SUMATRIPTAN SUCC 100 MG TAB: 100 | 10 days supply | Qty: 6 | Fill #0

## 2015-03-16 MED FILL — ETODOLAC 400 MG TABLET: 400 | 30 days supply | Qty: 60 | Fill #1

## 2015-03-19 MED FILL — ORPHENADRINE 100 MG TAB SA: 100 | 15 days supply | Qty: 30 | Fill #0

## 2015-03-25 MED FILL — NORTRIPTYLINE HCL 10 MG CAP: 10 | 30 days supply | Qty: 30 | Fill #0

## 2015-04-01 MED FILL — predniSONE 50 MG TABS: 50 | 5 days supply | Qty: 5 | Fill #0

## 2015-04-01 MED FILL — KETOROLAC 10 MG TABLET: 10 | 5 days supply | Qty: 20 | Fill #0

## 2015-04-20 MED FILL — TAMSULOSIN HCL 0.4 MG CAP: 0.4 | 90 days supply | Qty: 90 | Fill #1

## 2015-04-29 MED FILL — NORTRIPTYLINE HCL 10 MG CAP: 10 | 30 days supply | Qty: 30 | Fill #1

## 2015-04-30 MED FILL — VALSARTAN 160 MG TABLET: 160 | 90 days supply | Qty: 90 | Fill #1

## 2015-05-04 MED FILL — ETODOLAC 400 MG TABLET: 400 | 30 days supply | Qty: 60 | Fill #2

## 2015-05-05 MED FILL — TOPIRAMATE 50 MG TABLET: 50 | 36 days supply | Qty: 180 | Fill #0

## 2015-05-12 ENCOUNTER — Ambulatory Visit: Payer: BLUE CROSS/BLUE SHIELD | Admitting: Neurology

## 2015-05-18 MED FILL — BUTALB-CAFF-ACETAMINOPH-COD: 50-325-40-3 | 8 days supply | Qty: 30 | Fill #0

## 2015-06-02 MED FILL — NORTRIPTYLINE HCL 10 MG CAP: 10 | 30 days supply | Qty: 30 | Fill #2

## 2015-06-03 DIAGNOSIS — R413 Other amnesia: Secondary | ICD-10-CM | POA: Diagnosis not present

## 2015-06-03 DIAGNOSIS — G43009 Migraine without aura, not intractable, without status migrainosus: Secondary | ICD-10-CM | POA: Diagnosis not present

## 2015-06-15 MED FILL — ETODOLAC 400 MG TABLET: 400 | 30 days supply | Qty: 60 | Fill #0

## 2015-06-18 DIAGNOSIS — Z136 Encounter for screening for cardiovascular disorders: Secondary | ICD-10-CM | POA: Diagnosis not present

## 2015-06-18 DIAGNOSIS — Z0000 Encounter for general adult medical examination without abnormal findings: Secondary | ICD-10-CM | POA: Diagnosis not present

## 2015-06-18 DIAGNOSIS — Z01118 Encounter for examination of ears and hearing with other abnormal findings: Secondary | ICD-10-CM | POA: Diagnosis not present

## 2015-06-18 DIAGNOSIS — R7303 Prediabetes: Secondary | ICD-10-CM | POA: Diagnosis not present

## 2015-06-18 DIAGNOSIS — K219 Gastro-esophageal reflux disease without esophagitis: Secondary | ICD-10-CM | POA: Diagnosis not present

## 2015-06-18 DIAGNOSIS — Z131 Encounter for screening for diabetes mellitus: Secondary | ICD-10-CM | POA: Diagnosis not present

## 2015-06-21 MED FILL — DEXAMETHASONE 4 MG TABLET: 4 | 4 days supply | Qty: 8 | Fill #0

## 2015-07-13 MED FILL — TAMSULOSIN HCL 0.4 MG CAP: 0.4 | 90 days supply | Qty: 90 | Fill #0

## 2015-07-13 MED FILL — NORTRIPTYLINE HCL 10 MG CAP: 10 | 30 days supply | Qty: 30 | Fill #3

## 2015-08-02 MED FILL — VALSARTAN 160 MG TABLET: 160 | 90 days supply | Qty: 90 | Fill #1

## 2015-08-09 DIAGNOSIS — R351 Nocturia: Secondary | ICD-10-CM | POA: Diagnosis not present

## 2015-08-09 DIAGNOSIS — N5201 Erectile dysfunction due to arterial insufficiency: Secondary | ICD-10-CM | POA: Diagnosis not present

## 2015-08-09 DIAGNOSIS — N401 Enlarged prostate with lower urinary tract symptoms: Secondary | ICD-10-CM | POA: Diagnosis not present

## 2015-08-19 MED FILL — NORTRIPTYLINE HCL 10 MG CAP: 10 | 30 days supply | Qty: 30 | Fill #4

## 2015-08-23 ENCOUNTER — Encounter: Payer: Self-pay | Admitting: Neurology

## 2015-08-23 ENCOUNTER — Ambulatory Visit (INDEPENDENT_AMBULATORY_CARE_PROVIDER_SITE_OTHER): Payer: BLUE CROSS/BLUE SHIELD | Admitting: Neurology

## 2015-08-23 VITALS — BP 120/80 | HR 68 | Ht 75.0 in | Wt 223.6 lb

## 2015-08-23 DIAGNOSIS — G43009 Migraine without aura, not intractable, without status migrainosus: Secondary | ICD-10-CM

## 2015-08-23 MED ORDER — NORTRIPTYLINE HCL 10 MG PO CAPS: 10.0000 mg | ORAL_CAPSULE | Freq: Every day | ORAL | Status: DC

## 2015-08-23 NOTE — Patient Instructions (Signed)
1.  Take 1/2 tablet of topiramate daily for 2 weeks and then stop 2.  Continue nortriptyline 10mg  at bedtime 3.  Follow up in 6 months.

## 2015-08-23 NOTE — Progress Notes (Signed)
Chart forwarded.  

## 2015-08-23 NOTE — Progress Notes (Signed)
NEUROLOGY FOLLOW UP OFFICE NOTE  Nadeen LandauJackie M Rochel [id number]  HISTORY OF PRESENT ILLNESS: Kenneth PitmanJackie Bondarenko is a 60 year old right-handed male with migraine, hyperlipidemia, pre-diabetes, hypertension and GERD who follows up for migraine.    UPDATE: He is doing well.  He is taking nortriptyline 10mg  at bedtime.  He has slowly tapered down topiramate to 50mg  daily.  Headaches occur usually 1 to 2 days per month.  He didn't have any headache over the past month.  He takes sumatriptan and Fioricet if needed.    HISTORY: Onset:  1980s Location:  Unilateral/retro-orbital (may be either side)   Quality:  Throbbing, pounding; stabbing in the eye Intensity:  9/10 Aura:  no Prodrome:  no Associated symptoms:  Nausea and vomiting, photophobia.  No phonophobia or visual disturbance Duration:  1 to 1.5 days without treatment Frequency:  10 days per month Triggers/exacerbating factors:  Not eating Relieving factors:  sumatriptan Activity:  Cannot function once every 2 months.  Able to function if takes medication in time.    Past abortive medication:  Excedrin, Tylenol, ibuprofen, Fioricet with Codeine Past preventative medication:  A "blood pressure medication" (possible inderal) Other past therapy:  none  He had an MRI and MRA of the head on 08/13/13, which were unremarkable.  Family history of headache:  father  PAST MEDICAL HISTORY: Past Medical History  Diagnosis Date  . Hypertension   . Headache(784.0)     h/o migraines, unsure of medicine but says he takes something for maintainance    . Herniated disc   . Migraine   . Neck pain   . Hyperlipidemia   . Allergic rhinitis   . Pre-diabetes   . GERD (gastroesophageal reflux disease)   . Erectile dysfunction   . Leukopenia   . Hemorrhoids     MEDICATIONS: Current Outpatient Prescriptions on File Prior to Visit  Medication Sig Dispense Refill  . butalbital-acetaminophen-caffeine (FIORICET WITH CODEINE) 50-325-40-30 MG per capsule  Take 1 capsule by mouth every 4 (four) hours as needed for headache.    . Cinnamon 500 MG capsule Take 500 mg by mouth 2 (two) times daily.      Marland Kitchen. etodolac (LODINE XL) 400 MG 24 hr tablet Take 400 mg by mouth 2 (two) times daily.      . fluticasone (FLONASE) 50 MCG/ACT nasal spray Place 2 sprays into both nostrils daily.    . Multiple Vitamin (MULITIVITAMIN WITH MINERALS) TABS Take 1 tablet by mouth daily.      . ondansetron (ZOFRAN-ODT) 4 MG disintegrating tablet Take 4 mg by mouth every 8 (eight) hours as needed. For nausea     . SUMAtriptan (IMITREX) 100 MG tablet Take 1 tablet (100 mg total) by mouth every 2 (two) hours as needed. For migraine 10 tablet 1  . tadalafil (CIALIS) 20 MG tablet Take 20 mg by mouth daily as needed for erectile dysfunction.    . valsartan (DIOVAN) 160 MG tablet Take 160 mg by mouth daily.     No current facility-administered medications on file prior to visit.    ALLERGIES: No Known Allergies  FAMILY HISTORY: Family History  Problem Relation Age of Onset  . Anesthesia problems Neg Hx   . Hypotension Neg Hx   . Malignant hyperthermia Neg Hx   . Pseudochol deficiency Neg Hx     SOCIAL HISTORY: Social History   Social History  . Marital Status: Married    Spouse Name: N/A  . Number of Children: N/A  .  Years of Education: N/A   Occupational History  . Retired     Social History Main Topics  . Smoking status: Never Smoker   . Smokeless tobacco: Never Used  . Alcohol Use: 0.0 oz/week    0 Standard drinks or equivalent per week     Comment: social alcohol on monthly basis   . Drug Use: No  . Sexual Activity: Not on file   Other Topics Concern  . Not on file   Social History Narrative    REVIEW OF SYSTEMS: Constitutional: No fevers, chills, or sweats, no generalized fatigue, change in appetite Eyes: No visual changes, double vision, eye pain Ear, nose and throat: No hearing loss, ear pain, nasal congestion, sore throat Cardiovascular: No  chest pain, palpitations Respiratory:  No shortness of breath at rest or with exertion, wheezes GastrointestinaI: No nausea, vomiting, diarrhea, abdominal pain, fecal incontinence Genitourinary:  No dysuria, urinary retention or frequency Musculoskeletal:  No neck pain, back pain Integumentary: No rash, pruritus, skin lesions Neurological: as above Psychiatric: No depression, insomnia, anxiety Endocrine: No palpitations, fatigue, diaphoresis, mood swings, change in appetite, change in weight, increased thirst Hematologic/Lymphatic:  No purpura, petechiae. Allergic/Immunologic: no itchy/runny eyes, nasal congestion, recent allergic reactions, rashes  PHYSICAL EXAM: Filed Vitals:   08/23/15 1317  BP: 120/80  Pulse: 68   General: No acute distress.  Patient appears well-groomed.  normal body habitus. Head:  Normocephalic/atraumatic Eyes:  Fundi examined but not visualized Neck: supple, no paraspinal tenderness, full range of motion Heart:  Regular rate and rhythm Lungs:  Clear to auscultation bilaterally Back: No paraspinal tenderness Neurological Exam: alert and oriented to person, place, and time. Attention span and concentration intact, recent and remote memory intact, fund of knowledge intact.  Speech fluent and not dysarthric, language intact.  CN II-XII intact. Bulk and tone normal, muscle strength 5/5 throughout.  Sensation to light touch intact.  Deep tendon reflexes 2+ throughout.  Finger to nose  testing intact.  Gait normal.  IMPRESSION: Migraine, stable  PLAN: 1.  Continue nortriptyline  at bedtime 2.  Will taper off topiramate completely and see how he does 3.  Follow up in 6 months.  15 minutes spent face to face with patient, over 50% spent discussing management.  Shon Millet, DO  CC:  Hamlin Plum, MD

## 2015-08-24 DIAGNOSIS — F902 Attention-deficit hyperactivity disorder, combined type: Secondary | ICD-10-CM | POA: Diagnosis not present

## 2015-08-24 DIAGNOSIS — Z79899 Other long term (current) drug therapy: Secondary | ICD-10-CM | POA: Diagnosis not present

## 2015-08-25 DIAGNOSIS — M25571 Pain in right ankle and joints of right foot: Secondary | ICD-10-CM | POA: Diagnosis not present

## 2015-08-25 DIAGNOSIS — I10 Essential (primary) hypertension: Secondary | ICD-10-CM | POA: Diagnosis not present

## 2015-08-25 DIAGNOSIS — R739 Hyperglycemia, unspecified: Secondary | ICD-10-CM | POA: Diagnosis not present

## 2015-08-25 DIAGNOSIS — N3281 Overactive bladder: Secondary | ICD-10-CM | POA: Diagnosis not present

## 2015-08-25 MED FILL — HYDROCOD-IBU 7.5-200 TAB: 7.5-200 | 5 days supply | Qty: 20 | Fill #0

## 2015-08-25 MED FILL — predniSONE 20 MG TABS: 20 | 7 days supply | Qty: 7 | Fill #0

## 2015-08-30 DIAGNOSIS — M722 Plantar fascial fibromatosis: Secondary | ICD-10-CM | POA: Diagnosis not present

## 2015-09-10 MED FILL — SUMATRIPTAN SUCC 100 MG TAB: 100 | 10 days supply | Qty: 6 | Fill #0

## 2015-09-10 MED FILL — BUTALB-CAFF-ACETAMINOPH-COD: 50-325-40-3 | 8 days supply | Qty: 30 | Fill #0

## 2015-09-13 DIAGNOSIS — E785 Hyperlipidemia, unspecified: Secondary | ICD-10-CM | POA: Diagnosis not present

## 2015-09-13 DIAGNOSIS — M109 Gout, unspecified: Secondary | ICD-10-CM | POA: Diagnosis not present

## 2015-09-13 DIAGNOSIS — I10 Essential (primary) hypertension: Secondary | ICD-10-CM | POA: Diagnosis not present

## 2015-09-13 MED FILL — DEXAMETHASONE 4 MG TABLET: 4 | 5 days supply | Qty: 10 | Fill #0

## 2015-09-20 DIAGNOSIS — M6701 Short Achilles tendon (acquired), right ankle: Secondary | ICD-10-CM | POA: Diagnosis not present

## 2015-09-20 DIAGNOSIS — M7542 Impingement syndrome of left shoulder: Secondary | ICD-10-CM | POA: Diagnosis not present

## 2015-09-20 DIAGNOSIS — M722 Plantar fascial fibromatosis: Secondary | ICD-10-CM | POA: Diagnosis not present

## 2015-09-27 MED FILL — NORTRIPTYLINE HCL 10 MG CAP: 10 | 90 days supply | Qty: 90 | Fill #0

## 2015-10-07 MED FILL — TAMSULOSIN HCL 0.4 MG CAP: 0.4 | 90 days supply | Qty: 90 | Fill #1

## 2015-10-21 DIAGNOSIS — H33322 Round hole, left eye: Secondary | ICD-10-CM | POA: Diagnosis not present

## 2015-10-21 DIAGNOSIS — H35033 Hypertensive retinopathy, bilateral: Secondary | ICD-10-CM | POA: Diagnosis not present

## 2015-10-21 DIAGNOSIS — H2513 Age-related nuclear cataract, bilateral: Secondary | ICD-10-CM | POA: Diagnosis not present

## 2015-10-28 MED FILL — SUMATRIPTAN SUCC 100 MG TAB: 100 | 10 days supply | Qty: 6 | Fill #1

## 2015-11-03 MED FILL — BUTALB-CAFF-ACETAMINOPH-COD: 50-325-40-3 | 7 days supply | Qty: 30 | Fill #0

## 2015-11-05 DIAGNOSIS — H35413 Lattice degeneration of retina, bilateral: Secondary | ICD-10-CM | POA: Diagnosis not present

## 2015-11-05 DIAGNOSIS — H35462 Secondary vitreoretinal degeneration, left eye: Secondary | ICD-10-CM | POA: Diagnosis not present

## 2015-11-05 DIAGNOSIS — H33322 Round hole, left eye: Secondary | ICD-10-CM | POA: Diagnosis not present

## 2015-11-08 MED FILL — FAMCICLOVIR 500 MG TABLET: 500 | 7 days supply | Qty: 21 | Fill #0

## 2015-11-08 MED FILL — VALSARTAN 160 MG TABLET: 160 | 90 days supply | Qty: 90 | Fill #0

## 2015-11-29 MED FILL — SUMATRIPTAN SUCC 100 MG TAB: 100 | 10 days supply | Qty: 6 | Fill #2

## 2015-12-13 DIAGNOSIS — R739 Hyperglycemia, unspecified: Secondary | ICD-10-CM | POA: Diagnosis not present

## 2015-12-13 DIAGNOSIS — I10 Essential (primary) hypertension: Secondary | ICD-10-CM | POA: Diagnosis not present

## 2015-12-13 DIAGNOSIS — Z23 Encounter for immunization: Secondary | ICD-10-CM | POA: Diagnosis not present

## 2015-12-13 DIAGNOSIS — E785 Hyperlipidemia, unspecified: Secondary | ICD-10-CM | POA: Diagnosis not present

## 2015-12-13 DIAGNOSIS — G43909 Migraine, unspecified, not intractable, without status migrainosus: Secondary | ICD-10-CM | POA: Diagnosis not present

## 2015-12-13 DIAGNOSIS — M109 Gout, unspecified: Secondary | ICD-10-CM | POA: Diagnosis not present

## 2015-12-13 DIAGNOSIS — R7303 Prediabetes: Secondary | ICD-10-CM | POA: Diagnosis not present

## 2015-12-13 DIAGNOSIS — N3281 Overactive bladder: Secondary | ICD-10-CM | POA: Diagnosis not present

## 2015-12-14 MED FILL — NORTRIPTYLINE HCL 10 MG CAP: 10 | 90 days supply | Qty: 90 | Fill #1

## 2015-12-21 MED FILL — BUTALB-CAFF-ACETAMINOPH-COD: 50-325-40-3 | 22 days supply | Qty: 90 | Fill #0

## 2015-12-21 MED FILL — SUMATRIPTAN SUCC 100 MG TAB: 100 | 10 days supply | Qty: 6 | Fill #3

## 2015-12-31 MED FILL — TAMSULOSIN HCL 0.4 MG CAP: 0.4 | 90 days supply | Qty: 90 | Fill #0

## 2016-01-13 ENCOUNTER — Other Ambulatory Visit: Payer: Self-pay | Admitting: Urology

## 2016-01-13 DIAGNOSIS — N401 Enlarged prostate with lower urinary tract symptoms: Secondary | ICD-10-CM | POA: Diagnosis not present

## 2016-01-13 DIAGNOSIS — R3912 Poor urinary stream: Secondary | ICD-10-CM | POA: Diagnosis not present

## 2016-01-14 ENCOUNTER — Encounter (HOSPITAL_BASED_OUTPATIENT_CLINIC_OR_DEPARTMENT_OTHER): Payer: Self-pay | Admitting: *Deleted

## 2016-01-14 NOTE — Progress Notes (Signed)
NPO AFTER MN W/ EXCEPTION CLEAR LIQUIDS UNTIL 0830 (NO CREAM/ MILK PRODUCTS).  ARRIVE AT 1245.  NEEDS ISTAT AND EKG.  WILL TAKE DIOVAN AND FLOMAX AM DOS W/ SIPS OF WATER.

## 2016-01-17 ENCOUNTER — Ambulatory Visit (HOSPITAL_BASED_OUTPATIENT_CLINIC_OR_DEPARTMENT_OTHER): Payer: BLUE CROSS/BLUE SHIELD | Admitting: Anesthesiology

## 2016-01-17 ENCOUNTER — Ambulatory Visit (HOSPITAL_BASED_OUTPATIENT_CLINIC_OR_DEPARTMENT_OTHER)
Admission: RE | Admit: 2016-01-17 | Discharge: 2016-01-17 | Disposition: A | Discharge status: Home / Self Care | Payer: BLUE CROSS/BLUE SHIELD | Source: Ambulatory Visit | Attending: Urology | Admitting: Urology

## 2016-01-17 ENCOUNTER — Encounter (HOSPITAL_BASED_OUTPATIENT_CLINIC_OR_DEPARTMENT_OTHER): Payer: Self-pay | Admitting: Anesthesiology

## 2016-01-17 ENCOUNTER — Encounter (HOSPITAL_BASED_OUTPATIENT_CLINIC_OR_DEPARTMENT_OTHER)
Admission: RE | Disposition: A | Discharge status: Home / Self Care | Payer: Self-pay | Source: Ambulatory Visit | Attending: Urology

## 2016-01-17 DIAGNOSIS — Z79899 Other long term (current) drug therapy: Secondary | ICD-10-CM | POA: Insufficient documentation

## 2016-01-17 DIAGNOSIS — E785 Hyperlipidemia, unspecified: Secondary | ICD-10-CM | POA: Insufficient documentation

## 2016-01-17 DIAGNOSIS — R7303 Prediabetes: Secondary | ICD-10-CM | POA: Insufficient documentation

## 2016-01-17 DIAGNOSIS — I10 Essential (primary) hypertension: Secondary | ICD-10-CM | POA: Insufficient documentation

## 2016-01-17 DIAGNOSIS — N359 Urethral stricture, unspecified: Secondary | ICD-10-CM | POA: Diagnosis not present

## 2016-01-17 DIAGNOSIS — R3912 Poor urinary stream: Secondary | ICD-10-CM | POA: Diagnosis not present

## 2016-01-17 DIAGNOSIS — N529 Male erectile dysfunction, unspecified: Secondary | ICD-10-CM | POA: Insufficient documentation

## 2016-01-17 DIAGNOSIS — N401 Enlarged prostate with lower urinary tract symptoms: Secondary | ICD-10-CM | POA: Diagnosis not present

## 2016-01-17 DIAGNOSIS — N35011 Post-traumatic bulbous urethral stricture: Secondary | ICD-10-CM | POA: Diagnosis not present

## 2016-01-17 HISTORY — PX: CYSTOSCOPY WITH URETHRAL DILATATION: SHX5125

## 2016-01-17 HISTORY — DX: Unspecified urethral stricture, male, unspecified site: N35.919

## 2016-01-17 LAB — POCT I-STAT 4, (NA,K, GLUC, HGB,HCT)
Glucose, Bld: 108 mg/dL — ABNORMAL HIGH (ref 65–99)
HCT: 47 % (ref 39.0–52.0)
Hemoglobin: 16 g/dL (ref 13.0–17.0)
Potassium: 3.9 mmol/L (ref 3.5–5.1)
Sodium: 142 mmol/L (ref 135–145)

## 2016-01-17 SURGERY — CYSTOSCOPY, WITH URETHRAL DILATION
Anesthesia: General | Site: Urethra

## 2016-01-17 MED ORDER — LIDOCAINE HCL 2 % EX GEL
CUTANEOUS | Status: DC | PRN
  Administered 2016-01-17: 1 via URETHRAL

## 2016-01-17 MED ORDER — PROPOFOL 10 MG/ML IV BOLUS
INTRAVENOUS | Status: AC
  Filled 2016-01-17: qty 20

## 2016-01-17 MED ORDER — CEFAZOLIN SODIUM-DEXTROSE 2-4 GM/100ML-% IV SOLN
INTRAVENOUS | Status: AC
  Filled 2016-01-17: qty 100

## 2016-01-17 MED ORDER — MIDAZOLAM HCL 2 MG/2ML IJ SOLN
INTRAMUSCULAR | Status: AC
  Filled 2016-01-17: qty 2

## 2016-01-17 MED ORDER — MIDAZOLAM HCL 5 MG/5ML IJ SOLN
INTRAMUSCULAR | Status: DC | PRN
  Administered 2016-01-17: 2 mg via INTRAVENOUS

## 2016-01-17 MED ORDER — CEFAZOLIN IN D5W 1 GM/50ML IV SOLN
1.0000 g | INTRAVENOUS | Status: DC
  Filled 2016-01-17: qty 50

## 2016-01-17 MED ORDER — ONDANSETRON HCL 4 MG/2ML IJ SOLN
INTRAMUSCULAR | Status: DC | PRN
  Administered 2016-01-17: 4 mg via INTRAVENOUS

## 2016-01-17 MED ORDER — LIDOCAINE 2% (20 MG/ML) 5 ML SYRINGE
INTRAMUSCULAR | Status: AC
  Filled 2016-01-17: qty 10

## 2016-01-17 MED ORDER — DEXAMETHASONE SODIUM PHOSPHATE 10 MG/ML IJ SOLN
INTRAMUSCULAR | Status: DC | PRN
  Administered 2016-01-17: 10 mg via INTRAVENOUS

## 2016-01-17 MED ORDER — CEFAZOLIN SODIUM-DEXTROSE 2-4 GM/100ML-% IV SOLN
2.0000 g | INTRAVENOUS | Status: AC
  Administered 2016-01-17: 2 g via INTRAVENOUS
  Filled 2016-01-17: qty 100

## 2016-01-17 MED ORDER — LIDOCAINE 2% (20 MG/ML) 5 ML SYRINGE
INTRAMUSCULAR | Status: DC | PRN
  Administered 2016-01-17: 100 mg via INTRAVENOUS

## 2016-01-17 MED ORDER — FENTANYL CITRATE (PF) 100 MCG/2ML IJ SOLN
INTRAMUSCULAR | Status: DC | PRN
  Administered 2016-01-17: 100 ug via INTRAVENOUS

## 2016-01-17 MED ORDER — TRAMADOL HCL 50 MG PO TABS: 50.0000 mg | ORAL_TABLET | Freq: Four times a day (QID) | ORAL | 0 refills | Status: DC | PRN

## 2016-01-17 MED ORDER — FENTANYL CITRATE (PF) 100 MCG/2ML IJ SOLN
INTRAMUSCULAR | Status: AC
  Filled 2016-01-17: qty 2

## 2016-01-17 MED ORDER — PROPOFOL 500 MG/50ML IV EMUL
INTRAVENOUS | Status: AC
  Filled 2016-01-17: qty 50

## 2016-01-17 MED ORDER — LIDOCAINE 2% (20 MG/ML) 5 ML SYRINGE
INTRAMUSCULAR | Status: AC
  Filled 2016-01-17: qty 5

## 2016-01-17 MED ORDER — ONDANSETRON HCL 4 MG/2ML IJ SOLN
INTRAMUSCULAR | Status: AC
  Filled 2016-01-17: qty 2

## 2016-01-17 MED ORDER — PHENYLEPHRINE 40 MCG/ML (10ML) SYRINGE FOR IV PUSH (FOR BLOOD PRESSURE SUPPORT)
PREFILLED_SYRINGE | INTRAVENOUS | Status: AC
  Filled 2016-01-17: qty 10

## 2016-01-17 MED ORDER — KETOROLAC TROMETHAMINE 30 MG/ML IJ SOLN
INTRAMUSCULAR | Status: AC
  Filled 2016-01-17: qty 1

## 2016-01-17 MED ORDER — LACTATED RINGERS IV SOLN
INTRAVENOUS | Status: DC
  Administered 2016-01-17: 14:00:00 via INTRAVENOUS
  Filled 2016-01-17: qty 1000

## 2016-01-17 MED ORDER — PROPOFOL 500 MG/50ML IV EMUL
INTRAVENOUS | Status: DC | PRN
  Administered 2016-01-17: 75 ug/kg/min via INTRAVENOUS

## 2016-01-17 MED ORDER — KETOROLAC TROMETHAMINE 30 MG/ML IJ SOLN
INTRAMUSCULAR | Status: DC | PRN
  Administered 2016-01-17: 30 mg via INTRAVENOUS

## 2016-01-17 MED ORDER — WATER FOR IRRIGATION, STERILE IR SOLN
Status: DC | PRN
  Administered 2016-01-17: 3000 mL via INTRAVESICAL

## 2016-01-17 MED ORDER — DEXAMETHASONE SODIUM PHOSPHATE 10 MG/ML IJ SOLN
INTRAMUSCULAR | Status: AC
  Filled 2016-01-17: qty 1

## 2016-01-17 MED FILL — traMADol HCL 50 MG TABS: 50 | 7 days supply | Qty: 30 | Fill #0

## 2016-01-17 SURGICAL SUPPLY — 16 items
BAG DRAIN URO-CYSTO SKYTR STRL (DRAIN) ×1 IMPLANT
BAG DRN UROCATH (DRAIN) ×1
CLOTH BEACON ORANGE TIMEOUT ST (SAFETY) ×4 IMPLANT
GLOVE BIO SURGEON STRL SZ7 (GLOVE) ×1 IMPLANT
GLOVE BIO SURGEON STRL SZ8 (GLOVE) ×2 IMPLANT
GLOVE INDICATOR 7.0 STRL GRN (GLOVE) ×1 IMPLANT
GOWN STRL REUS W/ TWL XL LVL3 (GOWN DISPOSABLE) ×1 IMPLANT
GOWN STRL REUS W/TWL XL LVL3 (GOWN DISPOSABLE) ×2
GUIDEWIRE 0.038 PTFE COATED (WIRE) IMPLANT
GUIDEWIRE ANG ZIPWIRE 038X150 (WIRE) IMPLANT
GUIDEWIRE STR DUAL SENSOR (WIRE) ×1 IMPLANT
KIT ROOM TURNOVER WOR (KITS) ×2 IMPLANT
MANIFOLD NEPTUNE II (INSTRUMENTS) ×1 IMPLANT
PACK CYSTO (CUSTOM PROCEDURE TRAY) ×2 IMPLANT
TUBE CONNECTING 12X1/4 (SUCTIONS) ×1 IMPLANT
WATER STERILE IRR 3000ML UROMA (IV SOLUTION) ×2 IMPLANT

## 2016-01-17 NOTE — Transfer of Care (Signed)
Immediate Anesthesia Transfer of Care Note  Patient: Kenneth LandauJackie M Golab  Procedure(s) Performed: Procedure(s): CYSTOSCOPY WITH URETHRAL  DILATATION (N/A)  Patient Location: PACU and Short Stay  Anesthesia Type:MAC  Level of Consciousness: awake, alert  and oriented  Airway & Oxygen Therapy: Patient Spontanous Breathing  Post-op Assessment: Report given to RN  Post vital signs: Reviewed and stable  Last Vitals: 116/69, 54, 16, 97%, 97.8 Vitals:   01/17/16 1330  BP: 134/71  Pulse: (!) 55  Resp: 16  Temp: 36.9 C    Last Pain:  Vitals:   01/17/16 1330  TempSrc: Oral      Patients Stated Pain Goal: 4 (01/17/16 1410)  Complications: No apparent anesthesia complications

## 2016-01-17 NOTE — H&P (Signed)
Urology Admission H&P  Chief Complaint: Weak stream  History of Present Illness: Kenneth Melendez is a 60yo with a hx of severe LUTS who was found to have a urethral stricture on office cystoscopy  Past Medical History:  Diagnosis Date  . Erectile dysfunction   . Hemorrhoids   . Hyperlipidemia   . Hypertension   . Migraine    followed by [facility]  . Pre-diabetes   . Urethral stricture    Past Surgical History:  Procedure Laterality Date  . APPENDECTOMY  1970's  . BUNIONECTOMY Right 2005  approx  . COLONOSCOPY  04/20/2009  . HEMORRHOIDECTOMY WITH HEMORRHOID BANDING  06/14/2010  . INGUINAL HERNIA REPAIR Left 1996  approx  . SHOULDER ARTHROSCOPY  01/02/2011   Procedure: ARTHROSCOPY SHOULDER;  Surgeon: Kennieth RadArthur F Carter;  Location: [facility] OR;  Service: Orthopedics;  Laterality: Left;  . SUPRAUMBILICAL HERNIA REPAIR W/ MESH  05/14/2009    Home Medications:  Prescriptions Prior to Admission  Medication Sig Dispense Refill Last Dose  . butalbital-acetaminophen-caffeine (FIORICET WITH CODEINE) 50-325-40-30 MG per capsule Take 1 capsule by mouth every 4 (four) hours as needed for headache.   Taking  . Cinnamon 500 MG capsule Take 500 mg by mouth 2 (two) times daily.     Taking  . fluticasone (FLONASE) 50 MCG/ACT nasal spray Place 2 sprays into both nostrils daily as needed.    Taking  . Multiple Vitamin (MULITIVITAMIN WITH MINERALS) TABS Take 1 tablet by mouth daily.     Taking  . nortriptyline (PAMELOR) 10 MG capsule Take 1 capsule (10 mg total) by mouth at bedtime. 90 capsule 1   . SUMAtriptan (IMITREX) 100 MG tablet Take 1 tablet (100 mg total) by mouth every 2 (two) hours as needed. For migraine 10 tablet 1 Taking  . tadalafil (CIALIS) 20 MG tablet Take 20 mg by mouth daily as needed for erectile dysfunction.   Taking  . tamsulosin (FLOMAX) 0.4 MG CAPS capsule Take 0.4 mg by mouth daily after breakfast.   1   . valsartan (DIOVAN) 160 MG tablet Take 160 mg by mouth every morning.     Taking   Allergies: No Known Allergies  Family History  Problem Relation Age of Onset  . Anesthesia problems Neg Hx   . Hypotension Neg Hx   . Malignant hyperthermia Neg Hx   . Pseudochol deficiency Neg Hx    Social History:  reports that he has never smoked. He has never used smokeless tobacco. He reports that he drinks alcohol. He reports that he does not use drugs.  Review of Systems  Genitourinary: Positive for frequency and urgency.  All other systems reviewed and are negative.   Physical Exam:  Vital signs in last 24 hours: Temp:  [98.4 F (36.9 C)] 98.4 F (36.9 C) (12/11 1330) Pulse Rate:  [55] 55 (12/11 1330) Resp:  [16] 16 (12/11 1330) BP: (134)/(71) 134/71 (12/11 1330) SpO2:  [98 %] 98 % (12/11 1330) Weight:  [99.8 kg (220 lb)] 99.8 kg (220 lb) (12/11 1330) Physical Exam  Constitutional: He is oriented to person, place, and time. He appears well-developed and well-nourished.  HENT:  Head: Normocephalic and atraumatic.  Eyes: EOM are normal. Pupils are equal, round, and reactive to light.  Neck: Normal range of motion. No thyromegaly present.  Cardiovascular: Normal rate and regular rhythm.   Respiratory: Effort normal. No respiratory distress.  GI: Soft. He exhibits no distension.  Musculoskeletal: Normal range of motion. He exhibits no edema.  Neurological:  He is alert and oriented to person, place, and time.  Skin: Skin is warm and dry.  Psychiatric: He has a normal mood and affect. His behavior is normal. Judgment and thought content normal.    Laboratory Data:  No results found for this or any previous visit (from the past 24 hour(s)). No results found for this or any previous visit (from the past 240 hour(s)). Creatinine: No results for input(s): CREATININE in the last 168 hours. Baseline Creatinine: unknwon  Impression/Assessment:  60yo with urethral stricture  Plan:  The risks/benefits/alternatives to urethral bilateral was explained to the  patient and he understands and wishes to proceed with surgery  Kenneth Melendez 01/17/2016, 1:47 PM

## 2016-01-17 NOTE — Anesthesia Procedure Notes (Signed)
Procedure Name: MAC Performed by: Briant SitesENENNY, Harlen Danford T Pre-anesthesia Checklist: Patient identified, Emergency Drugs available, Suction available, Patient being monitored and Timeout performed Oxygen Delivery Method: Nasal cannula

## 2016-01-17 NOTE — Anesthesia Preprocedure Evaluation (Addendum)
Anesthesia Evaluation  Patient identified by MRN, date of birth, ID band Patient awake    Reviewed: Allergy & Precautions, H&P , NPO status , Patient's Chart, lab work & pertinent test results  Airway Mallampati: I  TM Distance: >3 FB Neck ROM: Full    Dental no notable dental hx. (+) Teeth Intact, Dental Advisory Given   Pulmonary neg pulmonary ROS,    Pulmonary exam normal        Cardiovascular hypertension, negative cardio ROS   Rhythm:regular Rate:Normal     Neuro/Psych  Headaches,    GI/Hepatic negative GI ROS, Neg liver ROS,   Endo/Other  negative endocrine ROS  Renal/GU negative Renal ROS     Musculoskeletal   Abdominal   Peds  Hematology   Anesthesia Other Findings   Reproductive/Obstetrics                             Anesthesia Physical  Anesthesia Plan  ASA: II  Anesthesia Plan: MAC   Post-op Pain Management:    Induction: Intravenous  Airway Management Planned:   Additional Equipment:   Intra-op Plan:   Post-operative Plan:   Informed Consent: I have reviewed the patients History and Physical, chart, labs and discussed the procedure including the risks, benefits and alternatives for the proposed anesthesia with the patient or authorized representative who has indicated his/her understanding and acceptance.   Dental advisory given  Plan Discussed with: CRNA and Surgeon  Anesthesia Plan Comments:        Anesthesia Quick Evaluation

## 2016-01-17 NOTE — Brief Op Note (Signed)
01/17/2016  2:35 PM  PATIENT:  Kenneth Melendez  60 y.o. male  PRE-OPERATIVE DIAGNOSIS:  URETHRAL STRICTURE  POST-OPERATIVE DIAGNOSIS:  same  PROCEDURE:  Procedure(s): CYSTOSCOPY WITH URETHRAL BALLOON  DILATATION (N/A)  SURGEON:  Surgeon(s) and Role:    * Malen GauzePatrick L Linda Biehn, MD - Primary  PHYSICIAN ASSISTANT:   ASSISTANTS: none   ANESTHESIA:   MAC  EBL:  Total I/O In: 100 [I.V.:100] Out: -   BLOOD ADMINISTERED:none  DRAINS: Urinary Catheter (Foley)   LOCAL MEDICATIONS USED:  NONE  SPECIMEN:  No Specimen  DISPOSITION OF SPECIMEN:  N/A  COUNTS:  YES  TOURNIQUET:  * No tourniquets in log *  DICTATION: .Note written in EPIC  PLAN OF CARE: Discharge to home after PACU  PATIENT DISPOSITION:  PACU - hemodynamically stable.   Delay start of Pharmacological VTE agent (>24hrs) due to surgical blood loss or risk of bleeding: not applicable

## 2016-01-17 NOTE — Anesthesia Postprocedure Evaluation (Signed)
Anesthesia Post Note  Patient: Kenneth Melendez  Procedure(s) Performed: Procedure(s) (LRB): CYSTOSCOPY WITH URETHRAL  DILATATION (N/A)  Patient location during evaluation: PACU Anesthesia Type: MAC Level of consciousness: awake and alert Pain management: pain level controlled Vital Signs Assessment: post-procedure vital signs reviewed and stable Respiratory status: spontaneous breathing, nonlabored ventilation, respiratory function stable and patient connected to nasal cannula oxygen Cardiovascular status: stable and blood pressure returned to baseline Anesthetic complications: no    Last Vitals:  Vitals:   01/17/16 1330 01/17/16 1453  BP: 134/71 116/69  Pulse: (!) 55 (!) 48  Resp: 16   Temp: 36.9 C 36.2 C    Last Pain:  Vitals:   01/17/16 1453  TempSrc: Oral                 [name], Waldemar Siegel E

## 2016-01-18 ENCOUNTER — Encounter (HOSPITAL_BASED_OUTPATIENT_CLINIC_OR_DEPARTMENT_OTHER): Payer: Self-pay | Admitting: Urology

## 2016-01-20 DIAGNOSIS — N35011 Post-traumatic bulbous urethral stricture: Secondary | ICD-10-CM | POA: Diagnosis not present

## 2016-01-21 DIAGNOSIS — N35011 Post-traumatic bulbous urethral stricture: Secondary | ICD-10-CM | POA: Diagnosis not present

## 2016-01-21 NOTE — Op Note (Signed)
Preoperative diagnosis: bulbar urethral stricture  Postoperative diagnosis: same  Procedure: 1 cystoscopy 2. Urethral dilation to 20 French  Attending: Wilkie AyePatrick Derrin Currey  Anesthesia: General  Estimated blood loss: Minimal  Drains: 18 French foley  Specimens: none  Antibiotics: ancef  Findings: dense less than 0.5cm length bulbar urethral stricture. bilobar prostate enlargement. No masses/lesions in the bladder. Ureteral orifices in normal anatomic location.   Indications: Patient is a 60 year old male with a history of BPH and weak stream. He was found to have a urethral stricture on office cystoscopy.  After discussing treatment options, they decided proceed with urethral dilation.  Procedure her in detail: The patient was brought to the operating room and a brief timeout was done to ensure correct patient, correct procedure, correct site.  General anesthesia was administered patient was placed in dorsal lithotomy position.  Their genitalia was then prepped and draped in usual sterile fashion.  A rigid 22 French cystoscope was passed in the urethra and we encountered a dense bulbar urethral stricture. We then advanced a sensor wire through the urethra and into the bladder. We then removed the cystoscope and proceed to dilate the stricture from 8 french to 20 french with the haymen dilators. We then reinserted the cystoscope and noted an open stricture. .  Bladder was inspected and we noted no masses or lesions.  the ureteral orifices were in the normal orthotopic locations. the bladder was then drained, an 3318 French foley was placed and this concluded the procedure which was well tolerated by patient.  Complications: None  Condition: Stable, extubated, transferred to PACU  Plan: Patient is to be discharged home. He will followup in 3 days for a voiding trial

## 2016-01-25 DIAGNOSIS — N401 Enlarged prostate with lower urinary tract symptoms: Secondary | ICD-10-CM | POA: Diagnosis not present

## 2016-01-25 DIAGNOSIS — R310 Gross hematuria: Secondary | ICD-10-CM | POA: Diagnosis not present

## 2016-01-25 DIAGNOSIS — N99111 Postprocedural bulbous urethral stricture: Secondary | ICD-10-CM | POA: Diagnosis not present

## 2016-02-02 MED FILL — VALSARTAN 160 MG TABLET: 160 | 90 days supply | Qty: 90 | Fill #1

## 2016-02-23 ENCOUNTER — Ambulatory Visit: Payer: BLUE CROSS/BLUE SHIELD | Admitting: Neurology

## 2016-03-01 MED FILL — SUMATRIPTAN SUCC 100 MG TAB: 100 | 10 days supply | Qty: 6 | Fill #4

## 2016-03-03 DIAGNOSIS — M25561 Pain in right knee: Secondary | ICD-10-CM | POA: Diagnosis not present

## 2016-03-03 MED FILL — DICLOFENAC SOD 75 MG TAB EC: 75 | 15 days supply | Qty: 30 | Fill #0

## 2016-03-07 DIAGNOSIS — M7540 Impingement syndrome of unspecified shoulder: Secondary | ICD-10-CM | POA: Diagnosis not present

## 2016-03-07 DIAGNOSIS — M25512 Pain in left shoulder: Secondary | ICD-10-CM | POA: Diagnosis not present

## 2016-03-07 DIAGNOSIS — M25511 Pain in right shoulder: Secondary | ICD-10-CM | POA: Diagnosis not present

## 2016-03-13 DIAGNOSIS — Z0000 Encounter for general adult medical examination without abnormal findings: Secondary | ICD-10-CM | POA: Diagnosis not present

## 2016-03-13 DIAGNOSIS — N3281 Overactive bladder: Secondary | ICD-10-CM | POA: Diagnosis not present

## 2016-03-13 DIAGNOSIS — R7303 Prediabetes: Secondary | ICD-10-CM | POA: Diagnosis not present

## 2016-03-13 DIAGNOSIS — I10 Essential (primary) hypertension: Secondary | ICD-10-CM | POA: Diagnosis not present

## 2016-03-13 DIAGNOSIS — M109 Gout, unspecified: Secondary | ICD-10-CM | POA: Diagnosis not present

## 2016-03-13 DIAGNOSIS — G43909 Migraine, unspecified, not intractable, without status migrainosus: Secondary | ICD-10-CM | POA: Diagnosis not present

## 2016-03-13 DIAGNOSIS — K219 Gastro-esophageal reflux disease without esophagitis: Secondary | ICD-10-CM | POA: Diagnosis not present

## 2016-03-13 DIAGNOSIS — Z01118 Encounter for examination of ears and hearing with other abnormal findings: Secondary | ICD-10-CM | POA: Diagnosis not present

## 2016-03-13 DIAGNOSIS — Z131 Encounter for screening for diabetes mellitus: Secondary | ICD-10-CM | POA: Diagnosis not present

## 2016-03-13 DIAGNOSIS — Z136 Encounter for screening for cardiovascular disorders: Secondary | ICD-10-CM | POA: Diagnosis not present

## 2016-03-13 DIAGNOSIS — E785 Hyperlipidemia, unspecified: Secondary | ICD-10-CM | POA: Diagnosis not present

## 2016-03-28 MED FILL — BUTALB-CAFF-ACETAMINOPH-COD: 50-325-40-3 | 22 days supply | Qty: 90 | Fill #0

## 2016-03-31 DIAGNOSIS — R310 Gross hematuria: Secondary | ICD-10-CM | POA: Diagnosis not present

## 2016-03-31 DIAGNOSIS — Z79899 Other long term (current) drug therapy: Secondary | ICD-10-CM | POA: Diagnosis not present

## 2016-03-31 DIAGNOSIS — N401 Enlarged prostate with lower urinary tract symptoms: Secondary | ICD-10-CM | POA: Diagnosis not present

## 2016-03-31 DIAGNOSIS — Z7982 Long term (current) use of aspirin: Secondary | ICD-10-CM | POA: Diagnosis not present

## 2016-04-03 MED FILL — TAMSULOSIN HCL 0.4 MG CAP: 0.4 | 90 days supply | Qty: 90 | Fill #1

## 2016-04-17 ENCOUNTER — Other Ambulatory Visit: Payer: Self-pay | Admitting: Neurology

## 2016-04-17 MED FILL — NORTRIPTYLINE HCL 10 MG CAP: 10 | 30 days supply | Qty: 30 | Fill #0

## 2016-05-01 MED FILL — VALSARTAN 160 MG TABLET: 160 | 90 days supply | Qty: 90 | Fill #2

## 2016-05-26 MED FILL — NORTRIPTYLINE HCL 10 MG CAP: 10 | 30 days supply | Qty: 30 | Fill #1

## 2016-05-26 MED FILL — SUMATRIPTAN SUCC 100 MG TAB: 100 | 10 days supply | Qty: 6 | Fill #5

## 2016-06-29 MED FILL — SUMATRIPTAN SUCC 100 MG TAB: 100 | 30 days supply | Qty: 6 | Fill #0

## 2016-07-04 MED FILL — NORTRIPTYLINE HCL 10 MG CAP: 10 | 30 days supply | Qty: 30 | Fill #0

## 2016-07-04 MED FILL — TAMSULOSIN HCL 0.4 MG CAP: 0.4 | 90 days supply | Qty: 90 | Fill #2

## 2016-07-05 MED FILL — BUTALB-CAFF-ACETAMINOPH-COD: 50-325-40-3 | 23 days supply | Qty: 90 | Fill #0

## 2016-07-17 DIAGNOSIS — I10 Essential (primary) hypertension: Secondary | ICD-10-CM | POA: Diagnosis not present

## 2016-07-17 DIAGNOSIS — M109 Gout, unspecified: Secondary | ICD-10-CM | POA: Diagnosis not present

## 2016-07-17 DIAGNOSIS — G43909 Migraine, unspecified, not intractable, without status migrainosus: Secondary | ICD-10-CM | POA: Diagnosis not present

## 2016-07-17 DIAGNOSIS — E785 Hyperlipidemia, unspecified: Secondary | ICD-10-CM | POA: Diagnosis not present

## 2016-07-17 MED FILL — ATORVASTATIN 10 MG TABLET: 10 | 90 days supply | Qty: 90 | Fill #0

## 2016-08-04 ENCOUNTER — Other Ambulatory Visit: Payer: Self-pay | Admitting: Neurology

## 2016-08-04 MED FILL — VALSARTAN 160 MG TABLET: 160 | 90 days supply | Qty: 90 | Fill #3

## 2016-10-02 MED FILL — BUTALB-CAFF-ACETAMINOPH-COD: 50-325-40-3 | 15 days supply | Qty: 90 | Fill #0

## 2016-10-10 MED FILL — TAMSULOSIN HCL 0.4 MG CAP: 0.4 | 90 days supply | Qty: 90 | Fill #3

## 2016-10-13 DIAGNOSIS — M722 Plantar fascial fibromatosis: Secondary | ICD-10-CM | POA: Diagnosis not present

## 2016-10-13 DIAGNOSIS — M79671 Pain in right foot: Secondary | ICD-10-CM | POA: Diagnosis not present

## 2016-10-13 DIAGNOSIS — M79674 Pain in right toe(s): Secondary | ICD-10-CM | POA: Diagnosis not present

## 2016-10-16 DIAGNOSIS — N5201 Erectile dysfunction due to arterial insufficiency: Secondary | ICD-10-CM | POA: Diagnosis not present

## 2016-10-16 DIAGNOSIS — N401 Enlarged prostate with lower urinary tract symptoms: Secondary | ICD-10-CM | POA: Diagnosis not present

## 2016-10-16 DIAGNOSIS — R3912 Poor urinary stream: Secondary | ICD-10-CM | POA: Diagnosis not present

## 2016-10-16 MED FILL — ATORVASTATIN 10 MG TABLET: 10 | 90 days supply | Qty: 90 | Fill #1

## 2016-10-27 DIAGNOSIS — M79671 Pain in right foot: Secondary | ICD-10-CM | POA: Diagnosis not present

## 2016-10-30 DIAGNOSIS — E785 Hyperlipidemia, unspecified: Secondary | ICD-10-CM | POA: Diagnosis not present

## 2016-10-30 DIAGNOSIS — M109 Gout, unspecified: Secondary | ICD-10-CM | POA: Diagnosis not present

## 2016-10-30 DIAGNOSIS — G43909 Migraine, unspecified, not intractable, without status migrainosus: Secondary | ICD-10-CM | POA: Diagnosis not present

## 2016-10-30 DIAGNOSIS — I10 Essential (primary) hypertension: Secondary | ICD-10-CM | POA: Diagnosis not present

## 2016-10-30 MED FILL — VALSARTAN 160 MG TABLET: 160 | 90 days supply | Qty: 90 | Fill #0

## 2016-11-01 DIAGNOSIS — H2513 Age-related nuclear cataract, bilateral: Secondary | ICD-10-CM | POA: Diagnosis not present

## 2016-11-01 DIAGNOSIS — H33322 Round hole, left eye: Secondary | ICD-10-CM | POA: Diagnosis not present

## 2016-11-01 DIAGNOSIS — H35033 Hypertensive retinopathy, bilateral: Secondary | ICD-10-CM | POA: Diagnosis not present

## 2016-11-06 DIAGNOSIS — Z23 Encounter for immunization: Secondary | ICD-10-CM | POA: Diagnosis not present

## 2016-11-17 DIAGNOSIS — N401 Enlarged prostate with lower urinary tract symptoms: Secondary | ICD-10-CM | POA: Diagnosis not present

## 2016-11-27 MED FILL — SUMATRIPTAN SUCC 100 MG TAB: 100 | 30 days supply | Qty: 6 | Fill #0

## 2016-11-28 DIAGNOSIS — N410 Acute prostatitis: Secondary | ICD-10-CM | POA: Diagnosis not present

## 2016-11-28 DIAGNOSIS — N401 Enlarged prostate with lower urinary tract symptoms: Secondary | ICD-10-CM | POA: Diagnosis not present

## 2016-11-28 DIAGNOSIS — R972 Elevated prostate specific antigen [PSA]: Secondary | ICD-10-CM | POA: Diagnosis not present

## 2016-11-29 MED FILL — CIPROFLOXACIN HCL 500 MG TA: 500 | 15 days supply | Qty: 30 | Fill #0

## 2016-12-22 DIAGNOSIS — M79671 Pain in right foot: Secondary | ICD-10-CM | POA: Diagnosis not present

## 2016-12-27 MED FILL — SUMATRIPTAN SUCC 100 MG TAB: 100 | 30 days supply | Qty: 6 | Fill #1

## 2016-12-27 MED FILL — PROMETHAZINE 25 MG TABLET: 25 | 7 days supply | Qty: 30 | Fill #0

## 2017-01-15 MED FILL — SUMATRIPTAN SUCC 100 MG TAB: 100 | 30 days supply | Qty: 6 | Fill #2

## 2017-01-15 MED FILL — VALSARTAN 160 MG TABLET: 160 | 90 days supply | Qty: 90 | Fill #0

## 2017-01-15 MED FILL — ATORVASTATIN 10 MG TABLET: 10 | 90 days supply | Qty: 90 | Fill #0

## 2017-01-15 MED FILL — TAMSULOSIN HCL 0.4 MG CAP: 0.4 | 90 days supply | Qty: 90 | Fill #0

## 2017-01-17 MED FILL — BUTALB-CAFF-ACETAMINOPH-COD: 50-325-40-3 | 15 days supply | Qty: 90 | Fill #0

## 2017-01-18 DIAGNOSIS — G43909 Migraine, unspecified, not intractable, without status migrainosus: Secondary | ICD-10-CM | POA: Diagnosis not present

## 2017-01-18 DIAGNOSIS — R740 Nonspecific elevation of levels of transaminase and lactic acid dehydrogenase [LDH]: Secondary | ICD-10-CM | POA: Diagnosis not present

## 2017-01-18 DIAGNOSIS — I10 Essential (primary) hypertension: Secondary | ICD-10-CM | POA: Diagnosis not present

## 2017-01-18 DIAGNOSIS — E785 Hyperlipidemia, unspecified: Secondary | ICD-10-CM | POA: Diagnosis not present

## 2017-01-18 DIAGNOSIS — M109 Gout, unspecified: Secondary | ICD-10-CM | POA: Diagnosis not present

## 2017-01-18 DIAGNOSIS — R972 Elevated prostate specific antigen [PSA]: Secondary | ICD-10-CM | POA: Diagnosis not present

## 2017-01-31 DIAGNOSIS — R0789 Other chest pain: Secondary | ICD-10-CM | POA: Diagnosis not present

## 2017-01-31 DIAGNOSIS — Z5181 Encounter for therapeutic drug level monitoring: Secondary | ICD-10-CM | POA: Diagnosis not present

## 2017-01-31 DIAGNOSIS — R600 Localized edema: Secondary | ICD-10-CM | POA: Diagnosis not present

## 2017-01-31 DIAGNOSIS — J9811 Atelectasis: Secondary | ICD-10-CM | POA: Diagnosis not present

## 2017-01-31 DIAGNOSIS — R079 Chest pain, unspecified: Secondary | ICD-10-CM | POA: Diagnosis not present

## 2017-01-31 DIAGNOSIS — R001 Bradycardia, unspecified: Secondary | ICD-10-CM | POA: Diagnosis not present

## 2017-02-05 DIAGNOSIS — M79671 Pain in right foot: Secondary | ICD-10-CM | POA: Diagnosis not present

## 2017-02-16 DIAGNOSIS — M79671 Pain in right foot: Secondary | ICD-10-CM | POA: Diagnosis not present

## 2017-02-23 DIAGNOSIS — M79671 Pain in right foot: Secondary | ICD-10-CM | POA: Diagnosis not present

## 2017-03-22 DIAGNOSIS — N138 Other obstructive and reflux uropathy: Secondary | ICD-10-CM | POA: Diagnosis not present

## 2017-03-22 DIAGNOSIS — T387X5A Adverse effect of androgens and anabolic congeners, initial encounter: Secondary | ICD-10-CM | POA: Diagnosis not present

## 2017-03-22 DIAGNOSIS — R339 Retention of urine, unspecified: Secondary | ICD-10-CM | POA: Diagnosis not present

## 2017-03-22 DIAGNOSIS — N401 Enlarged prostate with lower urinary tract symptoms: Secondary | ICD-10-CM | POA: Diagnosis not present

## 2017-03-22 DIAGNOSIS — E291 Testicular hypofunction: Secondary | ICD-10-CM | POA: Diagnosis not present

## 2017-03-22 DIAGNOSIS — R358 Other polyuria: Secondary | ICD-10-CM | POA: Diagnosis not present

## 2017-04-02 MED FILL — BUTALB-CAFF-ACETAMINOPH-COD: 50-325-40-3 | 5 days supply | Qty: 30 | Fill #0

## 2017-04-02 MED FILL — SUMATRIPTAN SUCC 100 MG TAB: 100 | 30 days supply | Qty: 6 | Fill #3

## 2017-04-05 DIAGNOSIS — N401 Enlarged prostate with lower urinary tract symptoms: Secondary | ICD-10-CM | POA: Diagnosis not present

## 2017-04-05 DIAGNOSIS — N138 Other obstructive and reflux uropathy: Secondary | ICD-10-CM | POA: Diagnosis not present

## 2017-05-04 MED FILL — SUMATRIPTAN SUCC 100 MG TAB: 100 | 30 days supply | Qty: 6 | Fill #4

## 2017-05-16 MED FILL — VALSARTAN 160 MG TABLET: 160 | 90 days supply | Qty: 90 | Fill #0

## 2017-06-04 MED FILL — TAMSULOSIN HCL 0.4 MG CAP: 0.4 | 90 days supply | Qty: 90 | Fill #1

## 2017-06-20 MED FILL — SUMATRIPTAN SUCC 100 MG TAB: 100 | 30 days supply | Qty: 6 | Fill #5

## 2017-06-21 DIAGNOSIS — G43909 Migraine, unspecified, not intractable, without status migrainosus: Secondary | ICD-10-CM | POA: Diagnosis not present

## 2017-06-21 DIAGNOSIS — M109 Gout, unspecified: Secondary | ICD-10-CM | POA: Diagnosis not present

## 2017-06-21 DIAGNOSIS — Z136 Encounter for screening for cardiovascular disorders: Secondary | ICD-10-CM | POA: Diagnosis not present

## 2017-06-21 DIAGNOSIS — Z0000 Encounter for general adult medical examination without abnormal findings: Secondary | ICD-10-CM | POA: Diagnosis not present

## 2017-06-21 DIAGNOSIS — R7303 Prediabetes: Secondary | ICD-10-CM | POA: Diagnosis not present

## 2017-06-21 DIAGNOSIS — Z01118 Encounter for examination of ears and hearing with other abnormal findings: Secondary | ICD-10-CM | POA: Diagnosis not present

## 2017-06-21 DIAGNOSIS — Z131 Encounter for screening for diabetes mellitus: Secondary | ICD-10-CM | POA: Diagnosis not present

## 2017-06-21 DIAGNOSIS — I10 Essential (primary) hypertension: Secondary | ICD-10-CM | POA: Diagnosis not present

## 2017-06-21 DIAGNOSIS — E785 Hyperlipidemia, unspecified: Secondary | ICD-10-CM | POA: Diagnosis not present

## 2017-06-21 DIAGNOSIS — M25522 Pain in left elbow: Secondary | ICD-10-CM | POA: Diagnosis not present

## 2017-06-21 MED FILL — OLMESARTAN-HCTZ 40-25 MG TA: 40-25 | 90 days supply | Qty: 90 | Fill #0

## 2017-06-21 MED FILL — DICLOFENAC SOD EC 75 MG TAB: 75 | 15 days supply | Qty: 30 | Fill #0

## 2017-06-21 MED FILL — predniSONE 20 MG TABS: 20 | 5 days supply | Qty: 10 | Fill #0

## 2017-07-11 DIAGNOSIS — M7540 Impingement syndrome of unspecified shoulder: Secondary | ICD-10-CM | POA: Diagnosis not present

## 2017-07-11 DIAGNOSIS — M7022 Olecranon bursitis, left elbow: Secondary | ICD-10-CM | POA: Diagnosis not present

## 2017-07-11 MED FILL — DICLOFENAC SOD EC 75 MG TAB: 75 | 15 days supply | Qty: 30 | Fill #1

## 2017-09-04 MED FILL — TAMSULOSIN HCL 0.4 MG CAP: 0.4 | 90 days supply | Qty: 90 | Fill #2

## 2017-09-20 DIAGNOSIS — R55 Syncope and collapse: Secondary | ICD-10-CM | POA: Diagnosis not present

## 2017-09-21 DIAGNOSIS — R55 Syncope and collapse: Secondary | ICD-10-CM | POA: Diagnosis not present

## 2017-09-27 MED FILL — BUTALB-CAFF-ACETAMINOPH-COD: 50-325-40-3 | 5 days supply | Qty: 30 | Fill #0

## 2017-10-10 MED FILL — OLMESARTAN-HCTZ 40-25 MG TA: 40-25 | 90 days supply | Qty: 90 | Fill #1

## 2017-10-17 NOTE — Progress Notes (Signed)
Cardiology Office Note:    Date:  10/18/2017   ID:  Kenneth Melendez, DOB 09-17-1955, MRN [id number]  PCP:  Kenneth Plum, MD  Cardiologist:  No primary care provider on file.   Referring MD: Kenneth Plum, MD   Chief Complaint  Patient presents with  . Loss of Consciousness    History of Present Illness:    Kenneth Melendez is a 62 y.o. male with a hx of essential hypertension, prediabetes, and hyperlipidemia who is referred by Kenneth. Greggory Stallion Osei-Bonsu for evaluation of syncope.  Recent blood pressure medication manipulation 3 to 4 weeks prior to the syncopal episode with discontinuation of Diovan 160 mg/day and initiation of Benicar HCT 40/25 mg/day.  [name] has been a generally healthy gentleman without previous significant medical illness.  As noted above he does have significant risk factors for cardiac disease.  Approximately 1 month ago he had an episode of syncope.  States that he was out working in his yard, riding a Biomedical engineer grass.  After being on the tractor for approximately an hour on a relatively hot day, he had to get off the tractor, bend over to pick up debris, and then noticed that he felt "funny".  He got back on the tractor and for the next 5 to 10 minutes he felt weak, hot, and had the sensation that something was not right.  He returned home, went into his house, laid on the floor with his legs elevated and turned the air conditioning up.  He started to feel some better, got up quickly heading into his kitchen, and fainted hitting his head on the piano.  EMS was summoned by his wife Kenneth Melendez.  He awakened without difficulty.  He refused to go to the emergency room.  He has had no difficulty since that time.  He saw his primary physician, Kenneth. Julio Melendez.  Blood work that have been done after the event which demonstrated a creatinine of 1.54.  Prior creatinine in May 2019 was totally normal at 1.0.  Approximately 3 to 4 weeks prior to the episode of  syncope, the blood pressure medications were changed because valsartan from his pharmacy supplier was taken off the market due to contamination.  He was therefore switched from valsartan 160 mg/day to olmesartan HCT 40/25 mg/day.  He has tolerated this medication change without difficulty.  After the syncopal episode olmesartan has been discontinued because of concern about dehydration.  Blood pressure monitored by the patient at home has revealed all blood pressures less than 140/90 mmHg and most below the ideal target of 130/80 mmHg.  His wife interjects that after starting the new blood pressure medicine that he has occasionally complained of lightheadedness which was new.  Has a long history of bradycardia.  Cardiovascular risk factors: Prediabetes, hyperlipidemia, family history of premature atherosclerosis (mother with myocardial infarction before age 70/she was diabetic), and history of hypertension.  CT scan performed in December 2012 in the [facility] does not reveal or mention any evidence of coronary artery calcification. . Past Medical History:  Diagnosis Date  . Erectile dysfunction   . Hemorrhoids   . Hyperlipidemia   . Hypertension   . Migraine    followed by [facility]  . Pre-diabetes   . Urethral stricture     Past Surgical History:  Procedure Laterality Date  . APPENDECTOMY  1970's  . BUNIONECTOMY Right 2005  approx  . COLONOSCOPY  04/20/2009  . CYSTOSCOPY WITH URETHRAL DILATATION N/A 01/17/2016  Procedure: CYSTOSCOPY WITH URETHRAL  DILATATION;  Surgeon: [name], MD;  Location: [facility];  Service: Urology;  Laterality: N/A;  . HEMORRHOIDECTOMY WITH HEMORRHOID BANDING  06/14/2010  . INGUINAL HERNIA REPAIR Left 1996  approx  . SHOULDER ARTHROSCOPY  01/02/2011   Procedure: ARTHROSCOPY SHOULDER;  Surgeon: [name];  Location: [facility] OR;  Service: Orthopedics;  Laterality: Left;  . SUPRAUMBILICAL HERNIA REPAIR W/ MESH  05/14/2009     Current Medications: Current Meds  Medication Sig  . butalbital-acetaminophen-caffeine (FIORICET WITH CODEINE) 50-325-40-30 MG per capsule Take 1 capsule by mouth every 4 (four) hours as needed for headache.  . Cinnamon 500 MG capsule Take 500 mg by mouth 2 (two) times daily.    . Multiple Vitamin (MULITIVITAMIN WITH MINERALS) TABS Take 1 tablet by mouth daily.    . SUMAtriptan (IMITREX) 100 MG tablet Take 1 tablet (100 mg total) by mouth every 2 (two) hours as needed. For migraine  . tadalafil (CIALIS) 20 MG tablet Take 20 mg by mouth daily as needed for erectile dysfunction.  . tamsulosin (FLOMAX) 0.4 MG CAPS capsule Take 0.4 mg by mouth daily after breakfast.      Allergies:   Patient has no known allergies.   Social History   Socioeconomic History  . Marital status: Married    Spouse name: Not on file  . Number of children: Not on file  . Years of education: Not on file  . Highest education level: Not on file  Occupational History  . Occupation: Retired   Engineer, production  . Financial resource strain: Not on file  . Food insecurity:    Worry: Not on file    Inability: Not on file  . Transportation needs:    Medical: Not on file    Non-medical: Not on file  Tobacco Use  . Smoking status: Never Smoker  . Smokeless tobacco: Never Used  Substance and Sexual Activity  . Alcohol use: Yes    Alcohol/week: 0.0 standard drinks    Comment: social   . Drug use: No  . Sexual activity: Not on file  Lifestyle  . Physical activity:    Days per week: Not on file    Minutes per session: Not on file  . Stress: Not on file  Relationships  . Social connections:    Talks on phone: Not on file    Gets together: Not on file    Attends religious service: Not on file    Active member of club or organization: Not on file    Attends meetings of clubs or organizations: Not on file    Relationship status: Not on file  Other Topics Concern  . Not on file  Social History Narrative  .  Not on file     Family History: The patient's family history includes Diabetes in his mother; Heart attack in his father and mother; Heart disease in his mother; Hypotension in his mother. There is no history of Anesthesia problems, Malignant hyperthermia, or Pseudochol deficiency.  ROS:   Please see the history of present illness.    No prior history of syncope.  History of excessive sweating.  All other systems reviewed and are negative.  EKGs/Labs/Other Studies Reviewed:    The following studies were reviewed today: No  EKG:  EKG is  ordered today.  The ekg ordered today demonstrates marked sinus bradycardia at 43 bpm.  The EKG tracing is otherwise normal.  Recent Labs: No results found for requested  labs within last 8760 hours.  Recent Lipid Panel No results found for: CHOL, TRIG, HDL, CHOLHDL, VLDL, LDLCALC, LDLDIRECT  Physical Exam:    VS:  BP 118/84   Pulse (!) 43   Ht 6\' 3"  (1.905 m)   Wt 223 lb 12.8 oz (101.5 kg)   BMI 27.97 kg/m     Wt Readings from Last 3 Encounters:  10/18/17 223 lb 12.8 oz (101.5 kg)  01/17/16 220 lb (99.8 kg)  08/23/15 223 lb 9 oz (101.4 kg)     GEN:  Well nourished, well developed in no acute distress HEENT: Normal NECK: No JVD. LYMPHATICS: No lymphadenopathy CARDIAC: RRR, 1/6 right upper sternal border systolic murmur, no gallop, no edema. VASCULAR: 2+ symmetrical carotid, radial, and dorsalis pedis pulses.  No carotid bruits. RESPIRATORY:  Clear to auscultation without rales, wheezing or rhonchi  ABDOMEN: Soft, non-tender, non-distended, No pulsatile mass, MUSCULOSKELETAL: No deformity  SKIN: Warm and dry NEUROLOGIC:  Alert and oriented x 3 PSYCHIATRIC:  Normal affect   ASSESSMENT:    1. Syncope, unspecified syncope type   2. Bradycardia   3. Essential hypertension   4. Other hyperlipidemia   5. Family history of early CAD   6. Prediabetes    PLAN:    In order of problems listed above:  1. Syncope in the circumstances  likely related to dehydration/orthostasis.  Bradycardia noted below is probably an innocent bystander.  Genesis of the dehydration and orthostasis is likely the relatively recent change in his blood pressure regimen adding diuretic therapy which he had not previously received.  Laboratory data done after the fainting episode revealed a creatinine of 1.56 which suggests dehydration.  He has been off of all antihypertensive therapy since his syncopal episode, and based upon his monitoring at home and in the office today has blood pressures less than 130/80 mmHg.  He had no neurally mediated symptoms that accompany the episode: Nausea, diaphoresis, etc.  No prior history of fainting.  I do not believe further work-up is necessary. 2. 24-hour Holter monitor with the patient instructed to be active to determine if there is chronotropic incompetence which could be an indication for permanent pacemaker therapy and potentially explain an additional contribution to the recent syncopal episode. 3. Blood pressure target 130/80 mmHg.  On no therapy today his blood pressure is excellent recorded at 118/84 without any evidence of significant orthostasis.  Current data suggests that he may not actually have hypertension since he is not currently on therapy. 4. Lipid panel is atherogenic -with total of 220, HDL of 44, and LDL of 132 in May 2019.  His other risk factors which include family history, male sex, prediabetes, institution of statin therapy to achieve an LDL cholesterol of 70 is indicated.  I recommend that he be started on low intensity statin therapy in the form of Crestor or atorvastatin, 5 or 10 mg respectively per day with a follow-up liver and lipid panel in 6 weeks after initiation of therapy. 5. Not addressed 6. We discussed the importance of keeping hemoglobin A1c less than 7 and preferably less than 6.  I do not believe we need to restrict his physical activity.  Other than the Holter monitor, I do not  believe further cardiac work-up is needed at this time.  If he has chronotropic incompetence we may need further discussion.  Monitor blood pressure closely and if consistent elevation above 140/90 mmHg would reinstitute therapy with an ARB given his prediabetes.  Would probably not use  concomitant diuretic therapy.   Medication Adjustments/Labs and Tests Ordered: Current medicines are reviewed at length with the patient today.  Concerns regarding medicines are outlined above.  Orders Placed This Encounter  Procedures  . Holter monitor - 24 hour  . EKG 12-Lead   No orders of the defined types were placed in this encounter.   Patient Instructions  Medication Instructions:  Your physician recommends that you continue on your current medications as directed. Please refer to the Current Medication list given to you today.  Labwork: None  Testing/Procedures: Your physician has recommended that you wear a 24 hour holter monitor. Holter monitors are medical devices that record the heart's electrical activity. Doctors most often use these monitors to diagnose arrhythmias. Arrhythmias are problems with the speed or rhythm of the heartbeat. The monitor is a small, portable device. You can wear one while you do your normal daily activities. This is usually used to diagnose what is causing palpitations/syncope (passing out).    Follow-Up: Your physician recommends that you schedule a follow-up appointment as needed with Kenneth. Katrinka Blazing.    Any Other Special Instructions Will Be Listed Below (If Applicable).     If you need a refill on your cardiac medications before your next appointment, please call your pharmacy.      Signed, Lesleigh Noe, MD  10/18/2017 12:50 PM    [facility]

## 2017-10-18 ENCOUNTER — Ambulatory Visit (INDEPENDENT_AMBULATORY_CARE_PROVIDER_SITE_OTHER): Payer: BLUE CROSS/BLUE SHIELD | Admitting: Interventional Cardiology

## 2017-10-18 ENCOUNTER — Encounter: Payer: Self-pay | Admitting: Interventional Cardiology

## 2017-10-18 VITALS — BP 118/84 | HR 43 | Ht 75.0 in | Wt 223.8 lb

## 2017-10-18 DIAGNOSIS — E7849 Other hyperlipidemia: Secondary | ICD-10-CM

## 2017-10-18 DIAGNOSIS — I10 Essential (primary) hypertension: Secondary | ICD-10-CM

## 2017-10-18 DIAGNOSIS — Z8249 Family history of ischemic heart disease and other diseases of the circulatory system: Secondary | ICD-10-CM

## 2017-10-18 DIAGNOSIS — R001 Bradycardia, unspecified: Secondary | ICD-10-CM

## 2017-10-18 DIAGNOSIS — R55 Syncope and collapse: Secondary | ICD-10-CM

## 2017-10-18 DIAGNOSIS — R7303 Prediabetes: Secondary | ICD-10-CM

## 2017-10-18 NOTE — Patient Instructions (Signed)
Medication Instructions:  Your physician recommends that you continue on your current medications as directed. Please refer to the Current Medication list given to you today.  Labwork: None  Testing/Procedures: Your physician has recommended that you wear a 24 hour holter monitor. Holter monitors are medical devices that record the heart's electrical activity. Doctors most often use these monitors to diagnose arrhythmias. Arrhythmias are problems with the speed or rhythm of the heartbeat. The monitor is a small, portable device. You can wear one while you do your normal daily activities. This is usually used to diagnose what is causing palpitations/syncope (passing out).    Follow-Up: Your physician recommends that you schedule a follow-up appointment as needed with Dr. Katrinka BlazingSmith.    Any Other Special Instructions Will Be Listed Below (If Applicable).     If you need a refill on your cardiac medications before your next appointment, please call your pharmacy.

## 2017-10-24 ENCOUNTER — Ambulatory Visit (INDEPENDENT_AMBULATORY_CARE_PROVIDER_SITE_OTHER): Payer: BLUE CROSS/BLUE SHIELD

## 2017-10-24 DIAGNOSIS — R55 Syncope and collapse: Secondary | ICD-10-CM

## 2017-10-24 DIAGNOSIS — R001 Bradycardia, unspecified: Secondary | ICD-10-CM | POA: Diagnosis not present

## 2017-10-26 DIAGNOSIS — R7303 Prediabetes: Secondary | ICD-10-CM | POA: Diagnosis not present

## 2017-10-26 DIAGNOSIS — I10 Essential (primary) hypertension: Secondary | ICD-10-CM | POA: Diagnosis not present

## 2017-11-05 DIAGNOSIS — Z23 Encounter for immunization: Secondary | ICD-10-CM | POA: Diagnosis not present

## 2017-11-21 ENCOUNTER — Encounter

## 2017-11-21 ENCOUNTER — Ambulatory Visit: Payer: BLUE CROSS/BLUE SHIELD | Admitting: Interventional Cardiology

## 2017-11-23 DIAGNOSIS — I10 Essential (primary) hypertension: Secondary | ICD-10-CM | POA: Diagnosis not present

## 2017-11-23 DIAGNOSIS — R7303 Prediabetes: Secondary | ICD-10-CM | POA: Diagnosis not present

## 2017-11-23 DIAGNOSIS — E7800 Pure hypercholesterolemia, unspecified: Secondary | ICD-10-CM | POA: Diagnosis not present

## 2017-11-23 DIAGNOSIS — K219 Gastro-esophageal reflux disease without esophagitis: Secondary | ICD-10-CM | POA: Diagnosis not present

## 2017-12-06 MED FILL — BUTALB-CAFF-ACETAMINOPH-COD: 50-325-40-3 | 5 days supply | Qty: 30 | Fill #0

## 2017-12-14 MED FILL — TAMSULOSIN HCL 0.4 MG CAP: 0.4 | 90 days supply | Qty: 90 | Fill #0

## 2017-12-14 MED FILL — PROMETHAZINE 25 MG TABLET: 25 | 7 days supply | Qty: 30 | Fill #1

## 2017-12-21 ENCOUNTER — Encounter: Payer: BLUE CROSS/BLUE SHIELD | Admitting: Internal Medicine

## 2017-12-24 DIAGNOSIS — N138 Other obstructive and reflux uropathy: Secondary | ICD-10-CM | POA: Diagnosis not present

## 2017-12-24 DIAGNOSIS — N401 Enlarged prostate with lower urinary tract symptoms: Secondary | ICD-10-CM | POA: Diagnosis not present

## 2017-12-28 ENCOUNTER — Ambulatory Visit: Payer: BLUE CROSS/BLUE SHIELD | Admitting: Internal Medicine

## 2017-12-28 ENCOUNTER — Encounter: Payer: Self-pay | Admitting: Internal Medicine

## 2017-12-28 DIAGNOSIS — Z7184 Encounter for health counseling related to travel: Secondary | ICD-10-CM | POA: Diagnosis not present

## 2017-12-28 DIAGNOSIS — Z298 Encounter for other specified prophylactic measures: Secondary | ICD-10-CM

## 2017-12-28 DIAGNOSIS — Z2989 Encounter for other specified prophylactic measures: Secondary | ICD-10-CM

## 2017-12-28 DIAGNOSIS — Z7189 Other specified counseling: Secondary | ICD-10-CM

## 2017-12-28 DIAGNOSIS — Z23 Encounter for immunization: Secondary | ICD-10-CM | POA: Diagnosis not present

## 2017-12-28 DIAGNOSIS — Z7185 Encounter for immunization safety counseling: Secondary | ICD-10-CM

## 2017-12-28 DIAGNOSIS — Z9189 Other specified personal risk factors, not elsewhere classified: Secondary | ICD-10-CM

## 2017-12-28 DIAGNOSIS — Z789 Other specified health status: Secondary | ICD-10-CM

## 2017-12-28 MED ORDER — ATOVAQUONE-PROGUANIL HCL 250-100 MG PO TABS: 1.0000 | ORAL_TABLET | Freq: Every day | ORAL | 0 refills | Status: DC

## 2017-12-28 MED ORDER — AZITHROMYCIN 500 MG PO TABS: 1000.0000 mg | ORAL_TABLET | Freq: Once | ORAL | 0 refills | Status: AC

## 2017-12-28 NOTE — Progress Notes (Signed)
Patient ID: Kenneth Melendez, male   DOB: 07-06-1955, 62 y.o.   MRN: [id number] Subjective:   Kenneth Melendez is a 62 y.o. male who presents to the Infectious Disease clinic for travel consultation. Planned departure date: December 24          Planned return date: February 11, 2018 Countries of travel: [place] Areas in country: urban   Accommodations: private home Purpose of travel: family visit Prior travel out of [place]: yes     Objective:   Medications: reviewed   Assessment:   No contraindications to travel. none     Plan:    Issues discussed: environmental concerns, freshwater swimming, future shots, insect-borne illnesses, malaria, MVA safety, rabies, safe food/water, traveler's diarrhea, website/handouts for more information, what to do if ill upon return, what to do if ill while there and Yellow Fever. Immunizations recommended: Hepatitis A series, Td and Typhoid (parenteral). Malaria prophylaxis: malarone, daily dose starting 1-2 days before entering endemic area, ending 7 days after leaving area Traveler's diarrhea prophylaxis: azithromycin. Total duration of visit: 1 Hour. Total time spent on education, counseling, coordination of care: 30 Minutes.

## 2018-01-08 ENCOUNTER — Telehealth: Payer: Self-pay | Admitting: *Deleted

## 2018-01-08 NOTE — Telephone Encounter (Signed)
Patient's pharmacy never received order for shingrix vaccine. Please send order as appropriate to [facility]. Andree CossHowell, Michelle M, RN

## 2018-01-09 ENCOUNTER — Other Ambulatory Visit: Payer: Self-pay | Admitting: Internal Medicine

## 2018-01-09 MED ORDER — ZOSTER VAC RECOMB ADJUVANTED 50 MCG/0.5ML IM SUSR: 0.5000 mL | Freq: Once | INTRAMUSCULAR | 1 refills | Status: AC

## 2018-01-09 MED FILL — SHINGRIX 50 MCG SUS: 50 | 1 days supply | Qty: 1 | Fill #0

## 2018-01-18 MED FILL — ATOVAQUONE-PROGUANIL 250-10: 250-100 | 23 days supply | Qty: 23 | Fill #0

## 2018-01-18 MED FILL — AZITHROMYCIN 500 MG TABLET: 500 | 2 days supply | Qty: 4 | Fill #0

## 2018-01-24 MED FILL — BUTALB-CAFF-ACETAMINOPH-COD: 50-325-40-3 | 5 days supply | Qty: 30 | Fill #0

## 2018-03-14 DIAGNOSIS — R05 Cough: Secondary | ICD-10-CM | POA: Diagnosis not present

## 2018-03-14 DIAGNOSIS — I10 Essential (primary) hypertension: Secondary | ICD-10-CM | POA: Diagnosis not present

## 2018-03-14 DIAGNOSIS — E7800 Pure hypercholesterolemia, unspecified: Secondary | ICD-10-CM | POA: Diagnosis not present

## 2018-03-14 DIAGNOSIS — R509 Fever, unspecified: Secondary | ICD-10-CM | POA: Diagnosis not present

## 2018-03-14 MED FILL — AZITHROMYCIN 250 MG TABLET: 250 | 5 days supply | Qty: 6 | Fill #0

## 2018-03-14 MED FILL — OSELTAMIVIR PHOSPHATE 75 MG: 75 | 5 days supply | Qty: 10 | Fill #0

## 2018-03-14 MED FILL — BUTALB-CAFF-ACETAMINOPH-COD: 50-325-40-3 | 5 days supply | Qty: 30 | Fill #0

## 2018-03-25 MED FILL — TAMSULOSIN HCL 0.4 MG CAP: 0.4 | 90 days supply | Qty: 90 | Fill #1

## 2018-03-27 MED FILL — SHINGRIX 50 MCG SUS: 50 | 1 days supply | Qty: 1 | Fill #1

## 2018-04-24 MED FILL — BUTALB-CAFF-ACETAMINOPH-COD: 50-325-40-3 | 5 days supply | Qty: 30 | Fill #0

## 2018-04-24 MED FILL — OSELTAMIVIR PHOSPHATE 75 MG: 75 | 5 days supply | Qty: 10 | Fill #0

## 2018-06-07 MED FILL — BUTALB-CAFF-ACETAMINOPH-COD: 50-325-40-3 | 5 days supply | Qty: 30 | Fill #1

## 2018-06-24 DIAGNOSIS — N401 Enlarged prostate with lower urinary tract symptoms: Secondary | ICD-10-CM | POA: Diagnosis not present

## 2018-06-24 DIAGNOSIS — N138 Other obstructive and reflux uropathy: Secondary | ICD-10-CM | POA: Diagnosis not present

## 2018-06-24 DIAGNOSIS — R358 Other polyuria: Secondary | ICD-10-CM | POA: Diagnosis not present

## 2018-06-24 MED FILL — TAMSULOSIN HCL 0.4 MG CAP: 0.4 | 30 days supply | Qty: 30 | Fill #0

## 2018-07-08 DIAGNOSIS — E7800 Pure hypercholesterolemia, unspecified: Secondary | ICD-10-CM | POA: Diagnosis not present

## 2018-07-08 DIAGNOSIS — Z0001 Encounter for general adult medical examination with abnormal findings: Secondary | ICD-10-CM | POA: Diagnosis not present

## 2018-07-08 DIAGNOSIS — K219 Gastro-esophageal reflux disease without esophagitis: Secondary | ICD-10-CM | POA: Diagnosis not present

## 2018-07-08 DIAGNOSIS — Z131 Encounter for screening for diabetes mellitus: Secondary | ICD-10-CM | POA: Diagnosis not present

## 2018-07-08 DIAGNOSIS — R7303 Prediabetes: Secondary | ICD-10-CM | POA: Diagnosis not present

## 2018-07-08 DIAGNOSIS — Z1329 Encounter for screening for other suspected endocrine disorder: Secondary | ICD-10-CM | POA: Diagnosis not present

## 2018-07-08 DIAGNOSIS — Z136 Encounter for screening for cardiovascular disorders: Secondary | ICD-10-CM | POA: Diagnosis not present

## 2018-07-08 DIAGNOSIS — Z1389 Encounter for screening for other disorder: Secondary | ICD-10-CM | POA: Diagnosis not present

## 2018-07-08 DIAGNOSIS — I10 Essential (primary) hypertension: Secondary | ICD-10-CM | POA: Diagnosis not present

## 2018-07-08 DIAGNOSIS — Z01118 Encounter for examination of ears and hearing with other abnormal findings: Secondary | ICD-10-CM | POA: Diagnosis not present

## 2018-07-08 MED FILL — PROMETHAZINE 25 MG TABLET: 25 | 10 days supply | Qty: 30 | Fill #0

## 2018-07-15 MED FILL — BUTALB-CAFF-ACETAMINOPH-COD: 50-325-40-3 | 5 days supply | Qty: 30 | Fill #2

## 2018-08-06 MED FILL — SUMATRIPTAN SUCC 100 MG TAB: 100 | 3 days supply | Qty: 6 | Fill #0

## 2018-09-02 MED FILL — SUMATRIPTAN SUCC 100 MG TAB: 100 | 3 days supply | Qty: 6 | Fill #1

## 2018-09-02 MED FILL — BUTALB-CAFF-ACETAMINOPH-COD: 50-325-40-3 | 5 days supply | Qty: 30 | Fill #3

## 2018-10-10 MED FILL — SUMATRIPTAN SUCC 100 MG TAB: 100 | 3 days supply | Qty: 6 | Fill #2

## 2018-10-21 DIAGNOSIS — K625 Hemorrhage of anus and rectum: Secondary | ICD-10-CM | POA: Diagnosis not present

## 2018-10-21 DIAGNOSIS — Z1211 Encounter for screening for malignant neoplasm of colon: Secondary | ICD-10-CM | POA: Diagnosis not present

## 2018-10-21 MED FILL — PLENVU 140 GM SOLR: 140 | 1 days supply | Qty: 3 | Fill #0

## 2018-10-21 MED FILL — SUMATRIPTAN SUCC 100 MG TAB: 100 | 3 days supply | Qty: 6 | Fill #2

## 2018-11-11 DIAGNOSIS — I10 Essential (primary) hypertension: Secondary | ICD-10-CM | POA: Diagnosis not present

## 2018-11-11 DIAGNOSIS — E7800 Pure hypercholesterolemia, unspecified: Secondary | ICD-10-CM | POA: Diagnosis not present

## 2018-11-11 DIAGNOSIS — R7303 Prediabetes: Secondary | ICD-10-CM | POA: Diagnosis not present

## 2018-11-11 DIAGNOSIS — K219 Gastro-esophageal reflux disease without esophagitis: Secondary | ICD-10-CM | POA: Diagnosis not present

## 2018-11-11 DIAGNOSIS — Z23 Encounter for immunization: Secondary | ICD-10-CM | POA: Diagnosis not present

## 2018-11-14 MED FILL — PLENVU 140 GM SOLR: 140 | 1 days supply | Qty: 3 | Fill #0

## 2018-12-18 MED FILL — SUMATRIPTAN SUCC 100 MG TAB: 100 | 3 days supply | Qty: 6 | Fill #2

## 2018-12-18 MED FILL — PROMETHAZINE 25 MG TABLET: 25 | 10 days supply | Qty: 30 | Fill #1

## 2018-12-18 MED FILL — PLENVU 140 GM SOLR: 140 | 1 days supply | Qty: 3 | Fill #0

## 2018-12-23 DIAGNOSIS — N138 Other obstructive and reflux uropathy: Secondary | ICD-10-CM | POA: Diagnosis not present

## 2018-12-23 DIAGNOSIS — R339 Retention of urine, unspecified: Secondary | ICD-10-CM | POA: Diagnosis not present

## 2018-12-23 DIAGNOSIS — N401 Enlarged prostate with lower urinary tract symptoms: Secondary | ICD-10-CM | POA: Diagnosis not present

## 2018-12-26 DIAGNOSIS — Z1211 Encounter for screening for malignant neoplasm of colon: Secondary | ICD-10-CM | POA: Diagnosis not present

## 2018-12-26 DIAGNOSIS — D122 Benign neoplasm of ascending colon: Secondary | ICD-10-CM | POA: Diagnosis not present

## 2018-12-26 DIAGNOSIS — K635 Polyp of colon: Secondary | ICD-10-CM | POA: Diagnosis not present

## 2019-01-08 DIAGNOSIS — R0789 Other chest pain: Secondary | ICD-10-CM | POA: Diagnosis not present

## 2019-01-08 DIAGNOSIS — M940 Chondrocostal junction syndrome [Tietze]: Secondary | ICD-10-CM | POA: Diagnosis not present

## 2019-01-08 DIAGNOSIS — I10 Essential (primary) hypertension: Secondary | ICD-10-CM | POA: Diagnosis not present

## 2019-01-08 DIAGNOSIS — E7800 Pure hypercholesterolemia, unspecified: Secondary | ICD-10-CM | POA: Diagnosis not present

## 2019-01-08 MED FILL — DICLOFENAC SODIUM 75 MG TAB: 75 | 15 days supply | Qty: 30 | Fill #0

## 2019-01-08 MED FILL — predniSONE 20 MG TABS: 20 | 5 days supply | Qty: 5 | Fill #0

## 2019-01-13 MED FILL — predniSONE 20 MG TABS: 20 | 5 days supply | Qty: 5 | Fill #0

## 2019-01-13 MED FILL — DICLOFENAC SODIUM 75 MG TAB: 75 | 15 days supply | Qty: 30 | Fill #0

## 2019-03-15 DIAGNOSIS — F4323 Adjustment disorder with mixed anxiety and depressed mood: Secondary | ICD-10-CM | POA: Diagnosis not present

## 2019-04-24 DIAGNOSIS — F4323 Adjustment disorder with mixed anxiety and depressed mood: Secondary | ICD-10-CM | POA: Diagnosis not present

## 2019-06-23 DIAGNOSIS — R358 Other polyuria: Secondary | ICD-10-CM | POA: Diagnosis not present

## 2019-06-23 DIAGNOSIS — N401 Enlarged prostate with lower urinary tract symptoms: Secondary | ICD-10-CM | POA: Diagnosis not present

## 2019-06-23 DIAGNOSIS — N138 Other obstructive and reflux uropathy: Secondary | ICD-10-CM | POA: Diagnosis not present

## 2019-07-14 DIAGNOSIS — Z1329 Encounter for screening for other suspected endocrine disorder: Secondary | ICD-10-CM | POA: Diagnosis not present

## 2019-07-14 DIAGNOSIS — E7800 Pure hypercholesterolemia, unspecified: Secondary | ICD-10-CM | POA: Diagnosis not present

## 2019-07-14 DIAGNOSIS — R7303 Prediabetes: Secondary | ICD-10-CM | POA: Diagnosis not present

## 2019-07-14 DIAGNOSIS — I10 Essential (primary) hypertension: Secondary | ICD-10-CM | POA: Diagnosis not present

## 2019-07-14 DIAGNOSIS — K219 Gastro-esophageal reflux disease without esophagitis: Secondary | ICD-10-CM | POA: Diagnosis not present

## 2019-07-14 DIAGNOSIS — Z0001 Encounter for general adult medical examination with abnormal findings: Secondary | ICD-10-CM | POA: Diagnosis not present

## 2019-07-14 DIAGNOSIS — Z01118 Encounter for examination of ears and hearing with other abnormal findings: Secondary | ICD-10-CM | POA: Diagnosis not present

## 2019-07-14 DIAGNOSIS — Z131 Encounter for screening for diabetes mellitus: Secondary | ICD-10-CM | POA: Diagnosis not present

## 2019-07-14 DIAGNOSIS — Z1389 Encounter for screening for other disorder: Secondary | ICD-10-CM | POA: Diagnosis not present

## 2019-07-14 DIAGNOSIS — Z125 Encounter for screening for malignant neoplasm of prostate: Secondary | ICD-10-CM | POA: Diagnosis not present

## 2019-07-14 DIAGNOSIS — Z136 Encounter for screening for cardiovascular disorders: Secondary | ICD-10-CM | POA: Diagnosis not present

## 2019-10-20 ENCOUNTER — Other Ambulatory Visit (HOSPITAL_BASED_OUTPATIENT_CLINIC_OR_DEPARTMENT_OTHER): Payer: Self-pay | Admitting: Internal Medicine

## 2019-10-20 DIAGNOSIS — E7800 Pure hypercholesterolemia, unspecified: Secondary | ICD-10-CM | POA: Diagnosis not present

## 2019-10-20 DIAGNOSIS — I10 Essential (primary) hypertension: Secondary | ICD-10-CM | POA: Diagnosis not present

## 2019-10-20 DIAGNOSIS — K219 Gastro-esophageal reflux disease without esophagitis: Secondary | ICD-10-CM | POA: Diagnosis not present

## 2019-10-20 DIAGNOSIS — R7303 Prediabetes: Secondary | ICD-10-CM | POA: Diagnosis not present

## 2019-10-20 MED FILL — BUTALB-ACETAMINOPH-CAFF-COD: 50-300-40-3 | 5 days supply | Qty: 30 | Fill #0

## 2019-11-11 ENCOUNTER — Ambulatory Visit: Payer: Self-pay | Attending: Internal Medicine

## 2019-11-11 DIAGNOSIS — Z23 Encounter for immunization: Secondary | ICD-10-CM

## 2019-11-11 NOTE — Progress Notes (Signed)
° °  Covid-19 Vaccination Clinic  Name:  Kenneth Melendez    MRN: [id number] DOB: 04-Aug-1955  11/11/2019  [name] was observed post Covid-19 immunization for 15 minutes without incident. He was provided with Vaccine Information Sheet and instruction to access the V-Safe system.   [name] was instructed to call 911 with any severe reactions post vaccine:  Difficulty breathing   Swelling of face and throat   A fast heartbeat   A bad rash all over body   Dizziness and weakness

## 2019-11-17 ENCOUNTER — Other Ambulatory Visit (HOSPITAL_BASED_OUTPATIENT_CLINIC_OR_DEPARTMENT_OTHER): Payer: Self-pay | Admitting: Internal Medicine

## 2019-11-17 DIAGNOSIS — E7800 Pure hypercholesterolemia, unspecified: Secondary | ICD-10-CM | POA: Diagnosis not present

## 2019-11-17 DIAGNOSIS — I10 Essential (primary) hypertension: Secondary | ICD-10-CM | POA: Diagnosis not present

## 2019-11-17 DIAGNOSIS — R7303 Prediabetes: Secondary | ICD-10-CM | POA: Diagnosis not present

## 2019-11-17 DIAGNOSIS — K219 Gastro-esophageal reflux disease without esophagitis: Secondary | ICD-10-CM | POA: Diagnosis not present

## 2019-11-18 MED FILL — SUMATRIPTAN SUCC 100 MG TAB: 100 | 3 days supply | Qty: 6 | Fill #0

## 2019-11-18 MED FILL — LOSARTAN POTASSIUM 50 MG TA: 50 | 30 days supply | Qty: 30 | Fill #0

## 2019-12-16 MED FILL — SUMATRIPTAN SUCC 100 MG TAB: 100 | 3 days supply | Qty: 6 | Fill #1

## 2019-12-16 MED FILL — BUTALB-ACETAMINOPH-CAFF-COD: 50-300-40-3 | 5 days supply | Qty: 30 | Fill #1

## 2019-12-18 ENCOUNTER — Other Ambulatory Visit (HOSPITAL_BASED_OUTPATIENT_CLINIC_OR_DEPARTMENT_OTHER): Payer: Self-pay | Admitting: Internal Medicine

## 2019-12-18 MED FILL — FLUAD QUADRIVALENT 0.5 ML P: 0.5 | 1 days supply | Qty: 1 | Fill #0

## 2019-12-25 MED FILL — LOSARTAN POTASSIUM 50 MG TA: 50 | 90 days supply | Qty: 90 | Fill #1

## 2019-12-29 MED FILL — SUMATRIPTAN SUCC 100 MG TAB: 100 | 3 days supply | Qty: 6 | Fill #2

## 2020-01-19 DIAGNOSIS — K219 Gastro-esophageal reflux disease without esophagitis: Secondary | ICD-10-CM | POA: Diagnosis not present

## 2020-01-19 DIAGNOSIS — E7800 Pure hypercholesterolemia, unspecified: Secondary | ICD-10-CM | POA: Diagnosis not present

## 2020-01-19 DIAGNOSIS — R7303 Prediabetes: Secondary | ICD-10-CM | POA: Diagnosis not present

## 2020-01-19 DIAGNOSIS — I10 Essential (primary) hypertension: Secondary | ICD-10-CM | POA: Diagnosis not present

## 2020-01-19 MED FILL — SUMATRIPTAN SUCC 100 MG TAB: 100 | 3 days supply | Qty: 6 | Fill #3

## 2020-01-19 MED FILL — BUTALB-ACETAMINOPH-CAFF-COD: 50-300-40-3 | 5 days supply | Qty: 30 | Fill #2

## 2020-01-29 ENCOUNTER — Other Ambulatory Visit (HOSPITAL_BASED_OUTPATIENT_CLINIC_OR_DEPARTMENT_OTHER): Payer: Self-pay | Admitting: Internal Medicine

## 2020-01-29 MED FILL — ATORVASTATIN CALCIUM 10 MG: 10 | 90 days supply | Qty: 90 | Fill #0

## 2020-01-29 MED FILL — SUMATRIPTAN SUCC 100 MG TAB: 100 | 3 days supply | Qty: 6 | Fill #4

## 2020-02-12 MED FILL — BUTALB-ACETAMINOPH-CAFF-COD: 50-300-40-3 | 30 days supply | Qty: 18 | Fill #3

## 2020-03-01 ENCOUNTER — Other Ambulatory Visit (HOSPITAL_BASED_OUTPATIENT_CLINIC_OR_DEPARTMENT_OTHER): Payer: Self-pay | Admitting: Internal Medicine

## 2020-03-01 DIAGNOSIS — R197 Diarrhea, unspecified: Secondary | ICD-10-CM | POA: Diagnosis not present

## 2020-03-01 DIAGNOSIS — I10 Essential (primary) hypertension: Secondary | ICD-10-CM | POA: Diagnosis not present

## 2020-03-01 DIAGNOSIS — A09 Infectious gastroenteritis and colitis, unspecified: Secondary | ICD-10-CM | POA: Diagnosis not present

## 2020-03-01 MED FILL — CIPROFLOXACIN HCL 500 MG TA: 500 | 10 days supply | Qty: 20 | Fill #0

## 2020-03-01 MED FILL — LOSARTAN POTASSIUM 100 MG T: 100 | 90 days supply | Qty: 90 | Fill #0

## 2020-03-19 MED FILL — SUMATRIPTAN SUCC 100 MG TAB: 100 | 3 days supply | Qty: 6 | Fill #5

## 2020-04-30 ENCOUNTER — Other Ambulatory Visit (HOSPITAL_BASED_OUTPATIENT_CLINIC_OR_DEPARTMENT_OTHER): Payer: Self-pay | Admitting: Internal Medicine

## 2020-04-30 MED FILL — SUMATRIPTAN SUCCINATE 100 M: 100 | 3 days supply | Qty: 6 | Fill #0

## 2020-05-08 ENCOUNTER — Other Ambulatory Visit (HOSPITAL_BASED_OUTPATIENT_CLINIC_OR_DEPARTMENT_OTHER): Payer: Self-pay | Admitting: Internal Medicine

## 2020-05-08 ENCOUNTER — Other Ambulatory Visit (HOSPITAL_BASED_OUTPATIENT_CLINIC_OR_DEPARTMENT_OTHER): Payer: Self-pay

## 2020-05-14 ENCOUNTER — Other Ambulatory Visit (HOSPITAL_BASED_OUTPATIENT_CLINIC_OR_DEPARTMENT_OTHER): Payer: Self-pay

## 2020-05-14 DIAGNOSIS — M5441 Lumbago with sciatica, right side: Secondary | ICD-10-CM | POA: Diagnosis not present

## 2020-05-14 DIAGNOSIS — G8929 Other chronic pain: Secondary | ICD-10-CM | POA: Diagnosis not present

## 2020-05-14 MED ORDER — TRAMADOL HCL 50 MG PO TABS
ORAL_TABLET | ORAL | 0 refills | Status: DC
  Filled 2020-05-14: qty 60, 15d supply, fill #0

## 2020-05-17 ENCOUNTER — Other Ambulatory Visit (HOSPITAL_BASED_OUTPATIENT_CLINIC_OR_DEPARTMENT_OTHER): Payer: Self-pay

## 2020-05-17 MED ORDER — BUTALBITAL-APAP-CAFF-COD 50-300-40-30 MG PO CAPS
ORAL_CAPSULE | ORAL | 3 refills | Status: DC
  Filled 2020-05-17: qty 30, 19d supply, fill #0
  Filled 2020-07-07: qty 30, 19d supply, fill #1
  Filled 2020-07-30: qty 30, 19d supply, fill #2
  Filled 2020-09-02: qty 30, 19d supply, fill #3

## 2020-05-17 MED ORDER — SUMATRIPTAN SUCCINATE 100 MG PO TABS
ORAL_TABLET | ORAL | 5 refills | Status: DC
  Filled 2020-05-17 – 2020-09-02 (×2): qty 6, 3d supply, fill #0
  Filled 2020-09-26: qty 6, 3d supply, fill #1

## 2020-05-18 ENCOUNTER — Other Ambulatory Visit (HOSPITAL_BASED_OUTPATIENT_CLINIC_OR_DEPARTMENT_OTHER): Payer: Self-pay

## 2020-05-24 ENCOUNTER — Ambulatory Visit: Payer: Self-pay | Attending: Internal Medicine

## 2020-05-24 DIAGNOSIS — Z23 Encounter for immunization: Secondary | ICD-10-CM

## 2020-05-24 NOTE — Progress Notes (Signed)
   Covid-19 Vaccination Clinic  Name:  Kenneth Melendez    MRN: [id number] DOB: 09-11-1955  05/24/2020  [name] was observed post Covid-19 immunization for 15 minutes without incident. He was provided with Vaccine Information Sheet and instruction to access the V-Safe system.   [name] was instructed to call 911 with any severe reactions post vaccine: Marland Kitchen Difficulty breathing  . Swelling of face and throat  . A fast heartbeat  . A bad rash all over body  . Dizziness and weakness   Immunizations Administered    Name Date Dose VIS Date Route   PFIZER Comrnaty(Gray TOP) Covid-19 Vaccine 05/24/2020  2:57 PM 0.3 mL 01/15/2020 Intramuscular   Manufacturer: [employer], [employer]   Lot: [id number]   NDC: [phone]

## 2020-05-27 ENCOUNTER — Other Ambulatory Visit (HOSPITAL_BASED_OUTPATIENT_CLINIC_OR_DEPARTMENT_OTHER): Payer: Self-pay

## 2020-05-27 MED ORDER — PFIZER-BIONT COVID-19 VAC-TRIS 30 MCG/0.3ML IM SUSP
INTRAMUSCULAR | 0 refills | Status: DC
  Filled 2020-05-27: qty 0.3, 1d supply, fill #0

## 2020-05-28 ENCOUNTER — Other Ambulatory Visit (HOSPITAL_BASED_OUTPATIENT_CLINIC_OR_DEPARTMENT_OTHER): Payer: Self-pay

## 2020-05-30 MED FILL — Sumatriptan Succinate Tab 100 MG: ORAL | 3 days supply | Qty: 6 | Fill #0 | Status: CN

## 2020-05-31 ENCOUNTER — Other Ambulatory Visit (HOSPITAL_BASED_OUTPATIENT_CLINIC_OR_DEPARTMENT_OTHER): Payer: Self-pay

## 2020-05-31 DIAGNOSIS — F4323 Adjustment disorder with mixed anxiety and depressed mood: Secondary | ICD-10-CM | POA: Diagnosis not present

## 2020-05-31 DIAGNOSIS — M5417 Radiculopathy, lumbosacral region: Secondary | ICD-10-CM | POA: Diagnosis not present

## 2020-05-31 MED ORDER — DICLOFENAC SODIUM 75 MG PO TBEC
DELAYED_RELEASE_TABLET | ORAL | 1 refills | Status: DC
  Filled 2020-05-31: qty 30, 15d supply, fill #0

## 2020-05-31 MED ORDER — METHYLPREDNISOLONE 4 MG PO TBPK
ORAL_TABLET | ORAL | 0 refills | Status: DC
  Filled 2020-05-31: qty 21, 6d supply, fill #0

## 2020-06-01 ENCOUNTER — Other Ambulatory Visit (HOSPITAL_BASED_OUTPATIENT_CLINIC_OR_DEPARTMENT_OTHER): Payer: Self-pay

## 2020-06-08 DIAGNOSIS — M5441 Lumbago with sciatica, right side: Secondary | ICD-10-CM | POA: Diagnosis not present

## 2020-06-08 DIAGNOSIS — G8929 Other chronic pain: Secondary | ICD-10-CM | POA: Diagnosis not present

## 2020-06-10 DIAGNOSIS — G8929 Other chronic pain: Secondary | ICD-10-CM | POA: Diagnosis not present

## 2020-06-10 DIAGNOSIS — M5441 Lumbago with sciatica, right side: Secondary | ICD-10-CM | POA: Diagnosis not present

## 2020-06-11 DIAGNOSIS — R3589 Other polyuria: Secondary | ICD-10-CM | POA: Diagnosis not present

## 2020-06-11 DIAGNOSIS — N138 Other obstructive and reflux uropathy: Secondary | ICD-10-CM | POA: Diagnosis not present

## 2020-06-11 DIAGNOSIS — N401 Enlarged prostate with lower urinary tract symptoms: Secondary | ICD-10-CM | POA: Diagnosis not present

## 2020-06-14 ENCOUNTER — Other Ambulatory Visit (HOSPITAL_BASED_OUTPATIENT_CLINIC_OR_DEPARTMENT_OTHER): Payer: Self-pay

## 2020-06-14 MED FILL — Losartan Potassium Tab 100 MG: ORAL | 90 days supply | Qty: 90 | Fill #0 | Status: AC

## 2020-06-15 DIAGNOSIS — M5441 Lumbago with sciatica, right side: Secondary | ICD-10-CM | POA: Diagnosis not present

## 2020-06-15 DIAGNOSIS — G8929 Other chronic pain: Secondary | ICD-10-CM | POA: Diagnosis not present

## 2020-06-17 DIAGNOSIS — M5441 Lumbago with sciatica, right side: Secondary | ICD-10-CM | POA: Diagnosis not present

## 2020-06-17 DIAGNOSIS — G8929 Other chronic pain: Secondary | ICD-10-CM | POA: Diagnosis not present

## 2020-06-22 DIAGNOSIS — M5441 Lumbago with sciatica, right side: Secondary | ICD-10-CM | POA: Diagnosis not present

## 2020-06-22 DIAGNOSIS — G8929 Other chronic pain: Secondary | ICD-10-CM | POA: Diagnosis not present

## 2020-06-24 DIAGNOSIS — G8929 Other chronic pain: Secondary | ICD-10-CM | POA: Diagnosis not present

## 2020-06-24 DIAGNOSIS — Q7649 Other congenital malformations of spine, not associated with scoliosis: Secondary | ICD-10-CM | POA: Diagnosis not present

## 2020-06-24 DIAGNOSIS — M47816 Spondylosis without myelopathy or radiculopathy, lumbar region: Secondary | ICD-10-CM | POA: Diagnosis not present

## 2020-06-24 DIAGNOSIS — M5126 Other intervertebral disc displacement, lumbar region: Secondary | ICD-10-CM | POA: Diagnosis not present

## 2020-06-24 DIAGNOSIS — M5441 Lumbago with sciatica, right side: Secondary | ICD-10-CM | POA: Diagnosis not present

## 2020-06-30 DIAGNOSIS — G8929 Other chronic pain: Secondary | ICD-10-CM | POA: Diagnosis not present

## 2020-06-30 DIAGNOSIS — M5441 Lumbago with sciatica, right side: Secondary | ICD-10-CM | POA: Diagnosis not present

## 2020-07-02 ENCOUNTER — Other Ambulatory Visit (HOSPITAL_BASED_OUTPATIENT_CLINIC_OR_DEPARTMENT_OTHER): Payer: Self-pay

## 2020-07-02 DIAGNOSIS — M5441 Lumbago with sciatica, right side: Secondary | ICD-10-CM | POA: Diagnosis not present

## 2020-07-02 DIAGNOSIS — G8929 Other chronic pain: Secondary | ICD-10-CM | POA: Diagnosis not present

## 2020-07-02 MED ORDER — TRAMADOL HCL 50 MG PO TABS
ORAL_TABLET | ORAL | 0 refills | Status: DC
  Filled 2020-07-02: qty 60, 15d supply, fill #0

## 2020-07-02 MED ORDER — GABAPENTIN 300 MG PO CAPS
1.0000 | ORAL_CAPSULE | Freq: Three times a day (TID) | ORAL | 11 refills | Status: DC
  Filled 2020-07-02: qty 90, 30d supply, fill #0
  Filled 2020-10-27: qty 90, 30d supply, fill #1
  Filled 2020-12-13: qty 90, 30d supply, fill #2
  Filled 2021-01-14: qty 90, 30d supply, fill #3
  Filled 2021-02-24: qty 90, 30d supply, fill #4

## 2020-07-08 ENCOUNTER — Other Ambulatory Visit (HOSPITAL_BASED_OUTPATIENT_CLINIC_OR_DEPARTMENT_OTHER): Payer: Self-pay

## 2020-07-08 DIAGNOSIS — M5134 Other intervertebral disc degeneration, thoracic region: Secondary | ICD-10-CM | POA: Diagnosis not present

## 2020-07-14 DIAGNOSIS — M5441 Lumbago with sciatica, right side: Secondary | ICD-10-CM | POA: Diagnosis not present

## 2020-07-14 DIAGNOSIS — R293 Abnormal posture: Secondary | ICD-10-CM | POA: Diagnosis not present

## 2020-07-14 DIAGNOSIS — G8929 Other chronic pain: Secondary | ICD-10-CM | POA: Diagnosis not present

## 2020-07-14 DIAGNOSIS — M79604 Pain in right leg: Secondary | ICD-10-CM | POA: Diagnosis not present

## 2020-07-16 DIAGNOSIS — K080 Exfoliation of teeth due to systemic causes: Secondary | ICD-10-CM | POA: Diagnosis not present

## 2020-07-23 ENCOUNTER — Other Ambulatory Visit (HOSPITAL_BASED_OUTPATIENT_CLINIC_OR_DEPARTMENT_OTHER): Payer: Self-pay

## 2020-07-23 DIAGNOSIS — E7800 Pure hypercholesterolemia, unspecified: Secondary | ICD-10-CM | POA: Diagnosis not present

## 2020-07-23 DIAGNOSIS — K219 Gastro-esophageal reflux disease without esophagitis: Secondary | ICD-10-CM | POA: Diagnosis not present

## 2020-07-23 DIAGNOSIS — M5417 Radiculopathy, lumbosacral region: Secondary | ICD-10-CM | POA: Diagnosis not present

## 2020-07-23 DIAGNOSIS — M5416 Radiculopathy, lumbar region: Secondary | ICD-10-CM | POA: Diagnosis not present

## 2020-07-23 DIAGNOSIS — I10 Essential (primary) hypertension: Secondary | ICD-10-CM | POA: Diagnosis not present

## 2020-07-23 MED ORDER — HYDROCODONE-ACETAMINOPHEN 5-325 MG PO TABS
ORAL_TABLET | ORAL | 0 refills | Status: DC
  Filled 2020-07-23: qty 20, 5d supply, fill #0

## 2020-07-23 MED ORDER — GABAPENTIN 300 MG PO CAPS
ORAL_CAPSULE | ORAL | 0 refills | Status: DC
  Filled 2020-07-23 – 2020-07-30 (×2): qty 60, 30d supply, fill #0

## 2020-07-23 MED ORDER — DICLOFENAC SODIUM 75 MG PO TBEC
DELAYED_RELEASE_TABLET | ORAL | 1 refills | Status: DC
  Filled 2020-07-23 – 2020-09-10 (×2): qty 30, 15d supply, fill #0
  Filled 2020-09-27: qty 30, 15d supply, fill #1

## 2020-07-23 MED ORDER — PREDNISONE 20 MG PO TABS
ORAL_TABLET | ORAL | 0 refills | Status: DC
  Filled 2020-07-23: qty 5, 5d supply, fill #0

## 2020-07-29 DIAGNOSIS — M5416 Radiculopathy, lumbar region: Secondary | ICD-10-CM | POA: Diagnosis not present

## 2020-07-30 ENCOUNTER — Other Ambulatory Visit (HOSPITAL_BASED_OUTPATIENT_CLINIC_OR_DEPARTMENT_OTHER): Payer: Self-pay

## 2020-08-13 DIAGNOSIS — M545 Low back pain, unspecified: Secondary | ICD-10-CM | POA: Diagnosis not present

## 2020-08-23 ENCOUNTER — Encounter (HOSPITAL_BASED_OUTPATIENT_CLINIC_OR_DEPARTMENT_OTHER): Payer: Self-pay | Admitting: Physical Therapy

## 2020-08-23 ENCOUNTER — Ambulatory Visit (HOSPITAL_BASED_OUTPATIENT_CLINIC_OR_DEPARTMENT_OTHER): Payer: Federal, State, Local not specified - PPO | Attending: Orthopedic Surgery | Admitting: Physical Therapy

## 2020-08-23 ENCOUNTER — Other Ambulatory Visit: Payer: Self-pay

## 2020-08-23 DIAGNOSIS — M6281 Muscle weakness (generalized): Secondary | ICD-10-CM | POA: Insufficient documentation

## 2020-08-23 DIAGNOSIS — R262 Difficulty in walking, not elsewhere classified: Secondary | ICD-10-CM

## 2020-08-23 DIAGNOSIS — M545 Low back pain, unspecified: Secondary | ICD-10-CM | POA: Insufficient documentation

## 2020-08-23 NOTE — Patient Instructions (Signed)
Access Code: [email or web address] URL: [email or web address] Date: 08/23/2020 Prepared by: Zebedee Iba  Exercises Supine Posterior Pelvic Tilt - 2 x daily - 7 x weekly - 2 sets - 10 reps - 2 hold Supine Piriformis Stretch with Foot on Ground - 2 x daily - 7 x weekly - 1 sets - 3 reps - 30 hold Prone Press Up - 2 x daily - 7 x weekly - 1 sets - 10 reps - 3 hold

## 2020-08-23 NOTE — Therapy (Signed)
[facility] [address] [place], [place], [address] Phone: [phone]   Fax:  [phone]  Physical Therapy Evaluation  Patient Details  Name: Kenneth Melendez MRN: [id number] Date of Birth: 11/04/1955 Referring Provider (PT): Venita Lick, MD   Encounter Date: 08/23/2020   PT End of Session - 08/23/20 0828     Visit Number 1    Number of Visits 17    Date for PT Re-Evaluation 11/21/20    Authorization Type BCBS    PT Start Time 0805    PT Stop Time 0850    PT Time Calculation (min) 45 min    Activity Tolerance Patient tolerated treatment well;Patient limited by pain    Behavior During Therapy [facility] for tasks assessed/performed             Past Medical History:  Diagnosis Date   Erectile dysfunction    Hemorrhoids    Hyperlipidemia    Hypertension    Migraine    followed by [facility]   Pre-diabetes    Urethral stricture     Past Surgical History:  Procedure Laterality Date   APPENDECTOMY  1970's   BUNIONECTOMY Right 2005  approx   COLONOSCOPY  04/20/2009   CYSTOSCOPY WITH URETHRAL DILATATION N/A 01/17/2016   Procedure: CYSTOSCOPY WITH URETHRAL  DILATATION;  Surgeon: [name], MD;  Location: [facility];  Service: Urology;  Laterality: N/A;   HEMORRHOIDECTOMY WITH HEMORRHOID BANDING  06/14/2010   INGUINAL HERNIA REPAIR Left 1996  approx   SHOULDER ARTHROSCOPY  01/02/2011   Procedure: ARTHROSCOPY SHOULDER;  Surgeon: [name];  Location: [facility];  Service: Orthopedics;  Laterality: Left;   SUPRAUMBILICAL HERNIA REPAIR W/ MESH  05/14/2009    There were no vitals filed for this visit.    Subjective Assessment - 08/23/20 0814     Subjective Pt states that the LBP has been going on for 2-3 months at this point. Pt has had this issue in the past and resolved with previous aquatic PT but has since returned. Pt stopped working out recently due to pain. Pt stopped running  as well. Pt has NT goes all the way into the toes. Pt states he has a herniated disc with insidious onset. Pt states he has had imagining perform a few months ago through Dr. Shon Baton. Current 4/10, Best 2/10, Worst 9/10. Pt locates pain in the R hip, hip, calf. Pt states it limits to stand, sit, walk. his ability to sleep, only 1-2 hours of sleep per night. Aggs: bending, lifting, transfers; Eases: TENS unit, jacuzzi, medication. Pt denies drop attacks. Pt denies BB changes. Pt denies dizziness. Pt denies cancer red flags. Pt used to work out at [employer] in [place] with pool. Pt does not currently have gym membership. 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 Pt is a retired Occupational hygienist.    Limitations Sitting;Standing;Walking;Lifting    How long can you sit comfortably? 5-6 hrs in a car    How long can you stand comfortably? unknown    How long can you walk comfortably? 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    Currently in Pain? Yes    Pain Score 4     Pain Location Back    Pain Orientation Right    Pain Descriptors / Indicators Aching;Tingling;Numbness  [facility] PT Assessment - 08/23/20 0805       Assessment   Medical Diagnosis M54.50 (ICD-10-CM) - Low back pain, unspecified    Referring Provider (PT) Venita Lick, MD    Prior Therapy Aquatic therapy over 10 years ago      Precautions   Precautions None      Restrictions   Weight Bearing Restrictions No      Balance Screen   Has the patient fallen in the past 6 months No    Has the patient had a decrease in activity level because of a fear of falling?  No    Is the patient reluctant to leave their home because of a fear of falling?  No      Home [employer]ist information centre manager residence    Living Arrangements Spouse/significant other    Type of Home House      Prior Function   Level of  Independence Independent      Cognition   Overall Cognitive Status Within Functional Limits for tasks assessed      Observation/Other Assessments   Other Surveys  Oswestry Disability Index    Oswestry Disability Index  20/50 40%      Sensation   Light Touch Impaired by gross assessment      Functional Tests   Functional tests Sit to Stand      Sit to Stand   Comments 5XSTS 17.1s      Posture/Postural Control   Posture/Postural Control Postural limitations    Postural Limitations Decreased lumbar lordosis;Increased thoracic kyphosis      ROM / Strength   AROM / PROM / Strength AROM;Strength;PROM      AROM   Overall AROM Comments L/S: flexion 50%, ext 80%, R rot 75%, L rot WFL, R SB 50% , L SB 50%      PROM   Overall PROM Comments bilateral hip stiffness, pain at end range flexion and IR      Strength   Overall Strength Comments R hip 4+/5 throughout, L hip 5/5 throughout      Palpation   Spinal mobility decreased extension glide with L1-5 CPA    SI assessment  no significant malalignment noted    Palpation comment TTP and hypertonicity of R  L/S and lower T/S paraspinals, QL, and hip rotators      Special Tests    Special Tests Hip Special Tests;Lumbar    Lumbar Tests FABER test;Slump Test;Straight Leg Raise    Hip Special Tests  Piriformis Test      FABER test   findings Positive      Slump test   Findings Positive      Straight Leg Raise   Findings Positive      Piriformis Test   Findings Positive      Ambulation/Gait   Ambulation/Gait Yes    Ambulation Distance (Feet) 50 Feet    Gait Pattern Decreased stance time - right;Decreased hip/knee flexion - right;Decreased hip/knee flexion - left;Decreased trunk rotation    Ambulation Surface Level                        Objective measurements completed on examination: See above findings.       [facility] Adult PT Treatment/Exercise - 08/23/20 0805       Exercises   Exercises Lumbar       Lumbar Exercises: Stretches   Pelvic Tilt Limitations 15x 2s hold    Press  Ups Limitations 10x 3s    Piriformis Stretch Limitations both knees bent, R 30s 3x                    PT Education - 08/23/20 0829     Education Details MOI, diagnosis, prognosis, anatomy, exercise progression, DOMS expectations, muscle firing, HEP, joint protection, postural changes, envelope of function, POC    Person(s) Educated Patient    Methods Explanation;Demonstration;Tactile cues;Verbal cues;Handout    Comprehension Verbalized understanding;Returned demonstration;Verbal cues required;Tactile cues required              PT Short Term Goals - 08/23/20 1332       PT SHORT TERM GOAL #1   Title Pt will become independent with HEP in order to demonstrate synthesis of PT education.    Time 2    Period Weeks    Status New      PT SHORT TERM GOAL #2   Title Pt will demonstrate at least a 12.8 improvement in Oswestry Index in order to demonstrate a clinically significant change in LBP and function.    Time 4    Period Weeks    Status New      PT SHORT TERM GOAL #3   Title Pt will demonstrate/report ability to stand/sit/sleep without pain in order to demonstrate functional improvement and tolerance to static positioning.    Time 4    Period Weeks    Status New               PT Long Term Goals - 08/23/20 1333       PT LONG TERM GOAL #1   Title Pt will become independent with final HEP in order to demonstrate synthesis of PT education.    Time 8    Period Weeks    Status New      PT LONG TERM GOAL #2   Title Pt will be able to perform 5XSTS in under 12  in order to demonstrate functional improvement above the cut off score for older adults.    Time 8    Period Weeks    Status New      PT LONG TERM GOAL #3   Title Pt will be able to demonstrate ability to run/jog without pain in order to demonstrate functional improvement and tolerance to low level plyometric loading.     Time 8    Period Weeks    Status New                    Plan - 08/23/20 0829     Clinical Impression Statement Pt is a 65 y.o. male presenting to PT eval today for CC of R sided LBP and sciatica. Pt presents with decreased L/S ROM, lumbopelvic weaknes, and localized muscle spasm. Pt s/s appear consistent with chronic LBP and potentional internal disc derangement. Pt reports MR and X-ray showing DDD as well as disc herniation. Will request access to those documents. Pt's symptoms are moderately sensitive and irritable. Pt responded well to extension directional preference and hip stretching at initial evaluation. Pt's impairments limit participation with ADL, exercise, and recreation.  Pt would benefit from continued skilled therapy in order to reach goals and maximize functional lumbopelvic strength and ROM for full return to PLOF.    Personal Factors and Comorbidities Comorbidity 1;Comorbidity 2;Profession;Fitness    Examination-Activity Limitations Transfers;Bend;Sleep;Stairs;Squat;Stand;Lift    Examination-Participation Restrictions Chief Financial OfficerYard Work;Community Activity;Occupation;Other    Stability/Clinical Decision Making Stable/Uncomplicated  Clinical Decision Making Low    Rehab Potential Good    PT Frequency 2x / week    PT Duration 8 weeks    PT Treatment/Interventions ADLs/Self Care Home Management;Aquatic Therapy;Cryotherapy;Electrical Stimulation;Iontophoresis 4mg /ml Dexamethasone;Moist Heat;Traction;Ultrasound;Gait training;Stair training;Functional mobility training;Therapeutic activities;Therapeutic exercise;Balance training;Neuromuscular re-education;Patient/family education;Orthotic Fit/Training;Manual techniques;Passive range of motion;Dry needling;Energy conservation;Taping;Vasopneumatic Device;Joint Manipulations;Spinal Manipulations    PT Next Visit Plan review HEP, standing directional preference, LTR, figure 4 stretch, STM and joint mobs, aquatic as tolerated    PT Home  Exercise Plan Access Code [email or web address]    Consulted and Agree with Plan of Care Patient             Patient will benefit from skilled therapeutic intervention in order to improve the following deficits and impairments:  Abnormal gait, Pain, Improper body mechanics, Increased muscle spasms, Postural dysfunction, Decreased range of motion, Decreased strength, Hypomobility, Impaired flexibility, Difficulty walking  Visit Diagnosis: Pain, lumbar region  Muscle weakness (generalized)  Difficulty walking     Problem List Patient Active Problem List   Diagnosis Date Noted   Travel advice encounter 12/28/2017   Migraine without aura and without status migrainosus, not intractable 10/21/2014   HYPERLIPIDEMIA 04/03/2009   HYPERTENSION 04/03/2009   04/05/2009 PT, DPT 08/23/20 1:35 PM   [facility] [address] [place], [place], [place] Phone: [phone]   Fax:  [phone]  Name: CAELAN BRANDEN MRN: [name] Date of Birth: May 09, 1955

## 2020-08-26 ENCOUNTER — Other Ambulatory Visit (HOSPITAL_BASED_OUTPATIENT_CLINIC_OR_DEPARTMENT_OTHER): Payer: Self-pay

## 2020-08-26 DIAGNOSIS — M5441 Lumbago with sciatica, right side: Secondary | ICD-10-CM | POA: Diagnosis not present

## 2020-08-26 DIAGNOSIS — M5417 Radiculopathy, lumbosacral region: Secondary | ICD-10-CM | POA: Diagnosis not present

## 2020-08-26 MED ORDER — KETOROLAC TROMETHAMINE 10 MG PO TABS
ORAL_TABLET | ORAL | 0 refills | Status: DC
  Filled 2020-08-26: qty 30, 5d supply, fill #0

## 2020-08-26 MED ORDER — PANTOPRAZOLE SODIUM 40 MG PO TBEC
DELAYED_RELEASE_TABLET | ORAL | 0 refills | Status: DC
  Filled 2020-08-26: qty 30, 30d supply, fill #0

## 2020-08-26 MED ORDER — METHYLPREDNISOLONE 4 MG PO TBPK
ORAL_TABLET | ORAL | 0 refills | Status: DC
  Filled 2020-08-26: qty 21, 6d supply, fill #0

## 2020-08-27 ENCOUNTER — Other Ambulatory Visit: Payer: Self-pay

## 2020-08-27 ENCOUNTER — Ambulatory Visit (HOSPITAL_BASED_OUTPATIENT_CLINIC_OR_DEPARTMENT_OTHER): Payer: Federal, State, Local not specified - PPO | Admitting: Physical Therapy

## 2020-08-27 ENCOUNTER — Encounter (HOSPITAL_BASED_OUTPATIENT_CLINIC_OR_DEPARTMENT_OTHER): Payer: Self-pay | Admitting: Physical Therapy

## 2020-08-27 DIAGNOSIS — M545 Low back pain, unspecified: Secondary | ICD-10-CM | POA: Diagnosis not present

## 2020-08-27 DIAGNOSIS — R262 Difficulty in walking, not elsewhere classified: Secondary | ICD-10-CM | POA: Diagnosis not present

## 2020-08-27 DIAGNOSIS — M6281 Muscle weakness (generalized): Secondary | ICD-10-CM | POA: Diagnosis not present

## 2020-08-27 NOTE — Therapy (Signed)
[facility] [address] [place], [place], [address] Phone: [phone]   Fax:  [phone]  Physical Therapy Treatment  Patient Details  Name: Kenneth Melendez MRN: [id number] Date of Birth: [birth year] Referring Provider (PT): Venita Lick, MD   Encounter Date: 08/27/2020   PT End of Session - 08/27/20 0901     Visit Number 2    Number of Visits 17    Date for PT Re-Evaluation 11/21/20    Authorization Type BCBS    PT Start Time 0815    PT Stop Time 0855    PT Time Calculation (min) 40 min    Activity Tolerance Patient tolerated treatment well;Patient limited by pain    Behavior During Therapy [facility] for tasks assessed/performed             Past Medical History:  Diagnosis Date   Erectile dysfunction    Hemorrhoids    Hyperlipidemia    Hypertension    Migraine    followed by [facility]   Pre-diabetes    Urethral stricture     Past Surgical History:  Procedure Laterality Date   APPENDECTOMY  1970's   BUNIONECTOMY Right 2005  approx   COLONOSCOPY  04/20/2009   CYSTOSCOPY WITH URETHRAL DILATATION N/A 01/17/2016   Procedure: CYSTOSCOPY WITH URETHRAL  DILATATION;  Surgeon: [name], MD;  Location: [facility];  Service: Urology;  Laterality: N/A;   HEMORRHOIDECTOMY WITH HEMORRHOID BANDING  06/14/2010   INGUINAL HERNIA REPAIR Left 1996  approx   SHOULDER ARTHROSCOPY  01/02/2011   Procedure: ARTHROSCOPY SHOULDER;  Surgeon: [name];  Location: [facility];  Service: Orthopedics;  Laterality: Left;   SUPRAUMBILICAL HERNIA REPAIR W/ MESH  05/14/2009    There were no vitals filed for this visit.   Subjective Assessment - 08/27/20 0817     Subjective Pt states he was feeling "pretty good" after the last session. However, pt states he had some extra pain yesterday after getting up quickly from the couch. He does report tingling toes and calf.    Limitations  Sitting;Standing;Walking;Lifting    How long can you sit comfortably? 5-6 hrs in a car    How long can you stand comfortably? unknown    How long can you walk comfortably? 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    Currently in Pain? Yes    Pain Score 5     Pain Location Back    Pain Orientation Right    Pain Descriptors / Indicators Aching;Numbness;Tingling                               [facility] Adult PT Treatment/Exercise - 08/27/20 0001       Ambulation/Gait   Ambulation/Gait Yes    Ambulation Distance (Feet) 50 Feet    Gait Pattern Decreased stance time - right;Decreased hip/knee flexion - right;Decreased hip/knee flexion - left;Decreased trunk rotation      Posture/Postural Control   Posture/Postural Control Postural limitations    Postural Limitations Decreased lumbar lordosis;Increased thoracic kyphosis      Exercises   Exercises Lumbar      Lumbar Exercises: Stretches   Pelvic Tilt Limitations 15x 2s hold    Press Ups Limitations 10x 3s    Piriformis Stretch Limitations both knees bent, R 30s 3x  Lumbar Exercises: Seated   Sit to Stand Limitations 3x5 GTB at knees    Other Seated Lumbar Exercises sciatic n. glide 10x 2s hold      Lumbar Exercises: Supine   Bridge Limitations bridge with GTB at knees 2x10      Lumbar Exercises: Quadruped   [place]/Old Horse Limitations 10x 3s    Other Quadruped Lumbar Exercises child's pose 5s 10x      Manual Therapy   Manual Therapy Joint mobilization;Soft tissue mobilization    Joint Mobilization L2-5 UPA grade III bilat, CPA grade III    Soft tissue mobilization R gluteals and hip rotators, progressed to elbow pressure                    PT Education - 08/27/20 0900     Education Details anatomy, exercise progression, DOMS expectations, muscle firing, HEP, joint protection, postural changes, envelope of function, acceptable levels  of pain with ADL and HEP    Person(s) Educated Patient    Methods Explanation;Demonstration;Tactile cues;Verbal cues;Handout    Comprehension Verbalized understanding;Returned demonstration;Verbal cues required;Tactile cues required              PT Short Term Goals - 08/23/20 1332       PT SHORT TERM GOAL #1   Title Pt will become independent with HEP in order to demonstrate synthesis of PT education.    Time 2    Period Weeks    Status New      PT SHORT TERM GOAL #2   Title Pt will demonstrate at least a 12.8 improvement in Oswestry Index in order to demonstrate a clinically significant change in LBP and function.    Time 4    Period Weeks    Status New      PT SHORT TERM GOAL #3   Title Pt will demonstrate/report ability to stand/sit/sleep without pain in order to demonstrate functional improvement and tolerance to static positioning.    Time 4    Period Weeks    Status New               PT Long Term Goals - 08/23/20 1333       PT LONG TERM GOAL #1   Title Pt will become independent with final HEP in order to demonstrate synthesis of PT education.    Time 8    Period Weeks    Status New      PT LONG TERM GOAL #2   Title Pt will be able to perform 5XSTS in under 12  in order to demonstrate functional improvement above the cut off score for older adults.    Time 8    Period Weeks    Status New      PT LONG TERM GOAL #3   Title Pt will be able to demonstrate ability to run/jog without pain in order to demonstrate functional improvement and tolerance to low level plyometric loading.    Time 8    Period Weeks    Status New                   Plan - 08/27/20 1610     Clinical Impression Statement Pt presented with R hip and L/S mm hypertonicity at beginning of today's session. Pt's NT were not relieved with STM and joint mobilzations but pt did report decrease in pain following. Pt was then able to progress direction preference and LE strengthening  exercise without increased pain or  discomfort. Pt responded well to extension directional preference in standing as well as able to tolerate gentle flexion in child's pose. Pt's next session to be aquatic in order to progress funcitonal strengthening. Pt would benefit from continued skilled therapy in order to reach goals and maximize functional lumbopelvic strength and ROM for full return to PLOF.    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;Occupation;Other    Stability/Clinical Decision Making Stable/Uncomplicated    Rehab Potential Good    PT Frequency 2x / week    PT Duration 8 weeks    PT Treatment/Interventions ADLs/Self Care Home Management;Aquatic Therapy;Cryotherapy;Electrical Stimulation;Iontophoresis 4mg /ml Dexamethasone;Moist Heat;Traction;Ultrasound;Gait training;Stair training;Functional mobility training;Therapeutic activities;Therapeutic exercise;Balance training;Neuromuscular re-education;Patient/family education;Orthotic Fit/Training;Manual techniques;Passive range of motion;Dry needling;Energy conservation;Taping;Vasopneumatic Device;Joint Manipulations;Spinal Manipulations    PT Next Visit Plan review HEP, LTR, figure 4 stretch, STM and joint mobs, review nerve glide, aquatic as tolerated    PT Home Exercise Plan Access Code [email or web address]    Consulted and Agree with Plan of Care Patient             Patient will benefit from skilled therapeutic intervention in order to improve the following deficits and impairments:  Abnormal gait, Pain, Improper body mechanics, Increased muscle spasms, Postural dysfunction, Decreased range of motion, Decreased strength, Hypomobility, Impaired flexibility, Difficulty walking  Visit Diagnosis: Pain, lumbar region  Muscle weakness (generalized)  Difficulty  walking     Problem List Patient Active Problem List   Diagnosis Date Noted   Travel advice encounter 12/28/2017   Migraine without aura and without status migrainosus, not intractable 10/21/2014   HYPERLIPIDEMIA 04/03/2009   HYPERTENSION 04/03/2009    04/05/2009 PT, DPT 08/27/20 9:10 AM   [facility] [address] [place], [place], [place] Phone: [phone]   Fax:  [phone]  Name: Kenneth Melendez MRN: [name] Date of Birth: 09-11-1955

## 2020-08-27 NOTE — Patient Instructions (Signed)
Access Code: [email or web address] URL: [email or web address] Date: 08/27/2020 Prepared by: Zebedee Iba  Exercises Supine Posterior Pelvic Tilt - 2 x daily - 7 x weekly - 2 sets - 10 reps - 2 hold Supine Piriformis Stretch with Foot on Ground - 2 x daily - 7 x weekly - 1 sets - 3 reps - 30 hold Prone Press Up - 2 x daily - 7 x weekly - 1 sets - 10 reps - 3 hold Supine Bridge with Resistance Band - 2 x daily - 7 x weekly - 2 sets - 10 reps

## 2020-08-30 ENCOUNTER — Other Ambulatory Visit: Payer: Self-pay

## 2020-08-30 ENCOUNTER — Ambulatory Visit (HOSPITAL_BASED_OUTPATIENT_CLINIC_OR_DEPARTMENT_OTHER): Payer: Federal, State, Local not specified - PPO | Admitting: Physical Therapy

## 2020-08-30 DIAGNOSIS — M545 Low back pain, unspecified: Secondary | ICD-10-CM

## 2020-08-30 DIAGNOSIS — M6281 Muscle weakness (generalized): Secondary | ICD-10-CM

## 2020-08-30 DIAGNOSIS — R262 Difficulty in walking, not elsewhere classified: Secondary | ICD-10-CM

## 2020-08-30 NOTE — Therapy (Signed)
[facility] [address] [place], [place], [address] Phone: [phone]   Fax:  [phone]  Physical Therapy Treatment  Patient Details  Name: Kenneth Melendez MRN: [id number] Date of Birth: [birth year] Referring Provider (PT): Venita Lick, MD   Encounter Date: 08/30/2020   PT End of Session - 08/30/20 1044     Visit Number 3    Number of Visits 17    Date for PT Re-Evaluation 11/21/20    Authorization Type BCBS    PT Start Time 0805    PT Stop Time 0845    PT Time Calculation (min) 40 min    Activity Tolerance Patient tolerated treatment well;Patient limited by pain    Behavior During Therapy [facility] for tasks assessed/performed             Past Medical History:  Diagnosis Date   Erectile dysfunction    Hemorrhoids    Hyperlipidemia    Hypertension    Migraine    followed by [facility]   Pre-diabetes    Urethral stricture     Past Surgical History:  Procedure Laterality Date   APPENDECTOMY  1970's   BUNIONECTOMY Right 2005  approx   COLONOSCOPY  04/20/2009   CYSTOSCOPY WITH URETHRAL DILATATION N/A 01/17/2016   Procedure: CYSTOSCOPY WITH URETHRAL  DILATATION;  Surgeon: [name], MD;  [facility];  Service: Urology;  Laterality: N/A;   HEMORRHOIDECTOMY WITH HEMORRHOID BANDING  06/14/2010   INGUINAL HERNIA REPAIR Left 1996  approx   SHOULDER ARTHROSCOPY  01/02/2011   Procedure: ARTHROSCOPY SHOULDER;  Surgeon: [name];  Location[facility];  Service: Orthopedics;  Laterality: Left;   SUPRAUMBILICAL HERNIA REPAIR W/ MESH  05/14/2009    There were no vitals filed for this visit.   Subjective Assessment - 08/30/20 0802     Subjective Pt states the pain come down after last session. Pt states that after last night he had some more pain but was able to sleep better. Pt states NTis still down to the toes. He aggravated it yesterday while working in the yard.     Limitations Sitting;Standing;Walking;Lifting    How long can you sit comfortably? 5-6 hrs in a car    How long can you stand comfortably? unknown    How long can you walk comfortably? 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    Currently in Pain? Yes    Pain Score 4     Pain Location Back    Pain Orientation Right    Pain Descriptors / Indicators Aching;Numbness;Tingling                                       PT Education - 08/30/20 1044     Education Details joint protection, postural changes, envelope of function, acceptable levels of pain with ADL and HEP    Person(s) Educated Patient    Methods Explanation;Demonstration;Tactile cues;Verbal cues    Comprehension Tactile cues required;Verbal cues required;Returned demonstration;Verbalized understanding              PT Short Term Goals - 08/23/20 1332       PT SHORT TERM GOAL #1   Title Pt will become independent with HEP in order to demonstrate synthesis of PT education.    Time 2  Period Weeks    Status New      PT SHORT TERM GOAL #2   Title Pt will demonstrate at least a 12.8 improvement in Oswestry Index in order to demonstrate a clinically significant change in LBP and function.    Time 4    Period Weeks    Status New      PT SHORT TERM GOAL #3   Title Pt will demonstrate/report ability to stand/sit/sleep without pain in order to demonstrate functional improvement and tolerance to static positioning.    Time 4    Period Weeks    Status New               PT Long Term Goals - 08/23/20 1333       PT LONG TERM GOAL #1   Title Pt will become independent with final HEP in order to demonstrate synthesis of PT education.    Time 8    Period Weeks    Status New      PT LONG TERM GOAL #2   Title Pt will be able to perform 5XSTS in under 12  in order to demonstrate functional improvement above the cut off score for  older adults.    Time 8    Period Weeks    Status New      PT LONG TERM GOAL #3   Title Pt will be able to demonstrate ability to run/jog without pain in order to demonstrate functional improvement and tolerance to low level plyometric loading.    Time 8    Period Weeks    Status New              Pt seen for aquatic therapy today.  Treatment took place in water 3.25-4 ft in depth at the [facility] pool. Temp of water was 91.  Pt entered/exited the pool via stairs (alternating) independently with bilat rail.  Warm up: sidestepping, retro walking, QL stretch at edge of pool, squat sidesteping 4x each;  Exercises: STS at bench 2x10, seated figure 4 stretch 30s 3x, standing hip ABD (fast and slow) 2x10 ; large yellow noodle rotation in half squat 2x10, board press and row in half squat 2x20; standing hip extension 2x10 3s hold;     Pt requires buoyancy for support and to offload joints with strengthening exercises. Viscosity of the water is needed for resistance of strengthening; water current perturbations provides challenge to standing balance unsupported, requiring increased core activation.     Plan - 08/30/20 0842     Clinical Impression Statement Pt demonstrated good tolerance for aquatic exercise today without increased in pain. Pt had decrease of pain to 2/10 following session. Pt required increased VC and TC for neutral spine angle and reduced anterior pelvic tilt. Pt had difficulty with maintaining trunk and hip position with chest deep water exercise due to increased buoyancy and turbulance. Pt is improving with lumbar mobility and tolerance for exercise though rquired continued reinforcement for reducing/pacing activity at home to prevent more symptom exacerbation. Pt would benefit from continued skilled therapy in order to reach goals and maximize functional lumbopelvic strength and ROM for full return to PLOF.    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;Occupation;Other    Stability/Clinical Decision Making Stable/Uncomplicated    Rehab Potential Good    PT Frequency 2x / week    PT Duration 8 weeks    PT  Treatment/Interventions ADLs/Self Care Home Management;Aquatic Therapy;Cryotherapy;Electrical Stimulation;Iontophoresis 4mg /ml Dexamethasone;Moist Heat;Traction;Ultrasound;Gait training;Stair training;Functional mobility training;Therapeutic activities;Therapeutic exercise;Balance training;Neuromuscular re-education;Patient/family education;Orthotic Fit/Training;Manual techniques;Passive range of motion;Dry needling;Energy conservation;Taping;Vasopneumatic Device;Joint Manipulations;Spinal Manipulations    PT Next Visit Plan review HEP, LTR, figure 4 stretch, STM and joint mobs, review nerve glide, aquatic as tolerated    PT Home Exercise Plan Access Code [email or web address]    Consulted and Agree with Plan of Care Patient             Patient will benefit from skilled therapeutic intervention in order to improve the following deficits and impairments:  Abnormal gait, Pain, Improper body mechanics, Increased muscle spasms, Postural dysfunction, Decreased range of motion, Decreased strength, Hypomobility, Impaired flexibility, Difficulty walking  Visit Diagnosis: Pain, lumbar region  Muscle weakness (generalized)  Difficulty walking     Problem List Patient Active Problem List   Diagnosis Date Noted   Travel advice encounter 12/28/2017   Migraine without aura and without status migrainosus, not intractable 10/21/2014   HYPERLIPIDEMIA 04/03/2009   HYPERTENSION 04/03/2009    04/05/2009 PT, DPT 08/30/20 10:46 AM   [facility] [address] [place], [place], [place] Phone: [phone]   Fax:   [phone]  Name: Kenneth Melendez MRN: [name] Date of Birth: Aug 14, 1955

## 2020-09-02 ENCOUNTER — Other Ambulatory Visit (HOSPITAL_BASED_OUTPATIENT_CLINIC_OR_DEPARTMENT_OTHER): Payer: Self-pay

## 2020-09-03 ENCOUNTER — Other Ambulatory Visit (HOSPITAL_BASED_OUTPATIENT_CLINIC_OR_DEPARTMENT_OTHER): Payer: Self-pay

## 2020-09-06 ENCOUNTER — Ambulatory Visit (HOSPITAL_BASED_OUTPATIENT_CLINIC_OR_DEPARTMENT_OTHER): Payer: Federal, State, Local not specified - PPO | Admitting: Physical Therapy

## 2020-09-10 ENCOUNTER — Other Ambulatory Visit (HOSPITAL_BASED_OUTPATIENT_CLINIC_OR_DEPARTMENT_OTHER): Payer: Self-pay

## 2020-09-10 ENCOUNTER — Ambulatory Visit (HOSPITAL_BASED_OUTPATIENT_CLINIC_OR_DEPARTMENT_OTHER): Payer: Federal, State, Local not specified - PPO | Attending: Orthopedic Surgery | Admitting: Physical Therapy

## 2020-09-10 ENCOUNTER — Other Ambulatory Visit: Payer: Self-pay

## 2020-09-10 ENCOUNTER — Encounter (HOSPITAL_BASED_OUTPATIENT_CLINIC_OR_DEPARTMENT_OTHER): Payer: Self-pay | Admitting: Physical Therapy

## 2020-09-10 DIAGNOSIS — M6281 Muscle weakness (generalized): Secondary | ICD-10-CM | POA: Diagnosis not present

## 2020-09-10 DIAGNOSIS — M545 Low back pain, unspecified: Secondary | ICD-10-CM | POA: Insufficient documentation

## 2020-09-10 DIAGNOSIS — R262 Difficulty in walking, not elsewhere classified: Secondary | ICD-10-CM | POA: Diagnosis not present

## 2020-09-10 NOTE — Therapy (Signed)
[facility] [address] [place], [place], [address] Phone: [phone]   Fax:  [phone]  Physical Therapy Treatment  Patient Details  Name: Kenneth Melendez MRN: [id number] Date of Birth: Feb 24, 1955 Referring Provider (PT): Venita Lick, MD   Encounter Date: 09/10/2020   PT End of Session - 09/10/20 0829     Visit Number 4    Number of Visits 17    Date for PT Re-Evaluation 11/21/20    Authorization Type BCBS    PT Start Time 0801    PT Stop Time 0841    PT Time Calculation (min) 40 min    Activity Tolerance Patient tolerated treatment well;Patient limited by pain    Behavior During Therapy [facility] for tasks assessed/performed             Past Medical History:  Diagnosis Date   Erectile dysfunction    Hemorrhoids    Hyperlipidemia    Hypertension    Migraine    followed by [facility]   Pre-diabetes    Urethral stricture     Past Surgical History:  Procedure Laterality Date   APPENDECTOMY  1970's   BUNIONECTOMY Right 2005  approx   COLONOSCOPY  04/20/2009   CYSTOSCOPY WITH URETHRAL DILATATION N/A 01/17/2016   Procedure: CYSTOSCOPY WITH URETHRAL  DILATATION;  Surgeon: [name], MD;  Location: [facility];  Service: Urology;  Laterality: N/A;   HEMORRHOIDECTOMY WITH HEMORRHOID BANDING  06/14/2010   INGUINAL HERNIA REPAIR Left 1996  approx   SHOULDER ARTHROSCOPY  01/02/2011   Procedure: ARTHROSCOPY SHOULDER;  Surgeon: [name];  [facility];  Service: Orthopedics;  Laterality: Left;   SUPRAUMBILICAL HERNIA REPAIR W/ MESH  05/14/2009    There were no vitals filed for this visit.   Subjective Assessment - 09/10/20 0803     Subjective Pt states he has severely increased pain today in his R L/S and hip due to having to change a flat tire while driving to [place]. He has also been sitting in the car for hours.    Limitations Sitting;Standing;Walking;Lifting    How  long can you sit comfortably? 5-6 hrs in a car    How long can you stand comfortably? unknown    How long can you walk comfortably? 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    Currently in Pain? Yes    Pain Score 8     Pain Location Back    Pain Orientation Right    Pain Descriptors / Indicators Aching;Numbness;Tingling                               [facility] Adult PT Treatment/Exercise - 09/10/20 0001       Posture/Postural Control   Posture/Postural Control Postural limitations    Postural Limitations Decreased lumbar lordosis;Increased thoracic kyphosis      Exercises   Exercises Lumbar      Lumbar Exercises: Stretches   Pelvic Tilt Limitations --    Press Ups Limitations 10x 3s    Piriformis Stretch Limitations both knees bent, R 30s 3x    Other Lumbar Stretch Exercise standing QL stretch 30s 3x in doorway      Lumbar Exercises: Seated   Other Seated Lumbar Exercises sciatic n glide 15x (stopped at 3x due to signficant increase)    Other  Seated Lumbar Exercises McGill curl up 10x               Lumbar Exercises: Quadruped   [place] Horse Limitations --    Other Quadruped Lumbar Exercises child's pose 5s 10x      Manual Therapy   Manual Therapy Joint mobilization;Soft tissue mobilization    Joint Mobilization L2-5 UPA grade III bilat, CPA grade III    Soft tissue mobilization R gluteals and hip rotators, progressed to elbow pressure                    PT Education - 09/10/20 0829     Education Details joint protection, postural changes, envelope of function, acceptable levels of pain with ADL and HEP, pacing of activity,    Person(s) Educated Patient    Methods Explanation;Demonstration;Tactile cues;Verbal cues    Comprehension Verbalized understanding;Returned demonstration;Verbal cues required;Tactile cues required              PT Short Term Goals - 08/23/20 1332        PT SHORT TERM GOAL #1   Title Pt will become independent with HEP in order to demonstrate synthesis of PT education.    Time 2    Period Weeks    Status New      PT SHORT TERM GOAL #2   Title Pt will demonstrate at least a 12.8 improvement in Oswestry Index in order to demonstrate a clinically significant change in LBP and function.    Time 4    Period Weeks    Status New      PT SHORT TERM GOAL #3   Title Pt will demonstrate/report ability to stand/sit/sleep without pain in order to demonstrate functional improvement and tolerance to static positioning.    Time 4    Period Weeks    Status New               PT Long Term Goals - 08/23/20 1333       PT LONG TERM GOAL #1   Title Pt will become independent with final HEP in order to demonstrate synthesis of PT education.    Time 8    Period Weeks    Status New      PT LONG TERM GOAL #2   Title Pt will be able to perform 5XSTS in under 12  in order to demonstrate functional improvement above the cut off score for older adults.    Time 8    Period Weeks    Status New      PT LONG TERM GOAL #3   Title Pt will be able to demonstrate ability to run/jog without pain in order to demonstrate functional improvement and tolerance to low level plyometric loading.    Time 8    Period Weeks    Status New                   Plan - 09/10/20 0830     Clinical Impression Statement Pt presents to session today with signifcantly increased pain at beginning of today's session. Pt has moderate relief of pain after manual therapy and mobility exercise. Pt responded favorably to seated pelvic tilt and pt given edu about self pain management during driving and travel. Pt HEP not updated at this time due to increased pain. Possibly add in more resistance in CKC at next session if able. Pt would benefit from continued skilled therapy in order to reach goals and maximize functional  lumbopelvic strength and ROM for full return to  PLOF.   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;Occupation;Other    Stability/Clinical Decision Making Stable/Uncomplicated    Rehab Potential Good    PT Frequency 2x / week    PT Duration 8 weeks    PT Treatment/Interventions ADLs/Self Care Home Management;Aquatic Therapy;Cryotherapy;Electrical Stimulation;Iontophoresis 4mg /ml Dexamethasone;Moist Heat;Traction;Ultrasound;Gait training;Stair training;Functional mobility training;Therapeutic activities;Therapeutic exercise;Balance training;Neuromuscular re-education;Patient/family education;Orthotic Fit/Training;Manual techniques;Passive range of motion;Dry needling;Energy conservation;Taping;Vasopneumatic Device;Joint Manipulations;Spinal Manipulations    PT Next Visit Plan review HEP, LTR, figure 4 stretch, STM and joint mobs, review nerve glide, aquatic as tolerated    PT Home Exercise Plan Access Code [email or web address]    Consulted and Agree with Plan of Care Patient             Patient will benefit from skilled therapeutic intervention in order to improve the following deficits and impairments:  Abnormal gait, Pain, Improper body mechanics, Increased muscle spasms, Postural dysfunction, Decreased range of motion, Decreased strength, Hypomobility, Impaired flexibility, Difficulty walking  Visit Diagnosis: Pain, lumbar region  Muscle weakness (generalized)  Difficulty walking     Problem List Patient Active Problem List   Diagnosis Date Noted   Travel advice encounter 12/28/2017   Migraine without aura and without status migrainosus, not intractable 10/21/2014   HYPERLIPIDEMIA 04/03/2009   HYPERTENSION 04/03/2009    04/05/2009 PT, DPT 09/10/20 8:45 AM  [facility] [address] [place], [place], [place] Phone: [phone]   Fax:  [phone]  Name: KHYLIN GUTRIDGE MRN: [name] Date of Birth: [birth year]

## 2020-09-13 ENCOUNTER — Ambulatory Visit (HOSPITAL_BASED_OUTPATIENT_CLINIC_OR_DEPARTMENT_OTHER): Payer: Federal, State, Local not specified - PPO | Admitting: Physical Therapy

## 2020-09-13 ENCOUNTER — Encounter (HOSPITAL_BASED_OUTPATIENT_CLINIC_OR_DEPARTMENT_OTHER): Payer: Self-pay | Admitting: Physical Therapy

## 2020-09-13 ENCOUNTER — Other Ambulatory Visit: Payer: Self-pay

## 2020-09-13 DIAGNOSIS — R262 Difficulty in walking, not elsewhere classified: Secondary | ICD-10-CM | POA: Diagnosis not present

## 2020-09-13 DIAGNOSIS — M545 Low back pain, unspecified: Secondary | ICD-10-CM | POA: Diagnosis not present

## 2020-09-13 DIAGNOSIS — M6281 Muscle weakness (generalized): Secondary | ICD-10-CM | POA: Diagnosis not present

## 2020-09-13 NOTE — Therapy (Signed)
[facility] [address] [place], [place], [address] Phone: [phone]   Fax:  [phone]  Physical Therapy Treatment  Patient Details  Name: Kenneth Melendez MRN: [id number] Date of Birth: 06-Feb-1956 Referring Provider (PT): Venita Lick, MD   Encounter Date: 09/13/2020   PT End of Session - 09/13/20 0802     Visit Number 5    Number of Visits 17    Date for PT Re-Evaluation 11/21/20    Authorization Type BCBS    PT Start Time 0802    PT Stop Time 0840    PT Time Calculation (min) 38 min    Activity Tolerance Patient tolerated treatment well;Patient limited by pain    Behavior During Therapy [facility] for tasks assessed/performed             Past Medical History:  Diagnosis Date   Erectile dysfunction    Hemorrhoids    Hyperlipidemia    Hypertension    Migraine    followed by [facility]   Pre-diabetes    Urethral stricture     Past Surgical History:  Procedure Laterality Date   APPENDECTOMY  1970's   BUNIONECTOMY Right 2005  approx   COLONOSCOPY  04/20/2009   CYSTOSCOPY WITH URETHRAL DILATATION N/A 01/17/2016   Procedure: CYSTOSCOPY WITH URETHRAL  DILATATION;  Surgeon: [name], MD;  Location: [facility];  Service: Urology;  Laterality: N/A;   HEMORRHOIDECTOMY WITH HEMORRHOID BANDING  06/14/2010   INGUINAL HERNIA REPAIR Left 1996  approx   SHOULDER ARTHROSCOPY  01/02/2011   Procedure: ARTHROSCOPY SHOULDER;  Surgeon: [name];  [facility];  Service: Orthopedics;  Laterality: Left;   SUPRAUMBILICAL HERNIA REPAIR W/ MESH  05/14/2009    There were no vitals filed for this visit.   Subjective Assessment - 09/13/20 0802     Subjective Pt states the pain is about the same since last time. He states he woke up and was almost unable to walk due to pain. Pt states the NT is not there just.    Limitations Sitting;Standing;Walking;Lifting    How long can you sit  comfortably? 5-6 hrs in a car    How long can you stand comfortably? unknown    How long can you walk comfortably? 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    Currently in Pain? Yes    Pain Score 7     Pain Location Hip    Pain Orientation Right    Pain Descriptors / Indicators Aching;Sore;Tingling                  Pt seen for aquatic therapy today.  Treatment took place in water 3.25-4 ft in depth at the [facility] pool. Temp of water was 91.  Pt entered/exited the pool via stairs (alternating) independently with bilat rail.   Warm up: sidestepping 6x , retro walking 6x, QL stretch at edge of pool 30s 3x R and fwd only, standing sciatic n. 20x glide, squat sidesteping 4; unable to tolerate seated piriformis stretch Exercises: half squats 3x10, standing hip ABD 3x10 ; standing hip extension 2x10 3s hold; floatation/noodle floating traction and cycling in deeper end,       Pt requires buoyancy for support and to offload joints with strengthening exercises. Viscosity of the water is needed for resistance of strengthening; water current perturbations provides challenge to standing balance unsupported,  requiring increased core activation.                     PT Education - 09/13/20 1307     Education Details joint protection, postural changes, envelope of function, acceptable levels of pain with ADL and HEP, aquatic therapy recovery    Person(s) Educated Patient    Methods Explanation;Demonstration;Tactile cues;Verbal cues    Comprehension Verbalized understanding;Returned demonstration;Verbal cues required;Tactile cues required              PT Short Term Goals - 08/23/20 1332       PT SHORT TERM GOAL #1   Title Pt will become independent with HEP in order to demonstrate synthesis of PT education.    Time 2    Period Weeks    Status New      PT SHORT TERM GOAL #2   Title Pt will  demonstrate at least a 12.8 improvement in Oswestry Index in order to demonstrate a clinically significant change in LBP and function.    Time 4    Period Weeks    Status New      PT SHORT TERM GOAL #3   Title Pt will demonstrate/report ability to stand/sit/sleep without pain in order to demonstrate functional improvement and tolerance to static positioning.    Time 4    Period Weeks    Status New               PT Long Term Goals - 08/23/20 1333       PT LONG TERM GOAL #1   Title Pt will become independent with final HEP in order to demonstrate synthesis of PT education.    Time 8    Period Weeks    Status New      PT LONG TERM GOAL #2   Title Pt will be able to perform 5XSTS in under 12  in order to demonstrate functional improvement above the cut off score for older adults.    Time 8    Period Weeks    Status New      PT LONG TERM GOAL #3   Title Pt will be able to demonstrate ability to run/jog without pain in order to demonstrate functional improvement and tolerance to low level plyometric loading.    Time 8    Period Weeks    Status New                   Plan - 09/13/20 1307     Clinical Impression Statement Pt was able to tolerate entire aquatic therapy session with increased pain but did have R hip irritation with seated exercise and stretching, so session modified to standing stretching and mobiliting exercise. Pt was able to tolerate direct R hip loading and CKC exercise without increased pain. Pt was able to reduce pain and NT into the R LE by end of session through pt report. Pt had improved stance time and ability to perform SLS by end of session. Pt R hip still irritable to direction pressure onto gluteals and hip rotators, suggesting continued soft tissue irritation. Lumbar movements do not peripheralize pain. Pt would benefit from continued skilled therapy in order to reach goals and maximize functional lumbopelvic strength and ROM for prevention of  further functional decline.    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;Occupation;Other    Stability/Clinical Decision Making Stable/Uncomplicated  Rehab Potential Good    PT Frequency 2x / week    PT Duration 8 weeks    PT Treatment/Interventions ADLs/Self Care Home Management;Aquatic Therapy;Cryotherapy;Electrical Stimulation;Iontophoresis 4mg /ml Dexamethasone;Moist Heat;Traction;Ultrasound;Gait training;Stair training;Functional mobility training;Therapeutic activities;Therapeutic exercise;Balance training;Neuromuscular re-education;Patient/family education;Orthotic Fit/Training;Manual techniques;Passive range of motion;Dry needling;Energy conservation;Taping;Vasopneumatic Device;Joint Manipulations;Spinal Manipulations    PT Next Visit Plan review HEP, LTR, figure 4 stretch, STM and joint mobs, review nerve glide, aquatic as tolerated    PT Home Exercise Plan Access Code [email or web address]    Consulted and Agree with Plan of Care Patient             Patient will benefit from skilled therapeutic intervention in order to improve the following deficits and impairments:  Abnormal gait, Pain, Improper body mechanics, Increased muscle spasms, Postural dysfunction, Decreased range of motion, Decreased strength, Hypomobility, Impaired flexibility, Difficulty walking  Visit Diagnosis: Pain, lumbar region  Muscle weakness (generalized)  Difficulty walking     Problem List Patient Active Problem List   Diagnosis Date Noted   Travel advice encounter 12/28/2017   Migraine without aura and without status migrainosus, not intractable 10/21/2014   HYPERLIPIDEMIA 04/03/2009   HYPERTENSION 04/03/2009    04/05/2009 PT, DPT 09/13/20 1:15 PM   [facility] [address] [place], [place], [place] Phone: [phone]   Fax:  [phone]  Name: Kenneth Melendez MRN: [name] Date of Birth: [birth year]

## 2020-09-15 ENCOUNTER — Other Ambulatory Visit (HOSPITAL_BASED_OUTPATIENT_CLINIC_OR_DEPARTMENT_OTHER): Payer: Self-pay

## 2020-09-15 MED ORDER — CYCLOBENZAPRINE HCL 10 MG PO TABS
ORAL_TABLET | ORAL | 0 refills | Status: DC
  Filled 2020-09-15: qty 15, 5d supply, fill #0

## 2020-09-16 ENCOUNTER — Other Ambulatory Visit (HOSPITAL_BASED_OUTPATIENT_CLINIC_OR_DEPARTMENT_OTHER): Payer: Self-pay

## 2020-09-16 DIAGNOSIS — M5417 Radiculopathy, lumbosacral region: Secondary | ICD-10-CM | POA: Diagnosis not present

## 2020-09-17 ENCOUNTER — Other Ambulatory Visit: Payer: Self-pay

## 2020-09-17 ENCOUNTER — Encounter (HOSPITAL_BASED_OUTPATIENT_CLINIC_OR_DEPARTMENT_OTHER): Payer: Self-pay | Admitting: Physical Therapy

## 2020-09-17 ENCOUNTER — Ambulatory Visit (HOSPITAL_BASED_OUTPATIENT_CLINIC_OR_DEPARTMENT_OTHER): Payer: Federal, State, Local not specified - PPO | Admitting: Physical Therapy

## 2020-09-17 DIAGNOSIS — M6281 Muscle weakness (generalized): Secondary | ICD-10-CM

## 2020-09-17 DIAGNOSIS — M545 Low back pain, unspecified: Secondary | ICD-10-CM

## 2020-09-17 DIAGNOSIS — R262 Difficulty in walking, not elsewhere classified: Secondary | ICD-10-CM

## 2020-09-17 NOTE — Therapy (Signed)
[facility] [address] [place], [place], [address] Phone: [phone]   Fax:  [phone]  Physical Therapy Treatment  Patient Details  Name: Kenneth Melendez MRN: [id number] Date of Birth: Oct 18, 1955 Referring Provider (PT): Venita Lick, MD   Encounter Date: 09/17/2020   PT End of Session - 09/17/20 0817     Visit Number 6    Number of Visits 17    Date for PT Re-Evaluation 11/21/20    Authorization Type BCBS    PT Start Time 0810    PT Stop Time 0845    PT Time Calculation (min) 35 min    Activity Tolerance Patient tolerated treatment well;Patient limited by pain    Behavior During Therapy [facility] for tasks assessed/performed             Past Medical History:  Diagnosis Date   Erectile dysfunction    Hemorrhoids    Hyperlipidemia    Hypertension    Migraine    followed by [facility]   Pre-diabetes    Urethral stricture     Past Surgical History:  Procedure Laterality Date   APPENDECTOMY  1970's   BUNIONECTOMY Right 2005  approx   COLONOSCOPY  04/20/2009   CYSTOSCOPY WITH URETHRAL DILATATION N/A 01/17/2016   Procedure: CYSTOSCOPY WITH URETHRAL  DILATATION;  Surgeon: [name], MD;  Location: [facility];  Service: Urology;  Laterality: N/A;   HEMORRHOIDECTOMY WITH HEMORRHOID BANDING  06/14/2010   INGUINAL HERNIA REPAIR Left 1996  approx   SHOULDER ARTHROSCOPY  01/02/2011   Procedure: ARTHROSCOPY SHOULDER;  Surgeon: [name];  Location: [facility];  Service: Orthopedics;  Laterality: Left;   SUPRAUMBILICAL HERNIA REPAIR W/ MESH  05/14/2009    There were no vitals filed for this visit.   Subjective Assessment - 09/17/20 0815     Subjective Pt states the pain is about the same. He notes more calf pain today. Pt states he has been doing less around the home due to his pain.    Limitations Sitting;Standing;Walking;Lifting    How long can you sit comfortably? 5-6 hrs in a  car    How long can you stand comfortably? unknown    How long can you walk comfortably? 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    Currently in Pain? Yes    Pain Score 6     Pain Location Leg    Pain Orientation Right    Pain Descriptors / Indicators Aching;Sore;Tingling                 Pt seen for aquatic therapy today.  Treatment took place in water 3.25-4 ft in depth at the [facility] pool. Temp of water was 94.  Pt entered/exited the pool via stairs (alternating) independently with single rail.   Warm up: sidestepping 6x , retro walking 6x, QL stretch at edge of pool 30s 3x R and fwd only, standing sciatic n. 20x glide, standing fig4 stretch 30s 3x, standing pool noodle rotation 20x  Exercises: half squats 3x10, standing hip ABD 3x10 ; standing hip extension 2x10 3s hold; floatation/noodle floating traction and cycling in deeper end 20x3, floating noodle hip ABD and ADD 20x3      Pt requires buoyancy for support and to offload joints with strengthening exercises. Viscosity of the water is needed for resistance of strengthening; water current perturbations provides challenge to  standing balance unsupported, requiring increased core activation.                          PT Short Term Goals - 08/23/20 1332       PT SHORT TERM GOAL #1   Title Pt will become independent with HEP in order to demonstrate synthesis of PT education.    Time 2    Period Weeks    Status New      PT SHORT TERM GOAL #2   Title Pt will demonstrate at least a 12.8 improvement in Oswestry Index in order to demonstrate a clinically significant change in LBP and function.    Time 4    Period Weeks    Status New      PT SHORT TERM GOAL #3   Title Pt will demonstrate/report ability to stand/sit/sleep without pain in order to demonstrate functional improvement and tolerance to static positioning.    Time 4     Period Weeks    Status New               PT Long Term Goals - 08/23/20 1333       PT LONG TERM GOAL #1   Title Pt will become independent with final HEP in order to demonstrate synthesis of PT education.    Time 8    Period Weeks    Status New      PT LONG TERM GOAL #2   Title Pt will be able to perform 5XSTS in under 12  in order to demonstrate functional improvement above the cut off score for older adults.    Time 8    Period Weeks    Status New      PT LONG TERM GOAL #3   Title Pt will be able to demonstrate ability to run/jog without pain in order to demonstrate functional improvement and tolerance to low level plyometric loading.    Time 8    Period Weeks    Status New                   Plan - 09/17/20 0820     Clinical Impression Statement Pt was able to tolerate entire aquatic therapy session with increased pain but did have R hip irritation with seated exercise and stretching, so session modified to standing stretching and mobiliting exercise. Pt was able to tolerate direct R hip loading and CKC exercise without increased pain. Pt was able to reduce pain and NT into the R LE by end of session through pt report. Pt had improved stance time and ability to perform SLS by end of session. Pt R hip still irritable to direction pressure onto gluteals and hip rotators. Pt would benefit from continued skilled therapy in order to reach goals and maximize functional lumbopelvic strength and ROM for prevention of further functional decline.    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;Occupation;Other    Stability/Clinical Decision Making Stable/Uncomplicated    Rehab Potential Good    PT Frequency 2x / week    PT Duration 8 weeks    PT Treatment/Interventions ADLs/Self Care Home  Management;Aquatic Therapy;Cryotherapy;Electrical Stimulation;Iontophoresis 4mg /ml Dexamethasone;Moist Heat;Traction;Ultrasound;Gait training;Stair training;Functional mobility training;Therapeutic activities;Therapeutic exercise;Balance training;Neuromuscular re-education;Patient/family education;Orthotic Fit/Training;Manual techniques;Passive range of motion;Dry needling;Energy conservation;Taping;Vasopneumatic Device;Joint Manipulations;Spinal Manipulations    PT Next Visit Plan review HEP, LTR, figure 4 stretch, STM and joint  mobs, review nerve glide, aquatic as tolerated    PT Home Exercise Plan Access Code [email or web address]    Consulted and Agree with Plan of Care Patient             Patient will benefit from skilled therapeutic intervention in order to improve the following deficits and impairments:  Abnormal gait, Pain, Improper body mechanics, Increased muscle spasms, Postural dysfunction, Decreased range of motion, Decreased strength, Hypomobility, Impaired flexibility, Difficulty walking  Visit Diagnosis: Pain, lumbar region  Muscle weakness (generalized)  Difficulty walking     Problem List Patient Active Problem List   Diagnosis Date Noted   Travel advice encounter 12/28/2017   Migraine without aura and without status migrainosus, not intractable 10/21/2014   HYPERLIPIDEMIA 04/03/2009   HYPERTENSION 04/03/2009    Zebedee Iba PT, DPT 09/17/20 12:23 PM  [facility] [address] [place], [place], [address] Phone: [phone]   Fax:  [phone]  Name: YAHIA BOTTGER MRN: [id number] Date of Birth: 1955-02-26

## 2020-09-20 ENCOUNTER — Ambulatory Visit (HOSPITAL_BASED_OUTPATIENT_CLINIC_OR_DEPARTMENT_OTHER): Payer: Self-pay | Admitting: Physical Therapy

## 2020-09-20 ENCOUNTER — Other Ambulatory Visit (HOSPITAL_BASED_OUTPATIENT_CLINIC_OR_DEPARTMENT_OTHER): Payer: Self-pay

## 2020-09-21 ENCOUNTER — Other Ambulatory Visit (HOSPITAL_BASED_OUTPATIENT_CLINIC_OR_DEPARTMENT_OTHER): Payer: Self-pay

## 2020-09-22 ENCOUNTER — Other Ambulatory Visit (HOSPITAL_BASED_OUTPATIENT_CLINIC_OR_DEPARTMENT_OTHER): Payer: Self-pay

## 2020-09-23 ENCOUNTER — Other Ambulatory Visit (HOSPITAL_BASED_OUTPATIENT_CLINIC_OR_DEPARTMENT_OTHER): Payer: Self-pay

## 2020-09-23 DIAGNOSIS — K080 Exfoliation of teeth due to systemic causes: Secondary | ICD-10-CM | POA: Diagnosis not present

## 2020-09-23 MED ORDER — CYCLOBENZAPRINE HCL 10 MG PO TABS
ORAL_TABLET | ORAL | 1 refills | Status: DC
  Filled 2020-09-23: qty 90, 30d supply, fill #0

## 2020-09-23 MED ORDER — LOSARTAN POTASSIUM 100 MG PO TABS
ORAL_TABLET | ORAL | 1 refills | Status: DC
  Filled 2020-09-23: qty 30, 30d supply, fill #0

## 2020-09-24 ENCOUNTER — Encounter (HOSPITAL_BASED_OUTPATIENT_CLINIC_OR_DEPARTMENT_OTHER): Payer: Self-pay | Admitting: Physical Therapy

## 2020-09-24 ENCOUNTER — Other Ambulatory Visit (HOSPITAL_BASED_OUTPATIENT_CLINIC_OR_DEPARTMENT_OTHER): Payer: Self-pay

## 2020-09-24 ENCOUNTER — Ambulatory Visit (HOSPITAL_BASED_OUTPATIENT_CLINIC_OR_DEPARTMENT_OTHER): Payer: Federal, State, Local not specified - PPO | Admitting: Physical Therapy

## 2020-09-24 ENCOUNTER — Other Ambulatory Visit: Payer: Self-pay

## 2020-09-24 DIAGNOSIS — M6281 Muscle weakness (generalized): Secondary | ICD-10-CM

## 2020-09-24 DIAGNOSIS — M545 Low back pain, unspecified: Secondary | ICD-10-CM | POA: Diagnosis not present

## 2020-09-24 DIAGNOSIS — R262 Difficulty in walking, not elsewhere classified: Secondary | ICD-10-CM

## 2020-09-24 NOTE — Therapy (Signed)
[facility] [address] [place], [place], [address] Phone: [phone]   Fax:  [phone]  Physical Therapy Treatment  Patient Details  Name: Kenneth Melendez MRN: [id number] Date of Birth: 1955-11-11 Referring Provider (PT): Venita Lick, MD   Encounter Date: 09/24/2020   PT End of Session - 09/24/20 0801     Visit Number 7    Number of Visits 17    Date for PT Re-Evaluation 11/21/20    Authorization Type BCBS    PT Start Time 0801    PT Stop Time 0841    PT Time Calculation (min) 40 min    Activity Tolerance Patient tolerated treatment well;Patient limited by pain    Behavior During Therapy [facility] for tasks assessed/performed             Past Medical History:  Diagnosis Date   Erectile dysfunction    Hemorrhoids    Hyperlipidemia    Hypertension    Migraine    followed by [facility]   Pre-diabetes    Urethral stricture     Past Surgical History:  Procedure Laterality Date   APPENDECTOMY  1970's   BUNIONECTOMY Right 2005  approx   COLONOSCOPY  04/20/2009   CYSTOSCOPY WITH URETHRAL DILATATION N/A 01/17/2016   Procedure: CYSTOSCOPY WITH URETHRAL  DILATATION;  Surgeon: [name], MD;  Location: [facility];  Service: Urology;  Laterality: N/A;   HEMORRHOIDECTOMY WITH HEMORRHOID BANDING  06/14/2010   INGUINAL HERNIA REPAIR Left 1996  approx   SHOULDER ARTHROSCOPY  01/02/2011   Procedure: ARTHROSCOPY SHOULDER;  Surgeon: [name];  Location: [facility];  Service: Orthopedics;  Laterality: Left;   SUPRAUMBILICAL HERNIA REPAIR W/ MESH  05/14/2009    There were no vitals filed for this visit.   Subjective Assessment - 09/24/20 0800     Subjective Pt states he is much more pain today. He has been having more pain for the last 2-3 days. He has done walking and worked in the yard for about an hour, but it was not more than the usual. Sitting down will make pain go up to 8 or 9/10.     Limitations Sitting;Standing;Walking;Lifting    How long can you sit comfortably? 5-6 hrs in a car    How long can you stand comfortably? unknown    How long can you walk comfortably? 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    Pain Score 7     Pain Location Leg    Pain Orientation Right    Pain Descriptors / Indicators Aching;Sore;Tingling               Pt seen for aquatic therapy today.  Treatment took place in water 3.25-4 ft in depth at the [facility] pool. Temp of water was 90.  Pt entered/exited the pool via stairs (alternating) independently with single rail.   Warm up: sidestepping 6x , retro walking 6x, QL stretch with noodle 30s 3x R and fwd only, standing fig4 stretch 30s 3x R, standing pool noodle rotation 20x   Exercises: standing hip ABD 3x10 ; standing hip extension 2x10 3s hold;noodle floating cycling in deeper end 20x3, standing march 20x, standing SKTC (stopped due to increased pain) standing hip circles 20x bilat, lateral step up at stair 2x10, standing HS stretch      Pt requires buoyancy for support and to offload  joints with strengthening exercises. Viscosity of the water is needed for resistance of strengthening; water current perturbations provides challenge to standing balance unsupported, requiring increased core activation.                  PT Short Term Goals - 08/23/20 1332       PT SHORT TERM GOAL #1   Title Pt will become independent with HEP in order to demonstrate synthesis of PT education.    Time 2    Period Weeks    Status New      PT SHORT TERM GOAL #2   Title Pt will demonstrate at least a 12.8 improvement in Oswestry Index in order to demonstrate a clinically significant change in LBP and function.    Time 4    Period Weeks    Status New      PT SHORT TERM GOAL #3   Title Pt will demonstrate/report ability to stand/sit/sleep without pain in order  to demonstrate functional improvement and tolerance to static positioning.    Time 4    Period Weeks    Status New               PT Long Term Goals - 08/23/20 1333       PT LONG TERM GOAL #1   Title Pt will become independent with final HEP in order to demonstrate synthesis of PT education.    Time 8    Period Weeks    Status New      PT LONG TERM GOAL #2   Title Pt will be able to perform 5XSTS in under 12  in order to demonstrate functional improvement above the cut off score for older adults.    Time 8    Period Weeks    Status New      PT LONG TERM GOAL #3   Title Pt will be able to demonstrate ability to run/jog without pain in order to demonstrate functional improvement and tolerance to low level plyometric loading.    Time 8    Period Weeks    Status New                   Plan - 09/24/20 0801     Clinical Impression Statement Pt session focused on R hip mobility, reducing pain, and improving flexibility. Pt reported slight relief of symptoms following session and had improvement with R hip pain. Aquatic therapy session more focused on hip and lateral rotators rather than L/S. Pt found good response with gentle hip resistance and lateral hip stretching. If pain continues to worsen at next sessions, may need to consider referral back to MD for further medical management. Pt would benefit from continued skilled therapy in order to reach goals and maximize functional lumbopelvic strength and ROM for prevention of further functional decline.    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;Occupation;Other    Stability/Clinical Decision Making Stable/Uncomplicated    Rehab Potential Good    PT Frequency 2x / week    PT Duration 8 weeks    PT Treatment/Interventions ADLs/Self  Care Home Management;Aquatic Therapy;Cryotherapy;Electrical Stimulation;Iontophoresis 4mg /ml Dexamethasone;Moist Heat;Traction;Ultrasound;Gait training;Stair training;Functional mobility training;Therapeutic activities;Therapeutic exercise;Balance training;Neuromuscular re-education;Patient/family education;Orthotic Fit/Training;Manual techniques;Passive range of motion;Dry needling;Energy conservation;Taping;Vasopneumatic Device;Joint Manipulations;Spinal Manipulations    PT Next Visit Plan review HEP, LTR, figure 4 stretch, STM and joint mobs, review nerve glide, aquatic as tolerated  PT Home Exercise Plan Access Code [email or web address]    Consulted and Agree with Plan of Care Patient             Patient will benefit from skilled therapeutic intervention in order to improve the following deficits and impairments:  Abnormal gait, Pain, Improper body mechanics, Increased muscle spasms, Postural dysfunction, Decreased range of motion, Decreased strength, Hypomobility, Impaired flexibility, Difficulty walking  Visit Diagnosis: Pain, lumbar region  Muscle weakness (generalized)  Difficulty walking     Problem List Patient Active Problem List   Diagnosis Date Noted   Travel advice encounter 12/28/2017   Migraine without aura and without status migrainosus, not intractable 10/21/2014   HYPERLIPIDEMIA 04/03/2009   HYPERTENSION 04/03/2009   Zebedee Iba PT, DPT 09/24/20 10:52 AM   [facility] [address] [place], [place], [address] Phone: [phone]   Fax:  [phone]  Name: TOREN TUCHOLSKI MRN: [id number] Date of Birth: [birth year]

## 2020-09-27 ENCOUNTER — Ambulatory Visit (HOSPITAL_BASED_OUTPATIENT_CLINIC_OR_DEPARTMENT_OTHER): Payer: Federal, State, Local not specified - PPO | Admitting: Physical Therapy

## 2020-09-27 ENCOUNTER — Other Ambulatory Visit: Payer: Self-pay

## 2020-09-27 ENCOUNTER — Encounter (HOSPITAL_BASED_OUTPATIENT_CLINIC_OR_DEPARTMENT_OTHER): Payer: Self-pay | Admitting: Physical Therapy

## 2020-09-27 ENCOUNTER — Other Ambulatory Visit (HOSPITAL_BASED_OUTPATIENT_CLINIC_OR_DEPARTMENT_OTHER): Payer: Self-pay

## 2020-09-27 DIAGNOSIS — M545 Low back pain, unspecified: Secondary | ICD-10-CM

## 2020-09-27 DIAGNOSIS — R262 Difficulty in walking, not elsewhere classified: Secondary | ICD-10-CM

## 2020-09-27 DIAGNOSIS — M6281 Muscle weakness (generalized): Secondary | ICD-10-CM

## 2020-09-27 NOTE — Therapy (Signed)
[facility] [address] [place], [place], [address] Phone: [phone]   Fax:  [phone]  Physical Therapy Treatment  Patient Details  Name: Kenneth Melendez MRN: [id number] Date of Birth: 1955/04/03 Referring Provider (PT): Venita Lick, MD   Encounter Date: 09/27/2020   PT End of Session - 09/27/20 0849     Visit Number 8    Number of Visits 17    Date for PT Re-Evaluation 11/21/20    Authorization Type BCBS    PT Start Time 0850    PT Stop Time 0930    PT Time Calculation (min) 40 min    Activity Tolerance Patient tolerated treatment well;Patient limited by pain    Behavior During Therapy [facility] for tasks assessed/performed             Past Medical History:  Diagnosis Date   Erectile dysfunction    Hemorrhoids    Hyperlipidemia    Hypertension    Migraine    followed by [facility]   Pre-diabetes    Urethral stricture     Past Surgical History:  Procedure Laterality Date   APPENDECTOMY  1970's   BUNIONECTOMY Right 2005  approx   COLONOSCOPY  04/20/2009   CYSTOSCOPY WITH URETHRAL DILATATION N/A 01/17/2016   Procedure: CYSTOSCOPY WITH URETHRAL  DILATATION;  Surgeon: [name], MD;  Location: [facility];  Service: Urology;  Laterality: N/A;   HEMORRHOIDECTOMY WITH HEMORRHOID BANDING  06/14/2010   INGUINAL HERNIA REPAIR Left 1996  approx   SHOULDER ARTHROSCOPY  01/02/2011   Procedure: ARTHROSCOPY SHOULDER;  Surgeon: [name];  Location: [facility];  Service: Orthopedics;  Laterality: Left;   SUPRAUMBILICAL HERNIA REPAIR W/ MESH  05/14/2009    There were no vitals filed for this visit.   Subjective Assessment - 09/27/20 0848     Subjective Pt states he was in pain waking up this morning. He had relief from last session for about an hour. He states that mornings are tougher now.    Limitations Sitting;Standing;Walking;Lifting    How long can you sit comfortably? 5-6 hrs  in a car    How long can you stand comfortably? unknown    How long can you walk comfortably? 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    Currently in Pain? Yes    Pain Score 5     Pain Location Leg    Pain Orientation Right    Pain Descriptors / Indicators Aching;Sore;Tingling              Pt seen for aquatic therapy today.  Treatment took place in water 3.25-4 ft in depth at the [facility] pool. Temp of water was 92.  Pt entered/exited the pool via stairs (alternating) independently with single rail.   Warm up: sidestepping with squat 6x , retro walking with half lunge 6x, QL stretch with noodle 30s 3x R and fwd only, standing fig4 stretch 30s 3x R, standing pool noodle rotation 20x   Exercises: standing hip ABD 3x10 ; standing hip extension 2x10 3s hold; standing march 20x, standing hip circles 20x bilat, lateral step up at stair 2x10, bench depth squat 3x10; standing HS stretch 30s 3x,       Pt requires buoyancy for support and to offload joints with strengthening exercises. Viscosity of the water is needed for resistance of strengthening; water current perturbations provides challenge  to standing balance unsupported, requiring increased core activation.                   PT Short Term Goals - 08/23/20 1332       PT SHORT TERM GOAL #1   Title Pt will become independent with HEP in order to demonstrate synthesis of PT education.    Time 2    Period Weeks    Status New      PT SHORT TERM GOAL #2   Title Pt will demonstrate at least a 12.8 improvement in Oswestry Index in order to demonstrate a clinically significant change in LBP and function.    Time 4    Period Weeks    Status New      PT SHORT TERM GOAL #3   Title Pt will demonstrate/report ability to stand/sit/sleep without pain in order to demonstrate functional improvement and tolerance to static positioning.    Time 4    Period  Weeks    Status New               PT Long Term Goals - 08/23/20 1333       PT LONG TERM GOAL #1   Title Pt will become independent with final HEP in order to demonstrate synthesis of PT education.    Time 8    Period Weeks    Status New      PT LONG TERM GOAL #2   Title Pt will be able to perform 5XSTS in under 12  in order to demonstrate functional improvement above the cut off score for older adults.    Time 8    Period Weeks    Status New      PT LONG TERM GOAL #3   Title Pt will be able to demonstrate ability to run/jog without pain in order to demonstrate functional improvement and tolerance to low level plyometric loading.    Time 8    Period Weeks    Status New                   Plan - 09/27/20 0160     Clinical Impression Statement Pt able to progress functional movements at today's session incorporate more hip based strengthening. Pt able to perform more CKC exercise without irritation of pain and had report of pain reduction to 1/10 by end of session. Discussed potential need for more medical management with pt and potential referral back to MD if pain does not subside within 1-2 more weeks. Pt program focusing on hip mobility nd strengthening does appear to be more effective than lumbar directed exercise. Pt would benefit from continued skilled therapy in order to reach goals and maximize functional lumbopelvic strength and ROM for prevention of further functional decline.    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;Occupation;Other    Stability/Clinical Decision Making Stable/Uncomplicated    Rehab Potential Good    PT Frequency 2x / week    PT Duration 8 weeks    PT Treatment/Interventions ADLs/Self Care Home Management;Aquatic Therapy;Cryotherapy;Electrical  Stimulation;Iontophoresis 4mg /ml Dexamethasone;Moist Heat;Traction;Ultrasound;Gait training;Stair training;Functional mobility training;Therapeutic activities;Therapeutic exercise;Balance training;Neuromuscular re-education;Patient/family education;Orthotic Fit/Training;Manual techniques;Passive range of motion;Dry needling;Energy conservation;Taping;Vasopneumatic Device;Joint Manipulations;Spinal Manipulations    PT Next Visit Plan review HEP, LTR, figure 4 stretch, STM and joint mobs, review nerve glide, aquatic as tolerated    PT Home Exercise Plan Access Code [email or web address]  Consulted and Agree with Plan of Care Patient             Patient will benefit from skilled therapeutic intervention in order to improve the following deficits and impairments:  Abnormal gait, Pain, Improper body mechanics, Increased muscle spasms, Postural dysfunction, Decreased range of motion, Decreased strength, Hypomobility, Impaired flexibility, Difficulty walking  Visit Diagnosis: Pain, lumbar region  Muscle weakness (generalized)  Difficulty walking     Problem List Patient Active Problem List   Diagnosis Date Noted   Travel advice encounter 12/28/2017   Migraine without aura and without status migrainosus, not intractable 10/21/2014   HYPERLIPIDEMIA 04/03/2009   HYPERTENSION 04/03/2009   Zebedee Iba PT, DPT 09/27/20 2:26 PM   [facility] [address] [place], [place], [address] Phone: [phone]   Fax:  [phone]  Name: Kenneth Melendez MRN: [id number] Date of Birth: [birth year]

## 2020-09-28 ENCOUNTER — Other Ambulatory Visit (HOSPITAL_BASED_OUTPATIENT_CLINIC_OR_DEPARTMENT_OTHER): Payer: Self-pay

## 2020-09-28 DIAGNOSIS — M5459 Other low back pain: Secondary | ICD-10-CM | POA: Diagnosis not present

## 2020-09-28 DIAGNOSIS — M545 Low back pain, unspecified: Secondary | ICD-10-CM | POA: Diagnosis not present

## 2020-09-28 MED ORDER — SUMATRIPTAN SUCCINATE 100 MG PO TABS: 100.0000 mg | ORAL_TABLET | ORAL | 5 refills | Status: DC | PRN

## 2020-09-29 ENCOUNTER — Other Ambulatory Visit (HOSPITAL_BASED_OUTPATIENT_CLINIC_OR_DEPARTMENT_OTHER): Payer: Self-pay

## 2020-10-01 ENCOUNTER — Other Ambulatory Visit (HOSPITAL_BASED_OUTPATIENT_CLINIC_OR_DEPARTMENT_OTHER): Payer: Self-pay

## 2020-10-01 ENCOUNTER — Other Ambulatory Visit: Payer: Self-pay

## 2020-10-01 ENCOUNTER — Ambulatory Visit (HOSPITAL_BASED_OUTPATIENT_CLINIC_OR_DEPARTMENT_OTHER): Payer: Federal, State, Local not specified - PPO | Admitting: Physical Therapy

## 2020-10-01 ENCOUNTER — Encounter (HOSPITAL_BASED_OUTPATIENT_CLINIC_OR_DEPARTMENT_OTHER): Payer: Self-pay | Admitting: Physical Therapy

## 2020-10-01 DIAGNOSIS — M5416 Radiculopathy, lumbar region: Secondary | ICD-10-CM | POA: Diagnosis not present

## 2020-10-01 DIAGNOSIS — M545 Low back pain, unspecified: Secondary | ICD-10-CM

## 2020-10-01 DIAGNOSIS — R262 Difficulty in walking, not elsewhere classified: Secondary | ICD-10-CM

## 2020-10-01 DIAGNOSIS — M6281 Muscle weakness (generalized): Secondary | ICD-10-CM

## 2020-10-01 NOTE — Therapy (Signed)
[facility] [address] [place], [place], [address] Phone: [phone]   Fax:  [phone]  Physical Therapy Progress Note  Patient Details  Name: Kenneth Melendez MRN: [id number] Date of Birth: 05-04-1955 Referring Provider (PT): Melina Schools, MD   Encounter Date: 10/01/2020   PT End of Session - 10/01/20 0805     Visit Number 9    Number of Visits 17    Date for PT Re-Evaluation 11/21/20    Authorization Type BCBS    PT Start Time 0800    PT Stop Time 0840    PT Time Calculation (min) 40 min    Activity Tolerance Patient tolerated treatment well;Patient limited by pain    Behavior During Therapy [facility] for tasks assessed/performed             Past Medical History:  Diagnosis Date   Erectile dysfunction    Hemorrhoids    Hyperlipidemia    Hypertension    Migraine    followed by [facility]   Pre-diabetes    Urethral stricture     Past Surgical History:  Procedure Laterality Date   APPENDECTOMY  1970's   BUNIONECTOMY Right 2005  approx   COLONOSCOPY  04/20/2009   CYSTOSCOPY WITH URETHRAL DILATATION N/A 01/17/2016   Procedure: CYSTOSCOPY WITH URETHRAL  DILATATION;  Surgeon: Cleon Gustin, MD;  Location: [facility];  Service: Urology;  Laterality: N/A;   HEMORRHOIDECTOMY WITH HEMORRHOID BANDING  06/14/2010   INGUINAL HERNIA REPAIR Left 1996  approx   SHOULDER ARTHROSCOPY  01/02/2011   Procedure: ARTHROSCOPY SHOULDER;  Surgeon: Sharmon Revere;  Location: [place];  Service: Orthopedics;  Laterality: Left;   SUPRAUMBILICAL HERNIA REPAIR W/ MESH  05/14/2009    There were no vitals filed for this visit.   Subjective Assessment - 10/01/20 0804     Subjective Pt states he has had his gabapentin dosage modified from the MD as well as supplementing it with Tylenol. He has had little to no pain the last two days.    Limitations Sitting;Standing;Walking;Lifting    How long can you sit  comfortably? 5-6 hrs in a car    How long can you stand comfortably? unknown    How long can you walk comfortably? 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    Currently in Pain? No/denies                [facility] PT Assessment - 10/01/20 0001       Assessment   Medical Diagnosis M54.50 (ICD-10-CM) - Low back pain, unspecified    Referring Provider (PT) Melina Schools, MD    Prior Therapy Aquatic therapy over 10 years ago      Precautions   Precautions None      Restrictions   Weight Bearing Restrictions No      Balance Screen   Has the patient fallen in the past 6 months No      [place] residence    Living Arrangements Spouse/significant other    Type of [place]      Prior Function   Level of Independence Independent      Cognition   Overall Cognitive Status Within Functional Limits for tasks assessed      Observation/Other Assessments   Other Surveys  Oswestry Disability Index    Oswestry Disability Index  Oswestry  Score: 14 / 50 or 28 %     Sensation   Light Touch Impaired by gross assessment      Functional Tests   Functional tests Sit to Stand      Sit to Stand   Comments 5XSTS  11.2s      Posture/Postural Control   Posture/Postural Control Postural limitations    Postural Limitations Decreased lumbar lordosis;Increased thoracic kyphosis      AROM   Overall AROM Comments L/S: flexion 60%, ext 80%, R rot 75%, L rot WFL, R SB 75% , L SB 75%      PROM   Overall PROM Comments bilateral hip stiffness, pain at end range flexion and IR      Strength   Overall Strength Comments R hip 4+/5 throughout, L hip 5/5 throughout                                                                                                 [facility] Adult PT Treatment/Exercise - 10/01/20 0001                        Exercises   Exercises  Lumbar      Lumbar Exercises: Stretches   Press Ups Limitations standing hip ext 3s 10x at wall    Piriformis Stretch Limitations both knees bent, R 30s 3x    Other Lumbar Stretch Exercise seated flexion stretch swissball 10x 5s      Lumbar Exercises: Seated   Other Seated Lumbar Exercises McGill curl up 15x      Lumbar Exercises: Supine   Bridge Limitations fig 4 bridge 2x10      Lumbar Exercises: Quadruped   Other Quadruped Lumbar Exercises child's pose 5s 10x    Other Quadruped Lumbar Exercises cat camel 10x 3s hold      Manual Therapy   Manual Therapy Joint mobilization;Soft tissue mobilization    Joint Mobilization L2-5 bilat UPA grade III bilat, CPA grade III    Soft tissue mobilization R gluteals and hip rotators, strain counter strain                    PT Education - 10/01/20 0805     Education Details joint protection, acceptable levels of pain with ADL and HEP, exam findings    Person(s) Educated Patient    Methods Explanation;Demonstration    Comprehension Verbalized understanding;Returned demonstration              PT Short Term Goals - 10/01/20 1040       PT SHORT TERM GOAL #1   Title Pt will become independent with HEP in order to demonstrate synthesis of PT education.    Time 2    Period Weeks    Status Achieved      PT SHORT TERM GOAL #2   Title Pt will demonstrate at least a 12.8 improvement in Oswestry Index in order to demonstrate a clinically significant change in LBP and function.    Time 4    Period Weeks    Status Partially Met  PT SHORT TERM GOAL #3   Title Pt will demonstrate/report ability to stand/sit/sleep without pain in order to demonstrate functional improvement and tolerance to static positioning.    Time 4    Period Weeks    Status Partially Met               PT Long Term Goals - 10/01/20 1041       PT LONG TERM GOAL #1   Title Pt will become independent with final HEP in order to demonstrate  synthesis of PT education.    Time 8    Period Weeks    Status On-going      PT LONG TERM GOAL #2   Title Pt will be able to perform 5XSTS in under 12  in order to demonstrate functional improvement above the cut off score for older adults.    Time 8    Period Weeks    Status Achieved      PT LONG TERM GOAL #3   Title Pt will be able to demonstrate ability to run/jog without pain in order to demonstrate functional improvement and tolerance to low level plyometric loading.    Time 8    Period Weeks    Status On-going                   Plan - 10/01/20 0806     Clinical Impression Statement Pt demonstrates signficnat improvement in both quantative and qualatative measure as compared to initial eval. Pt has improved LE strength though pt continues to have lumbar ROM restriction. Pt with recent medication change that appears to relieve pain and allow for more effective exercise. Pt responded well today to mnaual therapy and exercise with reduction of pain to 2/10.  Pt to continue with lumbopelvic strengthening as able. HEP also progressed to progress flexibility and strength. Pt to alternate land and aquatic visits due to improvement following manual therapy. Pt would benefit from continued skilled therapy in order to reach goals and maximize functional lumbopelvic strength and ROM for full return to PLOF.    Personal Factors and Comorbidities Comorbidity 1;Comorbidity 2;Profession;Fitness    Examination-Activity Limitations Transfers;Bend;Sleep;Stairs;Squat;Stand;Lift    Examination-Participation Restrictions Systems developer Activity;Occupation;Other    Stability/Clinical Decision Making Stable/Uncomplicated    Rehab Potential Good    PT Frequency 2x / week    PT Duration 8 weeks    PT Treatment/Interventions ADLs/Self Care Home Management;Aquatic Therapy;Cryotherapy;Electrical Stimulation;Iontophoresis 69m/ml Dexamethasone;Moist Heat;Traction;Ultrasound;Gait training;Stair  training;Functional mobility training;Therapeutic activities;Therapeutic exercise;Balance training;Neuromuscular re-education;Patient/family education;Orthotic Fit/Training;Manual techniques;Passive range of motion;Dry needling;Energy conservation;Taping;Vasopneumatic Device;Joint Manipulations;Spinal Manipulations    PT Next Visit Plan review HEP, LTR, figure 4 stretch, STM and joint mobs, review nerve glide, aquatic as tolerated    PT Home Exercise Plan Access Code [email or web address]    Consulted and Agree with Plan of Care Patient             Patient will benefit from skilled therapeutic intervention in order to improve the following deficits and impairments:  Abnormal gait, Pain, Improper body mechanics, Increased muscle spasms, Postural dysfunction, Decreased range of motion, Decreased strength, Hypomobility, Impaired flexibility, Difficulty walking  Visit Diagnosis: Pain, lumbar region  Muscle weakness (generalized)  Difficulty walking     Problem List Patient Active Problem List   Diagnosis Date Noted   Travel advice encounter 12/28/2017   Migraine without aura and without status migrainosus, not intractable 10/21/2014   HYPERLIPIDEMIA 04/03/2009   HYPERTENSION 04/03/2009   ADaleen BoPT, DPT 10/01/20 10:41 AM  [facility] [address] [place], [place], [address] Phone: [phone]   Fax:  [phone]  Name: Kenneth Melendez MRN: [id number] Date of Birth: 1955/06/07

## 2020-10-04 ENCOUNTER — Encounter (HOSPITAL_BASED_OUTPATIENT_CLINIC_OR_DEPARTMENT_OTHER): Payer: Self-pay | Admitting: Physical Therapy

## 2020-10-04 ENCOUNTER — Other Ambulatory Visit (HOSPITAL_BASED_OUTPATIENT_CLINIC_OR_DEPARTMENT_OTHER): Payer: Self-pay

## 2020-10-04 ENCOUNTER — Other Ambulatory Visit: Payer: Self-pay

## 2020-10-04 ENCOUNTER — Ambulatory Visit (HOSPITAL_BASED_OUTPATIENT_CLINIC_OR_DEPARTMENT_OTHER): Payer: Federal, State, Local not specified - PPO | Admitting: Physical Therapy

## 2020-10-04 DIAGNOSIS — R262 Difficulty in walking, not elsewhere classified: Secondary | ICD-10-CM

## 2020-10-04 DIAGNOSIS — M545 Low back pain, unspecified: Secondary | ICD-10-CM

## 2020-10-04 DIAGNOSIS — M5416 Radiculopathy, lumbar region: Secondary | ICD-10-CM | POA: Diagnosis not present

## 2020-10-04 DIAGNOSIS — M6281 Muscle weakness (generalized): Secondary | ICD-10-CM | POA: Diagnosis not present

## 2020-10-04 MED ORDER — GABAPENTIN 300 MG PO CAPS
ORAL_CAPSULE | ORAL | 3 refills | Status: DC
  Filled 2020-10-04: qty 120, 30d supply, fill #0

## 2020-10-04 MED ORDER — OXYCODONE-ACETAMINOPHEN 10-325 MG PO TABS
ORAL_TABLET | ORAL | 0 refills | Status: DC
  Filled 2020-10-04: qty 15, 5d supply, fill #0

## 2020-10-04 MED ORDER — DICLOFENAC SODIUM 75 MG PO TBEC
75.0000 mg | DELAYED_RELEASE_TABLET | Freq: Two times a day (BID) | ORAL | 3 refills | Status: DC
  Filled 2020-10-04 – 2020-10-08 (×2): qty 60, 30d supply, fill #0

## 2020-10-04 NOTE — Therapy (Signed)
[facility] [address] [place], [place], [address] Phone: [phone]   Fax:  [phone]  Physical Therapy Treatment  Patient Details  Name: Kenneth Melendez MRN: [id number] Date of Birth: [birth year] Referring Provider (PT): Melina Schools, MD   Encounter Date: 10/04/2020   PT End of Session - 10/04/20 0854     Visit Number 10    Number of Visits 17    Date for PT Re-Evaluation 11/21/20    Authorization Type BCBS    PT Start Time 0810    PT Stop Time 0850    PT Time Calculation (min) 40 min    Activity Tolerance Patient tolerated treatment well;Patient limited by pain    Behavior During Therapy [facility] for tasks assessed/performed             Past Medical History:  Diagnosis Date   Erectile dysfunction    Hemorrhoids    Hyperlipidemia    Hypertension    Migraine    followed by [facility]   Pre-diabetes    Urethral stricture     Past Surgical History:  Procedure Laterality Date   APPENDECTOMY  1970's   BUNIONECTOMY Right 2005  approx   COLONOSCOPY  04/20/2009   CYSTOSCOPY WITH URETHRAL DILATATION N/A 01/17/2016   Procedure: CYSTOSCOPY WITH URETHRAL  DILATATION;  Surgeon: Cleon Gustin, MD;  Location: [facility];  Service: Urology;  Laterality: N/A;   HEMORRHOIDECTOMY WITH HEMORRHOID BANDING  06/14/2010   INGUINAL HERNIA REPAIR Left 1996  approx   SHOULDER ARTHROSCOPY  01/02/2011   Procedure: ARTHROSCOPY SHOULDER;  Surgeon: Sharmon Revere;  Location: [facility];  Service: Orthopedics;  Laterality: Left;   SUPRAUMBILICAL HERNIA REPAIR W/ MESH  05/14/2009    There were no vitals filed for this visit.   Subjective Assessment - 10/04/20 0811     Subjective Pt states he is in more pain today. He did his new HEP 4-5x and did more walking. His pain has kept him up all night.    Limitations Sitting;Standing;Walking;Lifting    How long can you sit comfortably? 5-6 hrs in a car    How long  can you stand comfortably? unknown    How long can you walk comfortably? 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    Currently in Pain? Yes    Pain Score 5     Pain Location Leg    Pain Orientation Right    Pain Descriptors / Indicators Aching;Sore;Tingling;Constant               Pt seen for aquatic therapy today.  Treatment took place in water 3.25-4 ft in depth at the [facility] pool. Temp of water was 92.  Pt entered/exited the pool via stairs (step to pattern) independently with bilat rail.   Warm up: sidestepping, fwd, retro walking 4x each, standing fig 4 stretch 30s 3x   Exercises:  standing quad and HS noodle stretch at edge of pool 30s 3x each, standing lumbar extension 3s 10x, standing hip ABD 2x10, lateral step up 2x10 on R, standing hip ABD/circles 20x each, STS from bench 10x (stopped due to increase in pain), standing board press and row in half squat 20x, standing board rotation 20x   Manual: Bad Ragaaz ankle hold lumbar mobilization, medial lateral, full floatation 7 min       Pt requires buoyancy for support and to  offload joints with strengthening exercises. Viscosity of the water is needed for resistance of strengthening; water current perturbations provides challenge to standing balance unsupported, requiring increased core activation.             PT Education - 10/04/20 0854     Education Details joint protection, acceptable levels of pain with ADL and HEP, pain management, stress/anxiety/rest effects on pain, HEP/walking frequency    Person(s) Educated Patient    Methods Explanation;Demonstration    Comprehension Verbalized understanding;Returned demonstration              PT Short Term Goals - 10/01/20 1040       PT SHORT TERM GOAL #1   Title Pt will become independent with HEP in order to demonstrate synthesis of PT education.    Time 2    Period Weeks    Status  Achieved      PT SHORT TERM GOAL #2   Title Pt will demonstrate at least a 12.8 improvement in Oswestry Index in order to demonstrate a clinically significant change in LBP and function.    Time 4    Period Weeks    Status Partially Met      PT SHORT TERM GOAL #3   Title Pt will demonstrate/report ability to stand/sit/sleep without pain in order to demonstrate functional improvement and tolerance to static positioning.    Time 4    Period Weeks    Status Partially Met               PT Long Term Goals - 10/01/20 1041       PT LONG TERM GOAL #1   Title Pt will become independent with final HEP in order to demonstrate synthesis of PT education.    Time 8    Period Weeks    Status On-going      PT LONG TERM GOAL #2   Title Pt will be able to perform 5XSTS in under 12  in order to demonstrate functional improvement above the cut off score for older adults.    Time 8    Period Weeks    Status Achieved      PT LONG TERM GOAL #3   Title Pt will be able to demonstrate ability to run/jog without pain in order to demonstrate functional improvement and tolerance to low level plyometric loading.    Time 8    Period Weeks    Status On-going                   Plan - 10/04/20 6045     Clinical Impression Statement Pt with continued R LE pain at beginning of today's session. Trialed Bag Ragaz style lumbar mobilization technique to relieve LE pain and pt report 1/10 pain following manual technique. Pt increase in pain may be related to relative mid distance walking in conjunction with performing HEP at an increased frequency. Pt reported sleeping habits and difficutly with rest also likely contributing to pain increase. Pt given edu about sleeping positioning and acceptable level of pain/load management with ADL and general exercise. Pt to be back on land for next visit to continue with manual therapy. Pt would benefit from continued skilled therapy in order to reach goals and  maximize functional lumbopelvic strength and ROM for full return to PLOF.    Personal Factors and Comorbidities Comorbidity 1;Comorbidity 2;Profession;Fitness    Examination-Activity Limitations Transfers;Bend;Sleep;Stairs;Squat;Stand;Lift    Examination-Participation Restrictions Systems developer Activity;Occupation;Other    Stability/Clinical Decision  Making Stable/Uncomplicated    Rehab Potential Good    PT Frequency 2x / week    PT Duration 8 weeks    PT Treatment/Interventions ADLs/Self Care Home Management;Aquatic Therapy;Cryotherapy;Electrical Stimulation;Iontophoresis 45m/ml Dexamethasone;Moist Heat;Traction;Ultrasound;Gait training;Stair training;Functional mobility training;Therapeutic activities;Therapeutic exercise;Balance training;Neuromuscular re-education;Patient/family education;Orthotic Fit/Training;Manual techniques;Passive range of motion;Dry needling;Energy conservation;Taping;Vasopneumatic Device;Joint Manipulations;Spinal Manipulations    PT Next Visit Plan review HEP, LTR, figure 4 stretch, STM and joint mobs, review nerve glide, aquatic as tolerated    PT Home Exercise Plan Access Code [email or web address]    Consulted and Agree with Plan of Care Patient             Patient will benefit from skilled therapeutic intervention in order to improve the following deficits and impairments:  Abnormal gait, Pain, Improper body mechanics, Increased muscle spasms, Postural dysfunction, Decreased range of motion, Decreased strength, Hypomobility, Impaired flexibility, Difficulty walking  Visit Diagnosis: Pain, lumbar region  Muscle weakness (generalized)  Difficulty walking     Problem List Patient Active Problem List   Diagnosis Date Noted   Travel advice encounter 12/28/2017   Migraine without aura and without status migrainosus, not intractable 10/21/2014   HYPERLIPIDEMIA 04/03/2009   HYPERTENSION 04/03/2009   ADaleen BoPT, DPT 10/04/20 9:08 AM   [place] [address]Phone: [phone]  Fax:  [phone] Name: Kenneth BAYUSMRN: [id number] of Birth: 9February 22, 1957

## 2020-10-05 ENCOUNTER — Other Ambulatory Visit (HOSPITAL_BASED_OUTPATIENT_CLINIC_OR_DEPARTMENT_OTHER): Payer: Self-pay

## 2020-10-08 ENCOUNTER — Other Ambulatory Visit (HOSPITAL_BASED_OUTPATIENT_CLINIC_OR_DEPARTMENT_OTHER): Payer: Self-pay

## 2020-10-08 ENCOUNTER — Ambulatory Visit (HOSPITAL_BASED_OUTPATIENT_CLINIC_OR_DEPARTMENT_OTHER): Payer: Federal, State, Local not specified - PPO | Attending: Orthopedic Surgery | Admitting: Physical Therapy

## 2020-10-08 ENCOUNTER — Other Ambulatory Visit: Payer: Self-pay

## 2020-10-08 ENCOUNTER — Encounter (HOSPITAL_BASED_OUTPATIENT_CLINIC_OR_DEPARTMENT_OTHER): Payer: Self-pay | Admitting: Physical Therapy

## 2020-10-08 DIAGNOSIS — M6281 Muscle weakness (generalized): Secondary | ICD-10-CM | POA: Insufficient documentation

## 2020-10-08 DIAGNOSIS — M545 Low back pain, unspecified: Secondary | ICD-10-CM | POA: Insufficient documentation

## 2020-10-08 DIAGNOSIS — R262 Difficulty in walking, not elsewhere classified: Secondary | ICD-10-CM | POA: Diagnosis not present

## 2020-10-08 NOTE — Therapy (Addendum)
[facility] [address] [place], [place], [address] Phone: [phone]   Fax:  [phone]  Physical Therapy Treatment/Discharge  Patient Details  Name: DIALLO PONDER MRN: [id number] Date of Birth: 1955/10/03 Referring Provider (PT): Melina Schools, MD   Encounter Date: 10/08/2020   PT End of Session - 10/08/20 0831     Visit Number 11    Number of Visits 17    Date for PT Re-Evaluation 11/21/20    Authorization Type BCBS    PT Start Time 0815   pt arrives late   PT Stop Time 0843    PT Time Calculation (min) 28 min    Activity Tolerance Patient tolerated treatment well;Patient limited by pain    Behavior During Therapy [facility] for tasks assessed/performed             Past Medical History:  Diagnosis Date   Erectile dysfunction    Hemorrhoids    Hyperlipidemia    Hypertension    Migraine    followed by [facility]   Pre-diabetes    Urethral stricture     Past Surgical History:  Procedure Laterality Date   APPENDECTOMY  1970's   BUNIONECTOMY Right 2005  approx   COLONOSCOPY  04/20/2009   CYSTOSCOPY WITH URETHRAL DILATATION N/A 01/17/2016   Procedure: CYSTOSCOPY WITH URETHRAL  DILATATION;  Surgeon: Cleon Gustin, MD;  Location: [facility];  Service: Urology;  Laterality: N/A;   HEMORRHOIDECTOMY WITH HEMORRHOID BANDING  06/14/2010   INGUINAL HERNIA REPAIR Left 1996  approx   SHOULDER ARTHROSCOPY  01/02/2011   Procedure: ARTHROSCOPY SHOULDER;  Surgeon: Sharmon Revere;  Location: [facility];  Service: Orthopedics;  Laterality: Left;   SUPRAUMBILICAL HERNIA REPAIR W/ MESH  05/14/2009    There were no vitals filed for this visit.   Subjective Assessment - 10/08/20 0814     Subjective Pt states the pain is about 5 or 6/10 at the moment. He did travel to [place] over the weekend and the sitting made the tingling worse. Pt states he will likely go with the surgical route after his most recent MD  visit. Sx will be for discectomy    Limitations Sitting;Standing;Walking;Lifting    How long can you sit comfortably? 5-6 hrs in a car    How long can you stand comfortably? unknown    How long can you walk comfortably? 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    Currently in Pain? Yes    Pain Score 6     Pain Location Leg    Pain Orientation Right    Pain Descriptors / Indicators Aching;Sore;Tingling;Constant                   [facility] Adult PT Treatment/Exercise - 10/08/20 0001       Posture/Postural Control   Posture/Postural Control Postural limitations    Postural Limitations Decreased lumbar lordosis;Increased thoracic kyphosis      Exercises   Exercises Lumbar      Lumbar Exercises: Stretches   Press Ups Limitations standing hip ext 3s 10x at wall              Lumbar Exercises: Seated   Other Seated Lumbar Exercises McGill curl up 15x      Lumbar Exercises: Supine   Bridge Limitations fig 4 bridge 2x10      Lumbar Exercises: Prone   Other  Prone Lumbar Exercises prone press up to elbow and hands 5s 10x      Lumbar Exercises: Quadruped       Other Quadruped Lumbar Exercises cat camel 10x 3s hold      Manual Therapy   Manual Therapy Joint mobilization;Soft tissue mobilization    Joint Mobilization L2-5 bilat UPA grade III bilat, CPA grade III    Soft tissue mobilization R gluteals and hip rotators, strain counter strain                    PT Education - 10/08/20 0830     Education Details joint protection, acceptable levels of pain with ADL and HEP, pain management, surgical intervention and PT planning, HEP    Person(s) Educated Patient    Methods Explanation;Demonstration;Tactile cues;Verbal cues    Comprehension Verbalized understanding;Returned demonstration              PT Short Term Goals - 10/01/20 1040       PT SHORT TERM GOAL #1   Title Pt will become independent  with HEP in order to demonstrate synthesis of PT education.    Time 2    Period Weeks    Status Achieved      PT SHORT TERM GOAL #2   Title Pt will demonstrate at least a 12.8 improvement in Oswestry Index in order to demonstrate a clinically significant change in LBP and function.    Time 4    Period Weeks    Status Partially Met      PT SHORT TERM GOAL #3   Title Pt will demonstrate/report ability to stand/sit/sleep without pain in order to demonstrate functional improvement and tolerance to static positioning.    Time 4    Period Weeks    Status Partially Met               PT Long Term Goals - 10/01/20 1041       PT LONG TERM GOAL #1   Title Pt will become independent with final HEP in order to demonstrate synthesis of PT education.    Time 8    Period Weeks    Status On-going      PT LONG TERM GOAL #2   Title Pt will be able to perform 5XSTS in under 12  in order to demonstrate functional improvement above the cut off score for older adults.    Time 8    Period Weeks    Status Achieved      PT LONG TERM GOAL #3   Title Pt will be able to demonstrate ability to run/jog without pain in order to demonstrate functional improvement and tolerance to low level plyometric loading.    Time 8    Period Weeks    Status On-going                   Plan - 10/08/20 2025     Clinical Impression Statement Pt reports decreased pain after manual therapy and exercise at today's session. Due to limited appt time, session was focused on reducing pt pain and improving ROM. Pt reports planning on pursuing discectomy soon. Depending on timeline of surgery, pt may continue to benefit to come 1-2 times for pain management as well and maximizing function prior to surgery. Plan for 1 visit every other week in order to prevent loss of current gains. Pt will plan to return after sx for rehab.    Personal Factors and Comorbidities Comorbidity  1;Comorbidity 2;Profession;Fitness     Examination-Activity Limitations Transfers;Bend;Sleep;Stairs;Squat;Stand;Lift    Examination-Participation Restrictions Systems developer Activity;Occupation;Other    Stability/Clinical Decision Making Stable/Uncomplicated    Rehab Potential Good    PT Frequency 2x / week    PT Duration 8 weeks    PT Treatment/Interventions ADLs/Self Care Home Management;Aquatic Therapy;Cryotherapy;Electrical Stimulation;Iontophoresis 24m/ml Dexamethasone;Moist Heat;Traction;Ultrasound;Gait training;Stair training;Functional mobility training;Therapeutic activities;Therapeutic exercise;Balance training;Neuromuscular re-education;Patient/family education;Orthotic Fit/Training;Manual techniques;Passive range of motion;Dry needling;Energy conservation;Taping;Vasopneumatic Device;Joint Manipulations;Spinal Manipulations    PT Next Visit Plan review HEP, LTR, figure 4 stretch, STM and joint mobs, review nerve glide, aquatic as tolerated    PT Home Exercise Plan Access Code [email or web address]    Consulted and Agree with Plan of Care Patient             Patient will benefit from skilled therapeutic intervention in order to improve the following deficits and impairments:  Abnormal gait, Pain, Improper body mechanics, Increased muscle spasms, Postural dysfunction, Decreased range of motion, Decreased strength, Hypomobility, Impaired flexibility, Difficulty walking  Visit Diagnosis: Pain, lumbar region  Muscle weakness (generalized)  Difficulty walking     Problem List Patient Active Problem List   Diagnosis Date Noted   Travel advice encounter 12/28/2017   Migraine without aura and without status migrainosus, not intractable 10/21/2014   HYPERLIPIDEMIA 04/03/2009   HYPERTENSION 04/03/2009   ADaleen BoPT, DPT 10/08/20 10:30 AM  [place] [address]Phone: [phone]  Fax:  [phone] Name: JHASEEB FIALLOSMRN:  [id number] of Birth: 9January 17, 1957  PHYSICAL THERAPY NO VISIT DISCHARGE SUMMARY  Visits from Start of Care: 11  Plan:  Patient goals were unable to be assessed. Patient is being discharged due to not returning to PT.

## 2020-10-09 ENCOUNTER — Emergency Department (HOSPITAL_BASED_OUTPATIENT_CLINIC_OR_DEPARTMENT_OTHER)
Admission: EM | Admit: 2020-10-09 | Discharge: 2020-10-09 | Disposition: A | Discharge status: Home / Self Care | Payer: Federal, State, Local not specified - PPO | Attending: Emergency Medicine | Admitting: Emergency Medicine

## 2020-10-09 ENCOUNTER — Other Ambulatory Visit: Payer: Self-pay

## 2020-10-09 ENCOUNTER — Emergency Department (HOSPITAL_BASED_OUTPATIENT_CLINIC_OR_DEPARTMENT_OTHER): Payer: Federal, State, Local not specified - PPO

## 2020-10-09 DIAGNOSIS — I4892 Unspecified atrial flutter: Secondary | ICD-10-CM | POA: Diagnosis not present

## 2020-10-09 DIAGNOSIS — R0602 Shortness of breath: Secondary | ICD-10-CM | POA: Diagnosis not present

## 2020-10-09 DIAGNOSIS — R002 Palpitations: Secondary | ICD-10-CM | POA: Diagnosis not present

## 2020-10-09 DIAGNOSIS — I10 Essential (primary) hypertension: Secondary | ICD-10-CM | POA: Insufficient documentation

## 2020-10-09 DIAGNOSIS — R000 Tachycardia, unspecified: Secondary | ICD-10-CM | POA: Diagnosis not present

## 2020-10-09 DIAGNOSIS — R42 Dizziness and giddiness: Secondary | ICD-10-CM | POA: Diagnosis not present

## 2020-10-09 DIAGNOSIS — Z79899 Other long term (current) drug therapy: Secondary | ICD-10-CM | POA: Diagnosis not present

## 2020-10-09 LAB — CBC
HCT: 50.9 % (ref 39.0–52.0)
Hemoglobin: 16.9 g/dL (ref 13.0–17.0)
MCH: 31.8 pg (ref 26.0–34.0)
MCHC: 33.2 g/dL (ref 30.0–36.0)
MCV: 95.9 fL (ref 80.0–100.0)
Platelets: 215 10*3/uL (ref 150–400)
RBC: 5.31 MIL/uL (ref 4.22–5.81)
RDW: 13.8 % (ref 11.5–15.5)
WBC: 5.1 10*3/uL (ref 4.0–10.5)
nRBC: 0 % (ref 0.0–0.2)

## 2020-10-09 LAB — BASIC METABOLIC PANEL
Anion gap: 7 (ref 5–15)
BUN: 22 mg/dL (ref 8–23)
CO2: 28 mmol/L (ref 22–32)
Calcium: 9.2 mg/dL (ref 8.9–10.3)
Chloride: 104 mmol/L (ref 98–111)
Creatinine, Ser: 1.03 mg/dL (ref 0.61–1.24)
GFR, Estimated: >60 mL/min (ref >60)
Glucose, Bld: 141 mg/dL — ABNORMAL HIGH (ref 70–99)
Potassium: 4.2 mmol/L (ref 3.5–5.1)
Sodium: 139 mmol/L (ref 135–145)

## 2020-10-09 LAB — TSH: TSH: 1.567 u[IU]/mL (ref 0.350–4.500)

## 2020-10-09 LAB — MAGNESIUM: Magnesium: 1.9 mg/dL (ref 1.7–2.4)

## 2020-10-09 MED ORDER — FENTANYL CITRATE PF 50 MCG/ML IJ SOSY
50.0000 ug | PREFILLED_SYRINGE | Freq: Once | INTRAMUSCULAR | Status: DC
  Filled 2020-10-09: qty 1

## 2020-10-09 MED ORDER — APIXABAN (ELIQUIS) EDUCATION KIT FOR DVT/PE PATIENTS: PACK | Freq: Once | Status: AC

## 2020-10-09 MED ORDER — APIXABAN 2.5 MG PO TABS
5.0000 mg | ORAL_TABLET | Freq: Once | ORAL | Status: AC
  Administered 2020-10-09: 5 mg via ORAL
  Filled 2020-10-09: qty 2

## 2020-10-09 MED ORDER — APIXABAN 5 MG PO TABS: 5.0000 mg | ORAL_TABLET | Freq: Two times a day (BID) | ORAL | 0 refills | Status: DC

## 2020-10-09 MED ORDER — ETOMIDATE 2 MG/ML IV SOLN
INTRAVENOUS | Status: AC | PRN
Start: 2020-10-09 — End: 2020-10-09
  Administered 2020-10-09: 5 mg via INTRAVENOUS

## 2020-10-09 MED ORDER — METOPROLOL TARTRATE 5 MG/5ML IV SOLN
5.0000 mg | Freq: Once | INTRAVENOUS | Status: AC
  Administered 2020-10-09: 5 mg via INTRAVENOUS
  Filled 2020-10-09: qty 5

## 2020-10-09 MED ORDER — FENTANYL CITRATE (PF) 100 MCG/2ML IJ SOLN
INTRAMUSCULAR | Status: AC | PRN
  Administered 2020-10-09: 50 ug via INTRAVENOUS

## 2020-10-09 MED ORDER — ETOMIDATE 2 MG/ML IV SOLN
5.0000 mg | Freq: Once | INTRAVENOUS | Status: DC
  Filled 2020-10-09: qty 10

## 2020-10-09 NOTE — ED Triage Notes (Signed)
Approx ago felt flush, ck his BP, states BP was normal, but HR was 132/min. No chest pain, no SOB, no hx heart problems

## 2020-10-09 NOTE — Sedation Documentation (Signed)
ORAL TEMP 98.82F

## 2020-10-09 NOTE — Sedation Documentation (Signed)
SEDATED, UNABLE TO ASSESS PAIN AT THIS TIME. ED MD REMAINS AT BEDSIDE

## 2020-10-09 NOTE — Sedation Documentation (Signed)
DENIES ANY PAIN AT THIS TIME, RESPONDS TO LIGHT VERBAL STIMULI

## 2020-10-09 NOTE — ED Notes (Signed)
ED Provider at bedside. 

## 2020-10-09 NOTE — Sedation Documentation (Signed)
IV AT LEFT AC WNL

## 2020-10-09 NOTE — Sedation Documentation (Signed)
Alert and oriented x 4, MAE x 4, denies any pain at this time.

## 2020-10-09 NOTE — ED Notes (Signed)
EKG performed, Sinus Tach at 140's noted, given to ED MD

## 2020-10-09 NOTE — ED Provider Notes (Addendum)
[facility] Provider Note   CSN: [id number] Arrival date & time: 10/09/20  1058     History Chief Complaint  Patient presents with   Irregular Heart Beat    Kenneth Melendez is a 65 y.o. male.  HPI Patient presents with dizziness.  States began to feel bad around 45 minutes ago.  States he felt flushed.  States he had some back pain which is not unusual for him states he has a bulging disc.  Has been doing well last couple days.  Has not had episodes like this previously.  No fevers or chills.  No coughing.  No abdominal pain.  No swelling in his legs.  Does not smoke.  No weight loss.  No history of irregular heartbeat.    Past Medical History:  Diagnosis Date   Erectile dysfunction    Hemorrhoids    Hyperlipidemia    Hypertension    Migraine    followed by [facility]   Pre-diabetes    Urethral stricture     Patient Active Problem List   Diagnosis Date Noted   Typical atrial flutter ([place]) 10/13/2020   Secondary hypercoagulable state ([place]) 10/13/2020   Travel advice encounter 12/28/2017   Migraine without aura and without status migrainosus, not intractable 10/21/2014   Hyperlipidemia LDL goal <70 04/03/2009   Essential hypertension 04/03/2009    Past Surgical History:  Procedure Laterality Date   APPENDECTOMY  1970's   BUNIONECTOMY Right 2005  approx   COLONOSCOPY  04/20/2009   CYSTOSCOPY WITH URETHRAL DILATATION N/A 01/17/2016   Procedure: CYSTOSCOPY WITH URETHRAL  DILATATION;  Surgeon: Cleon Gustin, MD;  Location: [facility];  Service: Urology;  Laterality: N/A;   HEMORRHOIDECTOMY WITH HEMORRHOID BANDING  06/14/2010   INGUINAL HERNIA REPAIR Left 1996  approx   SHOULDER ARTHROSCOPY  01/02/2011   Procedure: ARTHROSCOPY SHOULDER;  Surgeon: Sharmon Revere;  Location: [facility];  Service: Orthopedics;  Laterality: Left;   SUPRAUMBILICAL HERNIA REPAIR W/ MESH  05/14/2009       Family History  Problem  Relation Age of Onset   Heart attack Father    Diabetes Mother    Heart attack Mother    Heart disease Mother    Hypotension Mother    Anesthesia problems Neg Hx    Malignant hyperthermia Neg Hx    Pseudochol deficiency Neg Hx     Social History   Tobacco Use   Smoking status: Never   Smokeless tobacco: Never  Substance Use Topics   Alcohol use: Yes    Alcohol/week: 0.0 standard drinks    Comment: social    Drug use: No    Home Medications Prior to Admission medications   Medication Sig Start Date End Date Taking? Authorizing Provider  apixaban (ELIQUIS) 5 MG TABS tablet Take 1 tablet (5 mg total) by mouth 2 (two) times daily. 10/09/20 11/08/20 Yes Davonna Belling, MD  acetaminophen (TYLENOL) 325 MG tablet Take 650 mg by mouth every 6 (six) hours as needed for mild pain or moderate pain.    [provider]  Apoaequorin (PREVAGEN PO) Take by mouth.    [provider]  Ascorbic Acid (VITAMIN C) 1000 MG tablet Take 1,000 mg by mouth daily.    [provider]  atorvastatin (LIPITOR) 10 MG tablet TAKE 1 TABLET BY MOUTH ONCE DAILY 01/29/20 01/28/21  Benito Mccreedy, MD  Butalbital-APAP-Caff-Cod 50-300-40-30 MG CAPS Take 1 capsule by mouth every 4 hours as needed for 5  days 05/03/20     Butalbital-APAP-Caffeine 50-300-40 MG CAPS as needed. 07/30/20   [provider]  Cinnamon 500 MG capsule Take 500 mg by mouth 2 (two) times daily.      [provider]  COVID-19 mRNA Vac-TriS, Pfizer, (PFIZER-BIONT COVID-19 VAC-TRIS) SUSP injection Inject into the muscle. 05/24/20   Carlyle Basques, MD  cyclobenzaprine (FLEXERIL) 10 MG tablet Take 10 mg by mouth in the morning and at bedtime.    [provider]  diclofenac (VOLTAREN) 75 MG EC tablet Take 75 mg by mouth daily.    [provider]  diclofenac (VOLTAREN) 75 MG EC tablet Take 1 tablet by mouth 2 (two) times daily. 08/26/20   [provider]  diltiazem (CARDIZEM) 30 MG  tablet Take 1 tablet by mouth every 4 hours as needed for heart rate >100 10/13/20   Fenton, Clint R, PA  gabapentin (NEURONTIN) 300 MG capsule Take 1 capsule (300 mg total) by mouth 3 (three) times daily 07/02/20     GEMTESA 75 MG TABS Take 1 tablet by mouth daily as needed. 10/08/20   [provider]  glucosamine-chondroitin 500-400 MG tablet Take 1 tablet by mouth in the morning and at bedtime.    [provider]  influenza vaccine adjuvanted (FLUAD) 0.5 ML injection TO BE INJECTED AS DIRECTED 12/18/19 12/17/20  Carlyle Basques, MD  losartan (COZAAR) 100 MG tablet Take 1 tablet by mouth once daily 09/23/20     metoprolol succinate (TOPROL-XL) 25 MG 24 hr tablet Take 1 tablet (25 mg total) by mouth daily. 10/15/20   Belva Crome, MD  Multiple Vitamin (MULITIVITAMIN WITH MINERALS) TABS Take 1 tablet by mouth daily.      [provider]  oxyCODONE-acetaminophen (PERCOCET) 10-325 MG tablet Take 1 tablet 3 times a day by oral route as needed for pain for 5 days. 10/20/20     SUMAtriptan (IMITREX) 100 MG tablet TAKE 1 TABLET BY MOUTH ONCE. IF HEADACHE RECURS MAY REPEAT AFTER 2 HOURS **MAX OF 2 TABLETS PER 24 HOURS** 04/30/20 04/30/21  Osei-Bonsu, Iona Beard, MD  tadalafil (CIALIS) 20 MG tablet Take 20 mg by mouth daily as needed for erectile dysfunction.    [provider]  tamsulosin (FLOMAX) 0.4 MG CAPS capsule Take 0.4 mg by mouth daily after breakfast.  07/13/15   [provider]    Allergies    Patient has no known allergies.  Review of Systems   Review of Systems  Constitutional:  Negative for appetite change.  Respiratory:  Negative for shortness of breath.   Cardiovascular:  Positive for palpitations. Negative for chest pain.  Gastrointestinal:  Negative for abdominal pain.  Genitourinary:  Negative for flank pain.  Musculoskeletal:  Negative for back pain.  Skin:  Negative for rash.  Neurological:  Positive for light-headedness.  Psychiatric/Behavioral:   Negative for confusion.    Physical Exam Updated Vital Signs BP 100/83 (BP Location: Right Arm)   Pulse (!) 51   Temp 98.5 F (36.9 C) (Oral)   Resp 17   Ht $R'6\' 4"'kj$  (1.93 m)   Wt 103.4 kg   SpO2 98%   BMI 27.75 kg/m   Physical Exam Vitals and nursing note reviewed.  HENT:     Head: Atraumatic.  Eyes:     Pupils: Pupils are equal, round, and reactive to light.  Cardiovascular:     Rate and Rhythm: Regular rhythm. Tachycardia present.  Pulmonary:     Breath sounds: No wheezing or rhonchi.  Abdominal:  Tenderness: There is no abdominal tenderness.  Musculoskeletal:        General: No tenderness.     Cervical back: Neck supple.  Skin:    General: Skin is warm.     Capillary Refill: Capillary refill takes less than 2 seconds.  Neurological:     Mental Status: He is alert and oriented to person, place, and time.    ED Results / Procedures / Treatments   Labs (all labs ordered are listed, but only abnormal results are displayed) Labs Reviewed  BASIC METABOLIC PANEL - Abnormal; Notable for the following components:      Result Value   Glucose, Bld 141 (*)    All other components within normal limits  MAGNESIUM  CBC  TSH    EKG EKG Interpretation  Date/Time:  Saturday October 09 2020 12:11:30 EDT Ventricular Rate:  50 PR Interval:  168 QRS Duration: 100 QT Interval:  421 QTC Calculation: 384 R Axis:   41 Text Interpretation: Sinus rhythm Probable left atrial enlargement Abnormal R-wave progression, early transition sinus replacing flutter on prior Confirmed by Aletta Edouard [phone]) on 10/10/2020 12:11:37 PM  Radiology No results found.  Procedures .Cardioversion  Date/Time: 10/09/2020 1:34 PM Performed by: Davonna Belling, MD Authorized by: Davonna Belling, MD   Consent:    Consent obtained:  Written   Consent given by:  Patient   Risks discussed:  Cutaneous burn, death and induced arrhythmia   Alternatives discussed:  No treatment, rate-control  medication, alternative treatment, anti-coagulation medication, delayed treatment and observation Pre-procedure details:    Cardioversion basis:  Elective   Rhythm:  Atrial flutter   Electrode placement:  Anterior-posterior Patient sedated: Yes. Refer to sedation procedure documentation for details of sedation.  Attempt one:    Cardioversion mode:  Synchronous   Waveform:  Biphasic   Shock (Joules):  150   Shock outcome:  Conversion to normal sinus rhythm Post-procedure details:    Patient status:  Awake   Patient tolerance of procedure:  Tolerated well, no immediate complications .Sedation  Date/Time: 10/09/2020 12:00 PM Performed by: Davonna Belling, MD Authorized by: Davonna Belling, MD   Consent:    Consent obtained:  Written   Consent given by:  Patient   Risks discussed:  Allergic reaction, prolonged hypoxia resulting in organ damage, dysrhythmia, inadequate sedation, nausea, vomiting and respiratory compromise necessitating ventilatory assistance and intubation   Alternatives discussed:  Analgesia without sedation Universal protocol:    Immediately prior to procedure, a time out was called: yes     Patient identity confirmed:  Arm band and verbally with patient Indications:    Procedure performed:  Cardioversion   Procedure necessitating sedation performed by:  Physician performing sedation Pre-sedation assessment:    Time since last food or drink:  3 hrs   ASA classification: class 3 - patient with severe systemic disease     Mouth opening:  3 or more finger widths   Thyromental distance:  3 finger widths   Mallampati score:  III - soft palate, base of uvula visible   Neck mobility: normal     Pre-sedation assessments completed and reviewed: airway patency, cardiovascular function, hydration status, mental status, pain level, respiratory function and temperature     Pre-sedation assessments completed and reviewed: pre-procedure nausea and vomiting status not reviewed    Immediate pre-procedure details:    Reassessment: Patient reassessed immediately prior to procedure     Reviewed: vital signs     Verified: bag valve mask available  Procedure details (see MAR for exact dosages):    Preoxygenation:  Nasal cannula   Sedation:  Etomidate   Intended level of sedation: deep   Analgesia:  Fentanyl   Intra-procedure monitoring:  Blood pressure monitoring, continuous capnometry, frequent LOC assessments, continuous pulse oximetry, frequent vital sign checks and cardiac monitor   Intra-procedure events: none     Total Provider sedation time (minutes):  10 Post-procedure details:    Post-sedation assessment completed:  10/09/2020 1:00 PM   Attendance: Constant attendance by certified staff until patient recovered     Recovery: Patient returned to pre-procedure baseline     Post-sedation assessments completed and reviewed: airway patency, cardiovascular function, hydration status, mental status, pain level, respiratory function and temperature     Post-sedation assessments completed and reviewed: post-procedure nausea and vomiting status not reviewed     Patient is stable for discharge or admission: yes     Procedure completion:  Tolerated well, no immediate complications   Medications Ordered in ED Medications  metoprolol tartrate (LOPRESSOR) injection 5 mg (5 mg Intravenous Given 10/09/20 1153)  fentaNYL (SUBLIMAZE) injection (50 mcg Intravenous Given 10/09/20 1203)  etomidate (AMIDATE) injection (5 mg Intravenous Given 10/09/20 1205)  apixaban (ELIQUIS) tablet 5 mg (5 mg Oral Given 10/09/20 1234)  apixaban (ELIQUIS) Education Kit for DVT/PE patients ( Does not apply Given 10/09/20 1234)    ED Course  I have reviewed the triage vital signs and the nursing notes.  Pertinent labs & imaging results that were available during my care of the patient were reviewed by me and considered in my medical decision making (see chart for details).    MDM Rules/Calculators/A&P                           Patient presents with palpitations lightheadedness.  Found to be new onset atrial flutter with RVR.  Appears to have acute onset of this morning.  No history of A. fib.  No other episodes of this that he is felt.  Has a baseline bradycardia.  No swelling in his legs.  Well-appearing.  Thought to be a good candidate for ED cardioversion.  Cardioverted back to sinus rhythm.  Feels better.  Given first dose of Eliquis.  Outpatient follow-up with A. fib clinic or Dr. Tamala Julian.  Started on Eliquis.  Already with bradycardia will not add other rate control rhythms such as metoprolol or Cardizem.  Discharge home.  CRITICAL CARE Performed by: Davonna Belling Total critical care time: 69minutes Critical care time was exclusive of separately billable procedures and treating other patients. Critical care was necessary to treat or prevent imminent or life-threatening deterioration. Critical care was time spent personally by me on the following activities: development of treatment plan with patient and/or surrogate as well as nursing, discussions with consultants, evaluation of patient's response to treatment, examination of patient, obtaining history from patient or surrogate, ordering and performing treatments and interventions, ordering and review of laboratory studies, ordering and review of radiographic studies, pulse oximetry and re-evaluation of patient's condition.  Final Clinical Impression(s) / ED Diagnoses Final diagnoses:  Atrial flutter with rapid ventricular response ([place])    Rx / DC Orders ED Discharge Orders          Ordered    apixaban (ELIQUIS) 5 MG TABS tablet  2 times daily        10/09/20 1310             Davonna Belling, MD  10/09/20 1337    Davonna Belling, MD 10/29/20 1444

## 2020-10-09 NOTE — Sedation Documentation (Signed)
ED MD at bedside to speak with pt and wife

## 2020-10-09 NOTE — Sedation Documentation (Signed)
PT IS TALKATIVE, ALERT AND ORIENTED

## 2020-10-09 NOTE — Sedation Documentation (Signed)
REPEAT 12 LEAD ECG PERFORMED

## 2020-10-09 NOTE — Discharge Instructions (Addendum)

## 2020-10-09 NOTE — Sedation Documentation (Signed)
SYNCHRONIZED CARDIOVERSION 150JOULES

## 2020-10-09 NOTE — Sedation Documentation (Signed)
TIME OUT performed by ED MD

## 2020-10-09 NOTE — Sedation Documentation (Signed)
PLACED ON ROOM AIR

## 2020-10-10 ENCOUNTER — Telehealth: Payer: Self-pay | Admitting: Home Health

## 2020-10-10 NOTE — Telephone Encounter (Signed)
Patient's wife Ms. Neva Seat called after-hours lines today to reports ER visit yesterday.  Called back at [phone], spoke directly to patient's wife, patient is nearby.  Wife states patient was not feeling well with heart palpitation and lightheadedness yesterday, blood pressure was okay, pulse was irregular, apple watch indicated patient had A. Fib.  Patient was brought to the ER, found to have new onset A. fib, was given metoprolol and cardioversion with subsequent conversion to sinus rhythm, he was also initiated Eliquis yesterday.  Wife states patient is feeling well today, had resolved symptoms, not having any problems with heart rate.  Wife is wondering if it is safe to keep the patient at home.  As there is no acute issue and patient is doing well currently, advised wife that is okay to keep the patient at home.  Advised the wife to call Dr. Katrinka Blazing office on Tuesday, 10/12/2020 to make a follow-up appointment given newly diagnosed A fib/flutter. Wife is agreeable.  All questions answered to satisfaction.

## 2020-10-12 ENCOUNTER — Telehealth: Payer: Self-pay | Admitting: Interventional Cardiology

## 2020-10-12 ENCOUNTER — Other Ambulatory Visit (HOSPITAL_BASED_OUTPATIENT_CLINIC_OR_DEPARTMENT_OTHER): Payer: Self-pay

## 2020-10-12 NOTE — Telephone Encounter (Signed)
Pt was seen in the hospital over the weekend for atrial flutter and had a cardioversion done... pt is needing to follow up w/ Dr. Katrinka Blazing, no availability w/ Dr. Katrinka Blazing or Pa please advise

## 2020-10-12 NOTE — Telephone Encounter (Signed)
Patient does not want to see DOD or anyone else. Patient only wants to see Dr. Katrinka Blazing. Informed patient that Dr. Katrinka Blazing has no available appointments until 11/01/20. Patient stated they would call Dr. Katrinka Blazing and hung up on me.

## 2020-10-13 ENCOUNTER — Telehealth: Payer: Self-pay | Admitting: Interventional Cardiology

## 2020-10-13 ENCOUNTER — Ambulatory Visit (HOSPITAL_COMMUNITY)
Admission: RE | Admit: 2020-10-13 | Discharge: 2020-10-13 | Disposition: A | Discharge status: Home / Self Care | Payer: Federal, State, Local not specified - PPO | Source: Ambulatory Visit | Attending: Physician Assistant | Admitting: Physician Assistant

## 2020-10-13 ENCOUNTER — Other Ambulatory Visit: Payer: Self-pay

## 2020-10-13 ENCOUNTER — Encounter (HOSPITAL_COMMUNITY): Payer: Self-pay | Admitting: Physician Assistant

## 2020-10-13 ENCOUNTER — Other Ambulatory Visit (HOSPITAL_BASED_OUTPATIENT_CLINIC_OR_DEPARTMENT_OTHER): Payer: Self-pay

## 2020-10-13 VITALS — BP 148/90 | HR 62 | Ht 76.0 in | Wt 223.2 lb

## 2020-10-13 DIAGNOSIS — E785 Hyperlipidemia, unspecified: Secondary | ICD-10-CM | POA: Insufficient documentation

## 2020-10-13 DIAGNOSIS — I10 Essential (primary) hypertension: Secondary | ICD-10-CM | POA: Diagnosis not present

## 2020-10-13 DIAGNOSIS — D6869 Other thrombophilia: Secondary | ICD-10-CM | POA: Insufficient documentation

## 2020-10-13 DIAGNOSIS — I4892 Unspecified atrial flutter: Secondary | ICD-10-CM | POA: Insufficient documentation

## 2020-10-13 DIAGNOSIS — I483 Typical atrial flutter: Secondary | ICD-10-CM | POA: Diagnosis not present

## 2020-10-13 DIAGNOSIS — Z79899 Other long term (current) drug therapy: Secondary | ICD-10-CM | POA: Diagnosis not present

## 2020-10-13 DIAGNOSIS — Z7901 Long term (current) use of anticoagulants: Secondary | ICD-10-CM | POA: Diagnosis not present

## 2020-10-13 MED ORDER — DILTIAZEM HCL 30 MG PO TABS
ORAL_TABLET | ORAL | 1 refills | Status: DC
Start: 2020-10-13 — End: 2021-01-26
  Filled 2020-10-13: qty 45, 8d supply, fill #0

## 2020-10-13 NOTE — Patient Instructions (Signed)
Cardizem 30mg -- take 1 tablet every 4 hours AS NEEDED for heart rate >100 as long as top number of blood pressure >100.  

## 2020-10-13 NOTE — Telephone Encounter (Signed)
Patient is calling to see what time his appointment with Dr. Katrinka Blazing is on Friday. I advised him that he was not scheduled to see Dr. Katrinka Blazing on Friday. Patient asking to speak directly with Dr. Michaelle Copas nurse who I advised is out of the office today. Advised will call back if she has a spot saved for him this Friday 9/9.

## 2020-10-13 NOTE — Telephone Encounter (Signed)
Pt is needing Dr. Lonn Georgia nurse to contact him in regards to an appt that he is supposed to have this Friday w/ him, pt would like to make sure it doesn't conflict w/ echo that is scheduled.. please advise

## 2020-10-13 NOTE — Telephone Encounter (Signed)
The patient was seen in the atrial fibrillation clinic today and will see EP.

## 2020-10-13 NOTE — Progress Notes (Signed)
Primary Care Physician: Filimon Plum, MD Primary Cardiologist: Dr Katrinka Blazing Primary Electrophysiologist: none Referring Physician: [facility]   Kenneth Melendez is a 65 y.o. male with a history of HTN, HLD, atrial flutter who presents for consultation in the [facility].  The patient was initially diagnosed with atrial flutter on 10/09/20 after presenting to the ED with symptoms of dizziness and an elevated heart rate on FitBit. ECG showed rapid atrial flutter and he underwent DCCV at that time. Patient was started on Eliquis for a CHADS2VASC score of 2. He has done well from a cardiac standpoint since that time. He does have considerable discomfort from a herniated disc in his lower back. He has been on steroids recently for this and has a back surgery scheduled later this month.   Today, he denies symptoms of palpitations, chest pain, shortness of breath, orthopnea, PND, lower extremity edema, dizziness, presyncope, syncope, snoring, daytime somnolence, bleeding, or neurologic sequela. The patient is tolerating medications without difficulties and is otherwise without complaint today.    Atrial Fibrillation Risk Factors:  he does not have symptoms or diagnosis of sleep apnea. he does not have a history of rheumatic fever.   he has a BMI of Body mass index is 27.17 kg/m.Marland Kitchen Filed Weights   10/13/20 1015  Weight: 101.2 kg    Family History  Problem Relation Age of Onset   Heart attack Father    Diabetes Mother    Heart attack Mother    Heart disease Mother    Hypotension Mother    Anesthesia problems Neg Hx    Malignant hyperthermia Neg Hx    Pseudochol deficiency Neg Hx      Atrial Fibrillation Management history:  Previous antiarrhythmic drugs: none Previous cardioversions: 10/09/20 Previous ablations: none CHADS2VASC score: 2 Anticoagulation history: Eliquis   Past Medical History:  Diagnosis Date   Erectile dysfunction     Hemorrhoids    Hyperlipidemia    Hypertension    Migraine    followed by [facility]   Pre-diabetes    Urethral stricture    Past Surgical History:  Procedure Laterality Date   APPENDECTOMY  1970's   BUNIONECTOMY Right 2005  approx   COLONOSCOPY  04/20/2009   CYSTOSCOPY WITH URETHRAL DILATATION N/A 01/17/2016   Procedure: CYSTOSCOPY WITH URETHRAL  DILATATION;  Surgeon: [name], MD;  Location: [facility];  Service: Urology;  Laterality: N/A;   HEMORRHOIDECTOMY WITH HEMORRHOID BANDING  06/14/2010   INGUINAL HERNIA REPAIR Left 1996  approx   SHOULDER ARTHROSCOPY  01/02/2011   Procedure: ARTHROSCOPY SHOULDER;  Surgeon: [name];  Location: [facility] OR;  Service: Orthopedics;  Laterality: Left;   SUPRAUMBILICAL HERNIA REPAIR W/ MESH  05/14/2009    Current Outpatient Medications  Medication Sig Dispense Refill   acetaminophen (TYLENOL) 325 MG tablet Take 650 mg by mouth every 6 (six) hours as needed for mild pain or moderate pain.     apixaban (ELIQUIS) 5 MG TABS tablet Take 1 tablet (5 mg total) by mouth 2 (two) times daily. 60 tablet 0   Apoaequorin (PREVAGEN PO) Take by mouth.     Ascorbic Acid (VITAMIN C) 1000 MG tablet Take 1,000 mg by mouth daily.     atorvastatin (LIPITOR) 10 MG tablet TAKE 1 TABLET BY MOUTH ONCE DAILY 90 tablet 3   Butalbital-APAP-Caff-Cod 50-300-40-30 MG CAPS Take 1 capsule by mouth every 4 hours as needed for 5 days 30 capsule 3  Cinnamon 500 MG capsule Take 500 mg by mouth 2 (two) times daily.       COVID-19 mRNA Vac-TriS, Pfizer, (PFIZER-BIONT COVID-19 VAC-TRIS) SUSP injection Inject into the muscle. 0.3 mL 0   cyclobenzaprine (FLEXERIL) 10 MG tablet Take 10 mg by mouth in the morning and at bedtime.     diclofenac (VOLTAREN) 75 MG EC tablet Take 75 mg by mouth daily.     gabapentin (NEURONTIN) 300 MG capsule Take 1 capsule (300 mg total) by mouth 3 (three) times daily 90 capsule 11   GEMTESA 75 MG TABS Take 1 tablet by  mouth daily as needed.     glucosamine-chondroitin 500-400 MG tablet Take 1 tablet by mouth in the morning and at bedtime.     influenza vaccine adjuvanted (FLUAD) 0.5 ML injection TO BE INJECTED AS DIRECTED .5 mL 0   losartan (COZAAR) 100 MG tablet Take 1 tablet by mouth once daily 90 tablet 1   Multiple Vitamin (MULITIVITAMIN WITH MINERALS) TABS Take 1 tablet by mouth daily.       oxyCODONE-acetaminophen (PERCOCET) 10-325 MG tablet Take 1 tablet by mouth 3 times daily as needed for pain for 5 days 15 tablet 0   SUMAtriptan (IMITREX) 100 MG tablet TAKE 1 TABLET BY MOUTH ONCE. IF HEADACHE RECURS MAY REPEAT AFTER 2 HOURS **MAX OF 2 TABLETS PER 24 HOURS** 6 tablet 5   tadalafil (CIALIS) 20 MG tablet Take 20 mg by mouth daily as needed for erectile dysfunction.     tamsulosin (FLOMAX) 0.4 MG CAPS capsule Take 0.4 mg by mouth daily after breakfast.   1   No current facility-administered medications for this encounter.    No Known Allergies  Social History   Socioeconomic History   Marital status: Married    Spouse name: Not on file   Number of children: Not on file   Years of education: Not on file   Highest education level: Not on file  Occupational History   Occupation: Retired   Tobacco Use   Smoking status: Never   Smokeless tobacco: Never  Substance and Sexual Activity   Alcohol use: Yes    Alcohol/week: 0.0 standard drinks    Comment: social    Drug use: No   Sexual activity: Not on file  Other Topics Concern   Not on file  Social History Narrative   Not on file   Social Determinants of Health   Financial Resource Strain: Not on file  Food Insecurity: Not on file  Transportation Needs: Not on file  Physical Activity: Not on file  Stress: Not on file  Social Connections: Not on file  Intimate Partner Violence: Not on file     ROS- All systems are reviewed and negative except as per the HPI above.  Physical Exam: Vitals:   10/13/20 1015  BP: (!) 148/90  Pulse:  62  Weight: 101.2 kg  Height: 6\' 4"  (1.93 m)    GEN- The patient is a well appearing male, alert and oriented x 3 today.   Head- normocephalic, atraumatic Eyes-  Sclera clear, conjunctiva pink Ears- hearing intact Oropharynx- clear Neck- supple  Lungs- Clear to ausculation bilaterally, normal work of breathing Heart- Regular rate and rhythm, no murmurs, rubs or gallops  GI- soft, NT, ND, + BS Extremities- no clubbing, cyanosis, or edema MS- no significant deformity or atrophy Skin- no rash or lesion Psych- euthymic mood, full affect Neuro- strength and sensation are intact  Wt Readings from Last 3 Encounters:  10/13/20 101.2 kg  10/09/20 103.4 kg  10/18/17 101.5 kg    EKG today demonstrates  SR Vent. rate 62 BPM PR interval 156 ms QRS duration 80 ms QT/QTcB 404/410 ms   Epic records are reviewed at length today  CHA2DS2-VASc Score = 2  The patient's score is based upon: CHF History: No HTN History: Yes Diabetes History: No Stroke History: No Vascular Disease History: No Age Score: 1 Gender Score: 0      ASSESSMENT AND PLAN: 1. Atrial flutter The patient's CHA2DS2-VASc score is 2, indicating a 2.2% annual risk of stroke.   General education about atrial flutter provided and questions answered. We also discussed his stroke risk and the risks and benefits of anticoagulation. We also discussed therapeutic options including AAD vs ablation vs watchful waiting. Patient and family agreeable to EP referral for flutter ablation consideration.  Continue Eliquis 5 mg BID with no missed doses for one month post DCCV (11/08/20). If his microdiscectomy cannot be completed on anticoagulation, this will need to be postponed until after 10/3. Start diltiazem 30 mg PRN q 4 hours for heart racing. Check echocardiogram   2. Secondary Hypercoagulable State (ICD10:  D68.69) The patient is at significant risk for stroke/thromboembolism based upon his CHA2DS2-VASc Score of 2.   Continue Apixaban (Eliquis).   3. HTN Mildly elevated today. ? Related to back pain. No changes today.   Follow up with EP for ablation consideration.    Jorja Loa PA-C [facility] [address] [place], [place] [address] [phone] 10/13/2020 10:21 AM

## 2020-10-14 ENCOUNTER — Other Ambulatory Visit (HOSPITAL_BASED_OUTPATIENT_CLINIC_OR_DEPARTMENT_OTHER): Payer: Self-pay

## 2020-10-14 NOTE — Telephone Encounter (Signed)
Lyn Records, MD  Julio Sicks, RN It appears he has been seen in the AF Clinic. I don't think he needs a Friday appointment with me.   Spoke with pt and wife and made them aware that Dr. Katrinka Blazing said pt did not need to come in Friday since he was seen in the AF clinic.  Wife adamant about pt coming in to see Dr. Katrinka Blazing tomorrow.  I explained to her that AF Clinic did everything that we would have done and next steps are the echo he is already scheduled to have tomorrow.  Advised, depending on what the echo shows, would depend on when we would get him into the office next.  Wife was very upset that I would not schedule pt to see Dr. Katrinka Blazing tomorrow.  Explained once again why he didn't need to come in this close to seeing the AF clinic and that the echo was the right thing to do next.  Wife states she is not happy about this and hung up on me.

## 2020-10-14 NOTE — Progress Notes (Signed)
Cardiology Office Note:    Date:  10/15/2020   ID:  [name], DOB 1955-07-28, MRN [id number]  PCP:  Stein Plum, MD  Cardiologist:  None   Referring MD: Alrick Plum, MD   Chief Complaint  Patient presents with   Atrial Fibrillation   Hypertension     History of Present Illness:    Kenneth Melendez is a 65 y.o. male with a hx of primary hypertension, snores but no diagnosis of sleep apnea (wife is a physician and is concerned that this may in fact be a problem), EP, Prediabetes, hyperlipidemia, and recent episode of atrial flutter treated with ER cardioversion protocol 10/09/2020.  Seen in the AF clinic by [facility] 10/13/2020.  He is doing fine now.  He has not had prior atrial fibrillation or atrial flutter.  The episode that occurred on 10/09/2020 caused him to feel diaphoretic and weak.  He has never had a prior such feeling.  It has not recurred since the cardioversion on 10/09/2020.  Today he is in significant pain.  His back is causing significant right lower extremity weakness and discomfort.  Past Medical History:  Diagnosis Date   Erectile dysfunction    Hemorrhoids    Hyperlipidemia    Hypertension    Migraine    followed by [facility]   Pre-diabetes    Urethral stricture     Past Surgical History:  Procedure Laterality Date   APPENDECTOMY  1970's   BUNIONECTOMY Right 2005  approx   COLONOSCOPY  04/20/2009   CYSTOSCOPY WITH URETHRAL DILATATION N/A 01/17/2016   Procedure: CYSTOSCOPY WITH URETHRAL  DILATATION;  Surgeon: [name], MD;  Location[facility];  Service: Urology;  Laterality: N/A;   HEMORRHOIDECTOMY WITH HEMORRHOID BANDING  06/14/2010   INGUINAL HERNIA REPAIR Left 1996  approx   SHOULDER ARTHROSCOPY  01/02/2011   Procedure: ARTHROSCOPY SHOULDER;  Surgeon: [name];  Location: [facility] OR;  Service: Orthopedics;  Laterality: Left;   SUPRAUMBILICAL HERNIA REPAIR W/ MESH  05/14/2009    Current  Medications: Current Meds  Medication Sig   acetaminophen (TYLENOL) 325 MG tablet Take 650 mg by mouth every 6 (six) hours as needed for mild pain or moderate pain.   apixaban (ELIQUIS) 5 MG TABS tablet Take 1 tablet (5 mg total) by mouth 2 (two) times daily.   Apoaequorin (PREVAGEN PO) Take by mouth.   Ascorbic Acid (VITAMIN C) 1000 MG tablet Take 1,000 mg by mouth daily.   atorvastatin (LIPITOR) 10 MG tablet TAKE 1 TABLET BY MOUTH ONCE DAILY   Butalbital-APAP-Caff-Cod 50-300-40-30 MG CAPS Take 1 capsule by mouth every 4 hours as needed for 5 days   Butalbital-APAP-Caffeine 50-300-40 MG CAPS as needed.   Cinnamon 500 MG capsule Take 500 mg by mouth 2 (two) times daily.     COVID-19 mRNA Vac-TriS, Pfizer, (PFIZER-BIONT COVID-19 VAC-TRIS) SUSP injection Inject into the muscle.   cyclobenzaprine (FLEXERIL) 10 MG tablet Take 10 mg by mouth in the morning and at bedtime.   diclofenac (VOLTAREN) 75 MG EC tablet Take 75 mg by mouth daily.   diclofenac (VOLTAREN) 75 MG EC tablet Take 1 tablet by mouth 2 (two) times daily.   diltiazem (CARDIZEM) 30 MG tablet Take 1 tablet by mouth every 4 hours as needed for heart rate >100   gabapentin (NEURONTIN) 300 MG capsule Take 1 capsule (300 mg total) by mouth 3 (three) times daily   GEMTESA 75 MG TABS Take 1 tablet by mouth  daily as needed.   glucosamine-chondroitin 500-400 MG tablet Take 1 tablet by mouth in the morning and at bedtime.   influenza vaccine adjuvanted (FLUAD) 0.5 ML injection TO BE INJECTED AS DIRECTED   losartan (COZAAR) 100 MG tablet Take 1 tablet by mouth once daily   Multiple Vitamin (MULITIVITAMIN WITH MINERALS) TABS Take 1 tablet by mouth daily.     oxyCODONE-acetaminophen (PERCOCET) 10-325 MG tablet Take 1 tablet by mouth 3 times daily as needed for pain for 5 days   SUMAtriptan (IMITREX) 100 MG tablet TAKE 1 TABLET BY MOUTH ONCE. IF HEADACHE RECURS MAY REPEAT AFTER 2 HOURS **MAX OF 2 TABLETS PER 24 HOURS**   tadalafil (CIALIS) 20 MG  tablet Take 20 mg by mouth daily as needed for erectile dysfunction.   tamsulosin (FLOMAX) 0.4 MG CAPS capsule Take 0.4 mg by mouth daily after breakfast.      Allergies:   Patient has no known allergies.   Social History   Socioeconomic History   Marital status: Married    Spouse name: Not on file   Number of children: Not on file   Years of education: Not on file   Highest education level: Not on file  Occupational History   Occupation: Retired   Tobacco Use   Smoking status: Never   Smokeless tobacco: Never  Substance and Sexual Activity   Alcohol use: Yes    Alcohol/week: 0.0 standard drinks    Comment: social    Drug use: No   Sexual activity: Not on file  Other Topics Concern   Not on file  Social History Narrative   Not on file   Social Determinants of Health   Financial Resource Strain: Not on file  Food Insecurity: Not on file  Transportation Needs: Not on file  Physical Activity: Not on file  Stress: Not on file  Social Connections: Not on file     Family History: The patient's family history includes Diabetes in his mother; Heart attack in his father and mother; Heart disease in his mother; Hypotension in his mother. There is no history of Anesthesia problems, Malignant hyperthermia, or Pseudochol deficiency.  ROS:   Please see the history of present illness.    Terrible back pain related to lumbar disc.  Needs to have surgery.  All other systems reviewed and are negative.  EKGs/Labs/Other Studies Reviewed:    The following studies were reviewed today:  Recent LABS: Noted above.  CARDIAC MONITOR 10/24/2020:   NSR PAC's and PVC's are rare. No atrial fib or sustained SVT   HEART RATE EPISODES Minimum HR: 34 BPM at 3:13:41 AM Maximum HR: 114 BPM at 10:26:09 AM Average HR: 59 BPM  EKG:  EKG on 10/09/2020 demonstrates atrial flutter with 2:1 AV conduction and VR 144 bpm. Follow up on 10/09/2020 demonstrates SB with biatrial abnormality. Not repeated  for today's study.  EKG today demonstrates normal sinus rhythm, normal PR interval, and no normal overall appearance.  No change compared with the postconversion EKG on 10/09/2020.  Recent Labs: 10/09/2020: BUN 22; Creatinine, Ser 1.03; Hemoglobin 16.9; Magnesium 1.9; Platelets 215; Potassium 4.2; Sodium 139; TSH 1.567  Recent Lipid Panel No results found for: CHOL, TRIG, HDL, CHOLHDL, VLDL, LDLCALC, LDLDIRECT  Physical Exam:    VS:  BP (!) 122/94   Pulse 80   Ht 6\' 4"  (1.93 m)   Wt 228 lb (103.4 kg)   SpO2 95%   BMI 27.75 kg/m     Wt Readings from Last 3  Encounters:  10/15/20 228 lb (103.4 kg)  10/13/20 223 lb 3.2 oz (101.2 kg)  10/09/20 228 lb (103.4 kg)     GEN: Slightly overweight. No acute distress HEENT: Normal NECK: No JVD. LYMPHATICS: No lymphadenopathy CARDIAC: No murmur. RRR S4 but no S3 gallop, or edema. VASCULAR:  Normal Pulses. No bruits. RESPIRATORY:  Clear to auscultation without rales, wheezing or rhonchi  ABDOMEN: Soft, non-tender, non-distended, No pulsatile mass, MUSCULOSKELETAL: No deformity  SKIN: Warm and dry NEUROLOGIC:  Alert and oriented x 3 PSYCHIATRIC:  Normal affect   ASSESSMENT:    1. Typical atrial flutter (HCC)   2. Secondary hypercoagulable state (HCC)   3. Essential hypertension   4. Hyperlipidemia LDL goal <70   5. Prediabetes   6. Degeneration of lumbar or lumbosacral intervertebral disc    PLAN:    In order of problems listed above:  Maintaining normal sinus rhythm.  Risk factors for episode include possible obstructive sleep apnea, pain related to his back, steroid Dosepak, and recent inability to sleep because of pain associated with his back.  30-day monitor will be performed to see if there is underlying burden of asymptomatic atrial arrhythmias that would require long-term anticoagulation.  2D echocardiogram will be done today and if heart is structurally normal we will discontinue the Eliquis on October 3.  If the monitor reveals  background arrhythmia, the anticoagulant will be resumed.  Metoprolol succinate 25 mg/day as prescribed.  Baseline heart rate will need to be monitored. No complications on Eliquis. Metoprolol succinate 25 mg/day as prescribed.  Blood pressure after adding this medication will need to be monitored. Continue low-dose statin therapy. Did not discuss Tentatively, he will be cleared for neurosurgical treatment of intervertebral disc herniation as soon as it can be arranged.  This recommendation is pending no significant abnormalities being found on echocardiography.  Will send this information to Dr. Rockne Menghini, at [facility] in [place].   Medication Adjustments/Labs and Tests Ordered: Current medicines are reviewed at length with the patient today.  Concerns regarding medicines are outlined above.  Orders Placed This Encounter  Procedures   EKG 12-Lead    No orders of the defined types were placed in this encounter.   There are no Patient Instructions on file for this visit.   Signed, Lesleigh Noe, MD  10/15/2020 9:18 AM    [facility]

## 2020-10-14 NOTE — Telephone Encounter (Signed)
Patient is calling back, again requesting to speak with Dr. Michaelle Copas nurse regarding an appointment for 10/15/20 with Dr. Katrinka Blazing. He states he wants to confirm it. I informed him that he has an echo at [facility] and nothing scheduled with Dr. Katrinka Blazing and he insists he just wants to speak with Dr. Michaelle Copas nurse.  Please return call when able.

## 2020-10-15 ENCOUNTER — Ambulatory Visit (HOSPITAL_COMMUNITY)
Admission: RE | Admit: 2020-10-15 | Discharge: 2020-10-15 | Disposition: A | Discharge status: Home / Self Care | Payer: Federal, State, Local not specified - PPO | Source: Ambulatory Visit | Attending: Physician Assistant | Admitting: Physician Assistant

## 2020-10-15 ENCOUNTER — Ambulatory Visit: Payer: Federal, State, Local not specified - PPO | Admitting: Interventional Cardiology

## 2020-10-15 ENCOUNTER — Other Ambulatory Visit: Payer: Self-pay

## 2020-10-15 ENCOUNTER — Encounter: Payer: Self-pay | Admitting: Interventional Cardiology

## 2020-10-15 ENCOUNTER — Encounter: Payer: Self-pay | Admitting: Radiology

## 2020-10-15 ENCOUNTER — Other Ambulatory Visit (HOSPITAL_BASED_OUTPATIENT_CLINIC_OR_DEPARTMENT_OTHER): Payer: Self-pay

## 2020-10-15 VITALS — BP 122/94 | HR 80 | Ht 76.0 in | Wt 228.0 lb

## 2020-10-15 DIAGNOSIS — R7303 Prediabetes: Secondary | ICD-10-CM

## 2020-10-15 DIAGNOSIS — I10 Essential (primary) hypertension: Secondary | ICD-10-CM | POA: Diagnosis not present

## 2020-10-15 DIAGNOSIS — D6869 Other thrombophilia: Secondary | ICD-10-CM | POA: Diagnosis not present

## 2020-10-15 DIAGNOSIS — I483 Typical atrial flutter: Secondary | ICD-10-CM | POA: Diagnosis not present

## 2020-10-15 DIAGNOSIS — E785 Hyperlipidemia, unspecified: Secondary | ICD-10-CM

## 2020-10-15 DIAGNOSIS — I700 Atherosclerosis of aorta: Secondary | ICD-10-CM | POA: Insufficient documentation

## 2020-10-15 DIAGNOSIS — M5137 Other intervertebral disc degeneration, lumbosacral region: Secondary | ICD-10-CM

## 2020-10-15 LAB — ECHOCARDIOGRAM COMPLETE
Height: 76 in
S' Lateral: 2.6 cm
Weight: 3648 oz

## 2020-10-15 MED ORDER — METOPROLOL SUCCINATE ER 25 MG PO TB24
25.0000 mg | ORAL_TABLET | Freq: Every day | ORAL | 3 refills | Status: DC
  Filled 2020-10-15: qty 90, 90d supply, fill #0

## 2020-10-15 NOTE — Progress Notes (Addendum)
Enrolled patient for a 30 day [employer] Event monitor to be mailed to patients home  Sensitive skin electrodes and bridge attachments requested

## 2020-10-15 NOTE — Patient Instructions (Signed)
Medication Instructions:  1) START Metoprolol Succinate 25mg  once daily  *If you need a refill on your cardiac medications before your next appointment, please call your pharmacy*   Lab Work: None If you have labs (blood work) drawn today and your tests are completely normal, you will receive your results only by: MyChart Message (if you have MyChart) OR A paper copy in the mail If you have any lab test that is abnormal or we need to change your treatment, we will call you to review the results.   Testing/Procedures: Your physician has recommended that you wear an event monitor. Event monitors are medical devices that record the heart's electrical activity. Doctors most often these monitors to diagnose arrhythmias. Arrhythmias are problems with the speed or rhythm of the heartbeat. The monitor is a small, portable device. You can wear one while you do your normal daily activities. This is usually used to diagnose what is causing palpitations/syncope (passing out).    Follow-Up: At [facility], you and your health needs are our priority.  As part of our continuing mission to provide you with exceptional heart care, we have created designated Provider Care Teams.  These Care Teams include your primary Cardiologist (physician) and Advanced Practice Providers (APPs -  Physician Assistants and Nurse Practitioners) who all work together to provide you with the care you need, when you need it.  We recommend signing up for the patient portal called "MyChart".  Sign up information is provided on this After Visit Summary.  MyChart is used to connect with patients for Virtual Visits (Telemedicine).  Patients are able to view lab/test results, encounter notes, upcoming appointments, etc.  Non-urgent messages can be sent to your provider as well.   To learn more about what you can do with MyChart, go to [facility].    Your next appointment:   3 month(s)  The format for your next  appointment:   In Person  Provider:   You may see [email or web address], MD or one of the following Advanced Practice Providers on your designated Care Team:   Verdis Prime, NP    Other Instructions  [employer] Cardiac Event Monitor Instructions Your physician has requested you wear your cardiac event monitor for 30 days. [employer] may call or text to confirm a shipping address. The monitor will be sent to a land address via UPS. [employer] will not ship a monitor to a PO BOX. It typically takes 3-5 days to receive your monitor after it has been enrolled. [employer] will assist with USPS tracking if your package is delayed. The telephone number for [employer] is [phone]. Once you have received your monitor, please review the enclosed instructions. Instruction tutorials can also be viewed under help and settings on the enclosed cell phone. Your monitor has already been registered assigning a specific monitor serial # to you.  Applying the monitor Remove cell phone from case and turn it on. The cell phone works as [phone] and needs to be within IT consultant of you at all times. The cell phone will need to be charged on a daily basis. We recommend you plug the cell phone into the enclosed charger at your bedside table every night.  Monitor batteries: You will receive two monitor batteries labelled #1 and #2. These are your recorders. Plug battery #2 onto the second connection on the enclosed charger. Keep one battery on the charger at all times. This will keep the monitor battery deactivated. It will also keep it fully charged  for when you need to switch your monitor batteries. A small light will be blinking on the battery emblem when it is charging. The light on the battery emblem will remain on when the battery is fully charged.  Open package of a Monitor strip. Insert battery #1 into black hood on strip and gently squeeze monitor battery onto connection as indicated in instruction  booklet. Set aside while preparing skin.  Choose location for your strip, vertical or horizontal, as indicated in the instruction booklet. Shave to remove all hair from location. There cannot be any lotions, oils, powders, or colognes on skin where monitor is to be applied. Wipe skin clean with enclosed Saline wipe. Dry skin completely.  Peel paper labeled #1 off the back of the Monitor strip exposing the adhesive. Place the monitor on the chest in the vertical or horizontal position shown in the instruction booklet. One arrow on the monitor strip must be pointing upward. Carefully remove paper labeled #2, attaching remainder of strip to your skin. Try not to create any folds or wrinkles in the strip as you apply it.  Firmly press and release the circle in the center of the monitor battery. You will hear a small beep. This is turning the monitor battery on. The heart emblem on the monitor battery will light up every 5 seconds if the monitor battery in turned on and connected to the patient securely. Do not push and hold the circle down as this turns the monitor battery off. The cell phone will locate the monitor battery. A screen will appear on the cell phone checking the connection of your monitor strip. This may read poor connection initially but change to good connection within the next minute. Once your monitor accepts the connection you will hear a series of 3 beeps followed by a climbing crescendo of beeps. A screen will appear on the cell phone showing the two monitor strip placement options. Touch the picture that demonstrates where you applied the monitor strip.  Your monitor strip and battery are waterproof. You are able to shower, bathe, or swim with the monitor on. They just ask you do not submerge deeper than 3 feet underwater. We recommend removing the monitor if you are swimming in a lake, river, or ocean.  Your monitor battery will need to be switched to a fully charged  monitor battery approximately once a week. The cell phone will alert you of an action which needs to be made.  On the cell phone, tap for details to reveal connection status, monitor battery status, and cell phone battery status. The green dots indicates your monitor is in good status. A red dot indicates there is something that needs your attention.  To record a symptom, click the circle on the monitor battery. In 30-60 seconds a list of symptoms will appear on the cell phone. Select your symptom and tap save. Your monitor will record a sustained or significant arrhythmia regardless of you clicking the button. Some patients do not feel the heart rhythm irregularities. [employer] will notify us of any serious or critical events.  Refer to instruction booklet for instructions on switching batteries, changing strips, the Do not disturb or Pause features, or any additional questions.  Call [employer] at [phone], to confirm your monitor is transmitting and record your baseline. They will answer any questions you may have regarding the monitor instructions at that time.  Returning the monitor to [employer] Place all equipment back into blue box. Peel off strip of paper  to expose adhesive and close box securely. There is a prepaid UPS shipping label on this box. Drop in a UPS drop box, or at a UPS facility like [employer]. You may also contact [employer] to arrange UPS to pick up monitor package at your home.

## 2020-10-15 NOTE — Progress Notes (Signed)
Echocardiogram 2D Echocardiogram has been performed.  Warren Lacy Jeson Camacho RDCS 10/15/2020, 3:41 PM

## 2020-10-18 ENCOUNTER — Telehealth: Payer: Self-pay | Admitting: Interventional Cardiology

## 2020-10-18 NOTE — Telephone Encounter (Signed)
Patient states he saw his echo results in mychart and would like to know what is the next step.

## 2020-10-19 ENCOUNTER — Other Ambulatory Visit (HOSPITAL_BASED_OUTPATIENT_CLINIC_OR_DEPARTMENT_OTHER): Payer: Self-pay

## 2020-10-19 NOTE — Telephone Encounter (Signed)
Ew Message:      Patient says he want Dr Katrinka Blazing to read his Echo results.He does does want to see Clint Fenton. Please have Dr Katrinka Blazing to call him today after he reads his Echo.Marland Kitchen

## 2020-10-20 ENCOUNTER — Other Ambulatory Visit (HOSPITAL_BASED_OUTPATIENT_CLINIC_OR_DEPARTMENT_OTHER): Payer: Self-pay

## 2020-10-20 MED ORDER — OXYCODONE-ACETAMINOPHEN 10-325 MG PO TABS
ORAL_TABLET | ORAL | 0 refills | Status: DC
  Filled 2020-10-20: qty 15, 5d supply, fill #0

## 2020-10-20 NOTE — Telephone Encounter (Signed)
   Pt is calling back to f/u, pt requested to someone give him a call, he said he is suffering

## 2020-10-20 NOTE — Telephone Encounter (Signed)
Dr. Smith spoke with pt 

## 2020-10-21 ENCOUNTER — Telehealth: Payer: Self-pay | Admitting: Interventional Cardiology

## 2020-10-21 NOTE — Telephone Encounter (Signed)
Appointment scheduled Monday, 10/25/20, 9:30 AM, to have cardiac event monitor applied at [facility] office.

## 2020-10-21 NOTE — Telephone Encounter (Signed)
New message:       Patient said he received his Monitor today and will need some help putting it on please.

## 2020-10-25 ENCOUNTER — Other Ambulatory Visit: Payer: Self-pay

## 2020-10-25 ENCOUNTER — Ambulatory Visit (INDEPENDENT_AMBULATORY_CARE_PROVIDER_SITE_OTHER): Payer: Federal, State, Local not specified - PPO

## 2020-10-25 ENCOUNTER — Other Ambulatory Visit (HOSPITAL_BASED_OUTPATIENT_CLINIC_OR_DEPARTMENT_OTHER): Payer: Self-pay

## 2020-10-25 DIAGNOSIS — I483 Typical atrial flutter: Secondary | ICD-10-CM

## 2020-10-27 ENCOUNTER — Other Ambulatory Visit (HOSPITAL_BASED_OUTPATIENT_CLINIC_OR_DEPARTMENT_OTHER): Payer: Self-pay

## 2020-10-28 ENCOUNTER — Telehealth: Payer: Self-pay | Admitting: Interventional Cardiology

## 2020-10-28 DIAGNOSIS — I10 Essential (primary) hypertension: Secondary | ICD-10-CM | POA: Diagnosis not present

## 2020-10-28 DIAGNOSIS — M549 Dorsalgia, unspecified: Secondary | ICD-10-CM | POA: Diagnosis not present

## 2020-10-28 DIAGNOSIS — Z01818 Encounter for other preprocedural examination: Secondary | ICD-10-CM | POA: Diagnosis not present

## 2020-10-28 DIAGNOSIS — Z79899 Other long term (current) drug therapy: Secondary | ICD-10-CM | POA: Diagnosis not present

## 2020-10-28 DIAGNOSIS — Z01812 Encounter for preprocedural laboratory examination: Secondary | ICD-10-CM | POA: Diagnosis not present

## 2020-10-28 DIAGNOSIS — M5416 Radiculopathy, lumbar region: Secondary | ICD-10-CM | POA: Diagnosis not present

## 2020-10-28 DIAGNOSIS — I4892 Unspecified atrial flutter: Secondary | ICD-10-CM | POA: Diagnosis not present

## 2020-10-28 NOTE — Telephone Encounter (Signed)
    Patient Name: Kenneth Melendez  DOB: [birth year] MRN: [id number]  Primary Cardiologist: Lesleigh Noe, MD  Chart reviewed as part of pre-operative protocol coverage. Per Dr. Michaelle Copas note 10/15/20 "Tentatively, he will be cleared for neurosurgical treatment of intervertebral disc herniation as soon as it can be arranged.  This recommendation is pending no significant abnormalities being found on echocardiography.  Will send this information to Dr. Rockne Menghini, at [facility] in [place]".  Echo with normal LVEF and no significant abnormality.   Dr. Katrinka Blazing, can patient okay for surgery or needs to wait until monitor resulted? Currently it states "Exam Ended". Please forward your response to [facility].   Thank you

## 2020-10-28 NOTE — Telephone Encounter (Signed)
   [facility] Pre-operative Risk Assessment    Request for surgical clearance:  What type of surgery is being performed?  Right L5 S1 Decompression with Microdiscectomy   When is this surgery scheduled?  11/09/20   What type of clearance is required (medical clearance vs. Pharmacy clearance to hold med vs. Both)?  No   Are there any medications that need to be held prior to surgery and how long? No   Practice name and name of physician performing surgery?  [facility]  Dr. Katherine Roan    What is your office phone number?  [phone]   7.   What is your office fax number? Per Junie Panning, they won't need anything faxed. Did not provide fax # [phone].   Anesthesia type (None, local, MAC, general) ?  General    Zara Council 10/28/2020, 12:57 PM

## 2020-10-29 ENCOUNTER — Other Ambulatory Visit (HOSPITAL_BASED_OUTPATIENT_CLINIC_OR_DEPARTMENT_OTHER): Payer: Self-pay

## 2020-11-01 ENCOUNTER — Other Ambulatory Visit (HOSPITAL_BASED_OUTPATIENT_CLINIC_OR_DEPARTMENT_OTHER): Payer: Self-pay

## 2020-11-01 MED ORDER — SUMATRIPTAN SUCCINATE 100 MG PO TABS
ORAL_TABLET | ORAL | 5 refills | Status: DC
  Filled 2020-11-01: qty 6, 3d supply, fill #0
  Filled 2020-11-16: qty 6, 3d supply, fill #1
  Filled 2020-12-06: qty 6, 3d supply, fill #2
  Filled 2020-12-27: qty 6, 3d supply, fill #3
  Filled 2021-01-14: qty 6, 3d supply, fill #4

## 2020-11-01 MED ORDER — OXYCODONE-ACETAMINOPHEN 10-325 MG PO TABS
ORAL_TABLET | ORAL | 0 refills | Status: DC
  Filled 2020-11-01: qty 15, 5d supply, fill #0

## 2020-11-01 MED ORDER — GABAPENTIN 300 MG PO CAPS
ORAL_CAPSULE | ORAL | 3 refills | Status: DC
  Filled 2020-11-01: qty 120, 30d supply, fill #0

## 2020-11-01 MED ORDER — CYCLOBENZAPRINE HCL 10 MG PO TABS
ORAL_TABLET | ORAL | 1 refills | Status: DC
  Filled 2020-11-01: qty 90, 30d supply, fill #0

## 2020-11-02 ENCOUNTER — Other Ambulatory Visit (HOSPITAL_BASED_OUTPATIENT_CLINIC_OR_DEPARTMENT_OTHER): Payer: Self-pay

## 2020-11-03 ENCOUNTER — Other Ambulatory Visit (HOSPITAL_BASED_OUTPATIENT_CLINIC_OR_DEPARTMENT_OTHER): Payer: Self-pay

## 2020-11-03 MED ORDER — APIXABAN 5 MG PO TABS
5.0000 mg | ORAL_TABLET | Freq: Two times a day (BID) | ORAL | 3 refills | Status: DC
  Filled 2020-11-03: qty 60, 30d supply, fill #0

## 2020-11-03 NOTE — Telephone Encounter (Signed)
Geradine Girt from [facility] calling to clarify the patient's surgical clearance. Phone: [phone]

## 2020-11-03 NOTE — Telephone Encounter (Signed)
    Patient Name: Kenneth Melendez  DOB: July 25, 1955 MRN: [id number]  Primary Cardiologist: Lesleigh Noe, MD  Chart reviewed as part of pre-operative protocol coverage.   Per Dr. Katrinka Blazing, patient is at acceptable risk for planned procedure without further testing.  Pharmacy, please comment on recommendations for holding eliquis (recently diagnosed atrial flutter) prior to upcoming spinal procedure.   Once recommendations are finalized, will fax response to [phone].  Beatriz Stallion, PA-C 11/03/2020, 1:28 PM

## 2020-11-03 NOTE — Telephone Encounter (Signed)
Patient with diagnosis of aflutter on Eliquis for anticoagulation.    Procedure: Right L5 S1 Decompression with Microdiscectomy  Date of procedure: 11/09/20  CHA2DS2-VASc Score = 2  This indicates a 2.2% annual risk of stroke. The patient's score is based upon: CHF History: 0 HTN History: 1 Diabetes History: 0 Stroke History: 0 Vascular Disease History: 0 Age Score: 1 Gender Score: 0   Underwent cardioversion on 10/09/20 and cannot stop anticoagulation for 4 weeks after. Eliquis refill sent in as pt was only provided 1 month rx at discharge with 0 refills and new rx hasn't been sent since then.  CrCl >130mL/min Platelet count 201K  Per office protocol, patient can hold Eliquis for 3 days prior to procedure. However, procedure date will need to be moved at least 1 day out. 4 weeks of uninterrupted anticoag post cardioversion is on 10/1. The earliest he could start holding Eliquis would be on 10/2, and 3 day hold is required prior to spinal procedures, so 10/5 would be earliest possible procedure date.

## 2020-11-03 NOTE — Telephone Encounter (Signed)
Cleared for surgery 

## 2020-11-04 ENCOUNTER — Other Ambulatory Visit (HOSPITAL_COMMUNITY): Payer: Federal, State, Local not specified - PPO

## 2020-11-04 NOTE — Telephone Encounter (Signed)
FAX # [phone] for [facility] Phone# [phone] per call I made to [facility]    I will fax notes to surgeon's office

## 2020-11-05 NOTE — Telephone Encounter (Addendum)
Call received from [facility] regarding holding anticoagulation for surgery next week.  I spoke with Dr. Katrinka Blazing. He is fine with holding anticoagulation tomorrow, 10/1, and proceeding with surgery as planned, on 11/09/20.   I tried to call Denny Peon, left a VM on her phone.   I will fax this to the number provided to [place].

## 2020-11-05 NOTE — Telephone Encounter (Signed)
Left message for Kenneth Melendez, per notes from [facility] [place], [facility] and Dr. Katrinka Blazing, pt's procedure does not need to be moved. Please call back if any questions and ask to s/w pre op team. I will fax these notes to [facility] as well. Fax # given to me yesterday is [phone].

## 2020-11-05 NOTE — Telephone Encounter (Signed)
Resume after surgery when safe based on Dr. Petra Kuba recommendation. Would continue Eliquis until monitor results are known.  Okay to try antacide for skin relief.

## 2020-11-05 NOTE — Telephone Encounter (Signed)
Erin calling back for a verbal clearance today.

## 2020-11-08 ENCOUNTER — Other Ambulatory Visit (HOSPITAL_BASED_OUTPATIENT_CLINIC_OR_DEPARTMENT_OTHER): Payer: Self-pay

## 2020-11-08 DIAGNOSIS — R413 Other amnesia: Secondary | ICD-10-CM | POA: Diagnosis not present

## 2020-11-08 MED ORDER — ACYCLOVIR 400 MG PO TABS
ORAL_TABLET | ORAL | 0 refills | Status: DC
  Filled 2020-11-08: qty 21, 7d supply, fill #0

## 2020-11-09 ENCOUNTER — Other Ambulatory Visit (HOSPITAL_BASED_OUTPATIENT_CLINIC_OR_DEPARTMENT_OTHER): Payer: Self-pay

## 2020-11-09 DIAGNOSIS — Z79899 Other long term (current) drug therapy: Secondary | ICD-10-CM | POA: Diagnosis not present

## 2020-11-09 DIAGNOSIS — M5416 Radiculopathy, lumbar region: Secondary | ICD-10-CM | POA: Diagnosis not present

## 2020-11-09 DIAGNOSIS — G629 Polyneuropathy, unspecified: Secondary | ICD-10-CM | POA: Diagnosis not present

## 2020-11-09 DIAGNOSIS — I4892 Unspecified atrial flutter: Secondary | ICD-10-CM | POA: Diagnosis not present

## 2020-11-09 DIAGNOSIS — M5116 Intervertebral disc disorders with radiculopathy, lumbar region: Secondary | ICD-10-CM | POA: Diagnosis not present

## 2020-11-09 DIAGNOSIS — M5117 Intervertebral disc disorders with radiculopathy, lumbosacral region: Secondary | ICD-10-CM | POA: Diagnosis not present

## 2020-11-09 DIAGNOSIS — I10 Essential (primary) hypertension: Secondary | ICD-10-CM | POA: Diagnosis not present

## 2020-11-09 DIAGNOSIS — Z7901 Long term (current) use of anticoagulants: Secondary | ICD-10-CM | POA: Diagnosis not present

## 2020-11-09 DIAGNOSIS — G4730 Sleep apnea, unspecified: Secondary | ICD-10-CM | POA: Diagnosis not present

## 2020-11-09 MED ORDER — CEPHALEXIN 500 MG PO CAPS
ORAL_CAPSULE | ORAL | 0 refills | Status: DC
  Filled 2020-11-09: qty 15, 5d supply, fill #0

## 2020-11-09 MED ORDER — OXYCODONE-ACETAMINOPHEN 5-325 MG PO TABS
ORAL_TABLET | ORAL | 0 refills | Status: DC
  Filled 2020-11-09: qty 28, 7d supply, fill #0

## 2020-11-09 MED ORDER — TIZANIDINE HCL 4 MG PO TABS
ORAL_TABLET | ORAL | 0 refills | Status: DC
  Filled 2020-11-09: qty 120, 30d supply, fill #0

## 2020-11-16 ENCOUNTER — Other Ambulatory Visit (HOSPITAL_BASED_OUTPATIENT_CLINIC_OR_DEPARTMENT_OTHER): Payer: Self-pay

## 2020-11-23 ENCOUNTER — Ambulatory Visit: Payer: Federal, State, Local not specified - PPO | Attending: Internal Medicine

## 2020-11-23 ENCOUNTER — Other Ambulatory Visit (HOSPITAL_BASED_OUTPATIENT_CLINIC_OR_DEPARTMENT_OTHER): Payer: Self-pay

## 2020-11-23 DIAGNOSIS — Z23 Encounter for immunization: Secondary | ICD-10-CM

## 2020-11-23 MED ORDER — INFLUENZA VAC A&B SA ADJ QUAD 0.5 ML IM PRSY
PREFILLED_SYRINGE | INTRAMUSCULAR | 0 refills | Status: DC
  Filled 2020-11-23: qty 0.5, 1d supply, fill #0

## 2020-11-23 NOTE — Progress Notes (Signed)
   Covid-19 Vaccination Clinic  Name:  Kenneth Melendez    MRN: [id number] DOB: March 02, 1955  11/23/2020  [name] was observed post Covid-19 immunization for 15 minutes without incident. He was provided with Vaccine Information Sheet and instruction to access the V-Safe system.   [name] was instructed to call 911 with any severe reactions post vaccine: Difficulty breathing  Swelling of face and throat  A fast heartbeat  A bad rash all over body  Dizziness and weakness

## 2020-11-25 ENCOUNTER — Other Ambulatory Visit (HOSPITAL_BASED_OUTPATIENT_CLINIC_OR_DEPARTMENT_OTHER): Payer: Self-pay

## 2020-11-25 DIAGNOSIS — I483 Typical atrial flutter: Secondary | ICD-10-CM

## 2020-11-25 DIAGNOSIS — I4891 Unspecified atrial fibrillation: Secondary | ICD-10-CM

## 2020-11-25 MED ORDER — BUTALBITAL-APAP-CAFFEINE 50-300-40 MG PO CAPS
ORAL_CAPSULE | ORAL | 3 refills | Status: DC
  Filled 2020-11-25: qty 60, 15d supply, fill #0
  Filled 2021-01-14: qty 60, 15d supply, fill #1

## 2020-11-25 MED ORDER — ONDANSETRON 4 MG PO TBDP
ORAL_TABLET | ORAL | 3 refills | Status: DC
  Filled 2020-11-25: qty 30, 10d supply, fill #0
  Filled 2021-01-26: qty 30, 10d supply, fill #1

## 2020-11-26 ENCOUNTER — Other Ambulatory Visit: Payer: Self-pay

## 2020-11-26 ENCOUNTER — Emergency Department (HOSPITAL_BASED_OUTPATIENT_CLINIC_OR_DEPARTMENT_OTHER)
Admission: EM | Admit: 2020-11-26 | Discharge: 2020-11-26 | Disposition: A | Discharge status: Home / Self Care | Payer: Federal, State, Local not specified - PPO | Attending: Emergency Medicine | Admitting: Emergency Medicine

## 2020-11-26 ENCOUNTER — Emergency Department (HOSPITAL_BASED_OUTPATIENT_CLINIC_OR_DEPARTMENT_OTHER): Payer: Federal, State, Local not specified - PPO

## 2020-11-26 ENCOUNTER — Encounter (HOSPITAL_BASED_OUTPATIENT_CLINIC_OR_DEPARTMENT_OTHER): Payer: Self-pay | Admitting: Emergency Medicine

## 2020-11-26 ENCOUNTER — Other Ambulatory Visit (HOSPITAL_BASED_OUTPATIENT_CLINIC_OR_DEPARTMENT_OTHER): Payer: Self-pay

## 2020-11-26 DIAGNOSIS — I10 Essential (primary) hypertension: Secondary | ICD-10-CM | POA: Insufficient documentation

## 2020-11-26 DIAGNOSIS — R079 Chest pain, unspecified: Secondary | ICD-10-CM | POA: Diagnosis not present

## 2020-11-26 DIAGNOSIS — Z79899 Other long term (current) drug therapy: Secondary | ICD-10-CM | POA: Insufficient documentation

## 2020-11-26 DIAGNOSIS — R0789 Other chest pain: Secondary | ICD-10-CM | POA: Diagnosis not present

## 2020-11-26 DIAGNOSIS — Z7901 Long term (current) use of anticoagulants: Secondary | ICD-10-CM | POA: Diagnosis not present

## 2020-11-26 LAB — BASIC METABOLIC PANEL
Anion gap: 6 (ref 5–15)
BUN: 20 mg/dL (ref 8–23)
CO2: 26 mmol/L (ref 22–32)
Calcium: 8.9 mg/dL (ref 8.9–10.3)
Chloride: 109 mmol/L (ref 98–111)
Creatinine, Ser: 0.91 mg/dL (ref 0.61–1.24)
GFR, Estimated: >60 mL/min (ref >60)
Glucose, Bld: 124 mg/dL — ABNORMAL HIGH (ref 70–99)
Potassium: 4 mmol/L (ref 3.5–5.1)
Sodium: 141 mmol/L (ref 135–145)

## 2020-11-26 LAB — TROPONIN I (HIGH SENSITIVITY)
Troponin I (High Sensitivity): 2 ng/L (ref <18)
Troponin I (High Sensitivity): <2 ng/L (ref <18)

## 2020-11-26 LAB — CBC
HCT: 42.5 % (ref 39.0–52.0)
Hemoglobin: 13.8 g/dL (ref 13.0–17.0)
MCH: 31.4 pg (ref 26.0–34.0)
MCHC: 32.5 g/dL (ref 30.0–36.0)
MCV: 96.6 fL (ref 80.0–100.0)
Platelets: 243 10*3/uL (ref 150–400)
RBC: 4.4 MIL/uL (ref 4.22–5.81)
RDW: 13.1 % (ref 11.5–15.5)
WBC: 4.9 10*3/uL (ref 4.0–10.5)
nRBC: 0 % (ref 0.0–0.2)

## 2020-11-26 LAB — D-DIMER, QUANTITATIVE: D-Dimer, Quant: 1.02 ug/mL-FEU — ABNORMAL HIGH (ref 0.00–0.50)

## 2020-11-26 MED ORDER — SODIUM CHLORIDE 0.9 % IV BOLUS
1000.0000 mL | Freq: Once | INTRAVENOUS | Status: AC
  Administered 2020-11-26: 1000 mL via INTRAVENOUS

## 2020-11-26 MED ORDER — ASPIRIN 81 MG PO CHEW
324.0000 mg | CHEWABLE_TABLET | Freq: Once | ORAL | Status: AC
  Administered 2020-11-26: 324 mg via ORAL
  Filled 2020-11-26: qty 4

## 2020-11-26 MED ORDER — IOHEXOL 350 MG/ML SOLN
100.0000 mL | Freq: Once | INTRAVENOUS | Status: AC | PRN
  Administered 2020-11-26: 100 mL via INTRAVENOUS

## 2020-11-26 NOTE — ED Provider Notes (Signed)
[facility] EMERGENCY DEPARTMENT Provider Note   CSN: [id number] Arrival date & time: 11/26/20  4098     History Chief Complaint  Patient presents with   Chest Pain    Kenneth Melendez is a 65 y.o. male.  This is a 65 y.o. male with significant medical history as below, including HLD, HTN, prior a. Flutter s/p cardioversion who presents to the ED with complaint of chest pain.   Location:  right side chest wall, radiation to mid sternum Duration:  4-5 minutes Onset:  sudden Timing:  constant Description:  sharp, stabbing, aching Severity:  mild Exacerbating/Alleviating Factors:  worse with deep inspiration Associated Symptoms:  none identified Pertinent Negatives:  no fevers chills, n/v/diarrhea, no light headedness or HA, no numbness or tingling Context: pt reports he was shaving this morning and experienced sharp pain to his right side, resolved after a few minutes. He experienced this last night as well. Not a/w exertion. Currently asymptomatic. He follows with cardiology s/p cardioversion, no longer on eliquis.    The history is provided by the patient and the spouse. No language interpreter was used.  Chest Pain Pain location:  R chest Pain quality: aching   Pain radiates to:  Precordial region Pain severity:  Mild Onset quality:  Sudden Associated symptoms: no abdominal pain, no cough, no dysphagia, no fever, no headache, no nausea, no palpitations, no shortness of breath and no vomiting    HPI: A 65 year old patient with a history of hypertension and hypercholesterolemia presents for evaluation of chest pain. Initial onset of pain was approximately 1-3 hours ago. The patient's chest pain is well-localized, is sharp and is not worse with exertion. The patient's chest pain is not middle- or left-sided, is not described as heaviness/pressure/tightness and does not radiate to the arms/jaw/neck. The patient does not complain of nausea and denies diaphoresis. The  patient has a family history of coronary artery disease in a first-degree relative with onset less than age 73. The patient has no history of stroke, has no history of peripheral artery disease, has not smoked in the past 90 days, denies any history of treated diabetes and does not have an elevated BMI (>=30).   Past Medical History:  Diagnosis Date   Erectile dysfunction    Hemorrhoids    Hyperlipidemia    Hypertension    Migraine    followed by [facility]   Pre-diabetes    Urethral stricture     Patient Active Problem List   Diagnosis Date Noted   Typical atrial flutter (HCC) 10/13/2020   Secondary hypercoagulable state (HCC) 10/13/2020   Travel advice encounter 12/28/2017   Migraine without aura and without status migrainosus, not intractable 10/21/2014   Hyperlipidemia LDL goal <70 04/03/2009   Essential hypertension 04/03/2009    Past Surgical History:  Procedure Laterality Date   APPENDECTOMY  1970's   BUNIONECTOMY Right 2005  approx   COLONOSCOPY  04/20/2009   CYSTOSCOPY WITH URETHRAL DILATATION N/A 01/17/2016   Procedure: CYSTOSCOPY WITH URETHRAL  DILATATION;  Surgeon: [name], MD;  Location: [facility];  Service: Urology;  Laterality: N/A;   HEMORRHOIDECTOMY WITH HEMORRHOID BANDING  06/14/2010   INGUINAL HERNIA REPAIR Left 1996  approx   SHOULDER ARTHROSCOPY  01/02/2011   Procedure: ARTHROSCOPY SHOULDER;  Surgeon: [name];  Location: [facility] OR;  Service: Orthopedics;  Laterality: Left;   SUPRAUMBILICAL HERNIA REPAIR W/ MESH  05/14/2009       Family History  Problem Relation  Age of Onset   Heart attack Father    Diabetes Mother    Heart attack Mother    Heart disease Mother    Hypotension Mother    Anesthesia problems Neg Hx    Malignant hyperthermia Neg Hx    Pseudochol deficiency Neg Hx     Social History   Tobacco Use   Smoking status: Never   Smokeless tobacco: Never  Substance Use Topics   Alcohol use: Yes     Alcohol/week: 0.0 standard drinks    Comment: social    Drug use: No    Home Medications Prior to Admission medications   Medication Sig Start Date End Date Taking? Authorizing Provider  acetaminophen (TYLENOL) 325 MG tablet Take 650 mg by mouth every 6 (six) hours as needed for mild pain or moderate pain.    [provider]  acyclovir (ZOVIRAX) 400 MG tablet Take 1 tablet by mouth 3 times a day for 7 days 11/08/20     apixaban (ELIQUIS) 5 MG TABS tablet Take 1 tablet (5 mg total) by mouth 2 (two) times daily. 11/03/20   Lyn Records, MD  Apoaequorin (PREVAGEN PO) Take by mouth.    [provider]  Ascorbic Acid (VITAMIN C) 1000 MG tablet Take 1,000 mg by mouth daily.    [provider]  atorvastatin (LIPITOR) 10 MG tablet TAKE 1 TABLET BY MOUTH ONCE DAILY 01/29/20 01/28/21  Raheen Plum, MD  Butalbital-APAP-Caff-Cod 50-300-40-30 MG CAPS Take 1 capsule by mouth every 4 hours as needed for 5 days 05/03/20     Butalbital-APAP-Caffeine (FIORICET) 50-300-40 MG CAPS Take 1 capsule by mouth every 6 hours as needed 11/25/20     Butalbital-APAP-Caffeine 50-300-40 MG CAPS as needed. 07/30/20   [provider]  cephALEXin (KEFLEX) 500 MG capsule Take one capsule (500 mg dose) by mouth 3 (three) times a day for 5 days. 11/09/20     Cinnamon 500 MG capsule Take 500 mg by mouth 2 (two) times daily.      [provider]  COVID-19 mRNA Vac-TriS, Pfizer, (PFIZER-BIONT COVID-19 VAC-TRIS) SUSP injection Inject into the muscle. 05/24/20   Judyann Munson, MD  cyclobenzaprine (FLEXERIL) 10 MG tablet Take 10 mg by mouth in the morning and at bedtime.    [provider]  cyclobenzaprine (FLEXERIL) 10 MG tablet Take 1 tablet by mouth 3 times a day as needed 11/01/20     diclofenac (VOLTAREN) 75 MG EC tablet Take 75 mg by mouth daily.    [provider]  diclofenac (VOLTAREN) 75 MG EC tablet Take 1 tablet by mouth 2 (two) times daily. 08/26/20    [provider]  diltiazem (CARDIZEM) 30 MG tablet Take 1 tablet by mouth every 4 hours as needed for heart rate >100 10/13/20   Fenton, Clint R, PA  gabapentin (NEURONTIN) 300 MG capsule Take 1 capsule (300 mg total) by mouth 3 (three) times daily 07/02/20     gabapentin (NEURONTIN) 300 MG capsule Take 1 capsule by mouth 4 times a day as needed 11/01/20     GEMTESA 75 MG TABS Take 1 tablet by mouth daily as needed. 10/08/20   [provider]  glucosamine-chondroitin 500-400 MG tablet Take 1 tablet by mouth in the morning and at bedtime.    [provider]  influenza vaccine adjuvanted (FLUAD) 0.5 ML injection TO BE INJECTED AS DIRECTED 12/18/19 12/17/20  Judyann Munson, MD  influenza vaccine adjuvanted (FLUAD) 0.5 ML injection Inject into the muscle. 11/23/20  Judyann Munson, MD  losartan (COZAAR) 100 MG tablet Take 1 tablet by mouth once daily 09/23/20     metoprolol succinate (TOPROL-XL) 25 MG 24 hr tablet Take 1 tablet (25 mg total) by mouth daily. 10/15/20   Lyn Records, MD  Multiple Vitamin (MULITIVITAMIN WITH MINERALS) TABS Take 1 tablet by mouth daily.      [provider]  ondansetron (ZOFRAN-ODT) 4 MG disintegrating tablet Take 1 tablet by mouth 1 - 3 times daily 11/25/20     oxyCODONE-acetaminophen (PERCOCET) 10-325 MG tablet Take 1 tablet by mouth 3 times a day as needed for pain for 5 days. 11/01/20     oxyCODONE-acetaminophen (PERCOCET/ROXICET) 5-325 MG tablet Take one tablet by mouth every 6 (six) hours as needed for Pain for up to 7 days. 11/09/20     SUMAtriptan (IMITREX) 100 MG tablet TAKE 1 TABLET BY MOUTH ONCE. IF HEADACHE RECURS MAY REPEAT AFTER 2 HOURS **MAX OF 2 TABLETS PER 24 HOURS** 04/30/20 04/30/21  Osei-Bonsu, Greggory Stallion, MD  SUMAtriptan (IMITREX) 100 MG tablet Take 1 tablet by mouth as needed for headache, may repeat after 2 hours if headache recurs, no more than 2 tablets in 24 hrs 11/01/20     tadalafil (CIALIS) 20 MG tablet Take 20 mg by mouth  daily as needed for erectile dysfunction.    [provider]  tamsulosin (FLOMAX) 0.4 MG CAPS capsule Take 0.4 mg by mouth daily after breakfast.  07/13/15   [provider]  tiZANidine (ZANAFLEX) 4 MG tablet Take one tablet (4 mg dose) by mouth every 6 (six) hours as needed for up to 30 days. Do not take with Flexeril 11/09/20       Allergies    Patient has no known allergies.  Review of Systems   Review of Systems  Constitutional:  Negative for chills and fever.  HENT:  Negative for facial swelling and trouble swallowing.   Eyes:  Negative for photophobia and visual disturbance.  Respiratory:  Negative for cough and shortness of breath.   Cardiovascular:  Positive for chest pain. Negative for palpitations.  Gastrointestinal:  Negative for abdominal pain, nausea and vomiting.  Endocrine: Negative for polydipsia and polyuria.  Genitourinary:  Negative for difficulty urinating and hematuria.  Musculoskeletal:  Negative for gait problem and joint swelling.  Skin:  Negative for pallor and rash.  Neurological:  Negative for syncope and headaches.  Psychiatric/Behavioral:  Negative for agitation and confusion.    Physical Exam Updated Vital Signs BP (!) 144/75   Pulse (!) 55   Temp 98.2 F (36.8 C) (Oral)   Resp 14   Ht 6\' 4"  (1.93 m)   Wt 99.3 kg   SpO2 98%   BMI 26.66 kg/m   Physical Exam Vitals and nursing note reviewed.  Constitutional:      General: He is not in acute distress.    Appearance: Normal appearance. He is well-developed. He is not ill-appearing, toxic-appearing or diaphoretic.  HENT:     Head: Normocephalic and atraumatic.     Right Ear: External ear normal.     Left Ear: External ear normal.     Mouth/Throat:     Mouth: Mucous membranes are moist.  Eyes:     General: No scleral icterus. Cardiovascular:     Rate and Rhythm: Normal rate and regular rhythm.     Pulses: Normal pulses.     Heart sounds: Normal heart sounds. No murmur  heard. Pulmonary:     Effort: Pulmonary effort  is normal. No respiratory distress.     Breath sounds: Normal breath sounds.  Abdominal:     General: Abdomen is flat.     Palpations: Abdomen is soft.     Tenderness: There is no abdominal tenderness.  Musculoskeletal:        General: Normal range of motion.     Cervical back: Normal range of motion.     Right lower leg: No edema.     Left lower leg: No edema.  Skin:    General: Skin is warm and dry.     Capillary Refill: Capillary refill takes less than 2 seconds.  Neurological:     Mental Status: He is alert and oriented to person, place, and time.     GCS: GCS eye subscore is 4. GCS verbal subscore is 5. GCS motor subscore is 6.  Psychiatric:        Mood and Affect: Mood normal.        Behavior: Behavior normal.    ED Results / Procedures / Treatments   Labs (all labs ordered are listed, but only abnormal results are displayed) Labs Reviewed  BASIC METABOLIC PANEL - Abnormal; Notable for the following components:      Result Value   Glucose, Bld 124 (*)    All other components within normal limits  D-DIMER, QUANTITATIVE (NOT AT [facility]) - Abnormal; Notable for the following components:   D-Dimer, Quant 1.02 (*)    All other components within normal limits  CBC  TROPONIN I (HIGH SENSITIVITY)  TROPONIN I (HIGH SENSITIVITY)    EKG EKG Interpretation  Date/Time:  Friday November 26 2020 07:46:09 EDT Ventricular Rate:  54 PR Interval:  164 QRS Duration: 88 QT Interval:  422 QTC Calculation: 400 R Axis:   50 Text Interpretation: Sinus rhythm Atrial premature complex  Similar to prior tracing Confirmed by Tanda Rockers ([id number]) on 11/26/2020 7:57:11 AM  Radiology DG Chest 2 View  Result Date: 11/26/2020 CLINICAL DATA:  65 year old male with chest pain for 1 day. EXAM: CHEST - 2 VIEW COMPARISON:  Portable chest 10/09/2020 and earlier. FINDINGS: Lung volumes and mediastinal contours remain normal. Visualized tracheal air column  is within normal limits. Lungs appear stable and clear. No pneumothorax or pleural effusion. No acute osseous abnormality identified. Negative visible bowel gas pattern. IMPRESSION: Negative.  No acute cardiopulmonary abnormality. Electronically Signed   By: Odessa Fleming M.D.   On: 11/26/2020 08:17   CT Angio Chest PE W and/or Wo Contrast  Result Date: 11/26/2020 CLINICAL DATA:  PE suspected, low/intermediate prob, positive D-dimer. Chest pain for 1 day. EXAM: CT ANGIOGRAPHY CHEST WITH CONTRAST TECHNIQUE: Multidetector CT imaging of the chest was performed using the standard protocol during bolus administration of intravenous contrast. Multiplanar CT image reconstructions and MIPs were obtained to evaluate the vascular anatomy. CONTRAST:  OMNIPAQUE IOHEXOL 350 MG/ML SOLN COMPARISON:  Chest radiographs 11/26/2020.  Chest CTA 02/05/2011. FINDINGS: Cardiovascular: Pulmonary arterial opacification is adequate without evidence of emboli. The thoracic aorta is normal in caliber. The heart is normal in size. There is no pericardial effusion. Mediastinum/Nodes: No enlarged axillary or mediastinal lymph nodes. Chronic borderline to mildly enlarged right hilar lymph nodes, similar to the prior CT. Unremarkable thyroid and esophagus. Lungs/Pleura: No pleural effusion or pneumothorax. No consolidation or mass. Upper Abdomen: 1.5 cm hypodensity in the upper pole of the left kidney compatible with a cyst. Musculoskeletal: No acute osseous abnormality or suspicious osseous lesion. Review of the MIP images confirms the above  findings. IMPRESSION: No evidence of pulmonary emboli or other acute abnormality in the chest. Electronically Signed   By: Sebastian Ache M.D.   On: 11/26/2020 09:53    Procedures Procedures   Medications Ordered in ED Medications  aspirin chewable tablet 324 mg (324 mg Oral Given 11/26/20 0818)  sodium chloride 0.9 % bolus 1,000 mL (1,000 mLs Intravenous New Bag/Given 11/26/20 0932)  iohexol  (OMNIPAQUE) 350 MG/ML injection 100 mL (100 mLs Intravenous Contrast Given 11/26/20 8295)    ED Course  I have reviewed the triage vital signs and the nursing notes.  Pertinent labs & imaging results that were available during my care of the patient were reviewed by me and considered in my medical decision making (see chart for details).    MDM Rules/Calculators/A&P HEAR Score: 4                         CC: cp  This patient complains of cp; this involves an extensive number of treatment options and is a complaint that carries with it a high risk of complications and morbidity. Vital signs were reviewed. Serious etiologies considered.  Record review:   Previous records obtained and reviewed   Prior ECHO 9/22, EF 55-60%, trivial mitral regurg.   Additional history obtained from spouse  Work up as above, notable for:   Low risk Well's score, obtain D-dimer, this was elevated, CTPE obtained which was negative for acute PE  Labs & imaging results that were available during my care of the patient were reviewed by me and considered in my medical decision making.   I ordered imaging studies which included CXR, CTPE and I independently visualized and interpreted imaging which showed no acute process  Management: ASA given  Reassessment:  Pt resting comfortably, he has no current pain. Discussed imaging and lab findings with the patient. Favor atypical chest pain as etiology of symptoms. Advised patient to f/u with his cardiologist regarding chest pain, risk stratification give HEART score of 4. He had echo last month which was stable.   The patient improved significantly and was discharged in stable condition. Detailed discussions were had with the patient regarding current findings, and need for close f/u with PCP or on call doctor. The patient has been instructed to return immediately if the symptoms worsen in any way for re-evaluation. Patient verbalized understanding and is in  agreement with current care plan. All questions answered prior to discharge.            This chart was dictated using voice recognition software.  Despite best efforts to proofread,  errors can occur which can change the documentation meaning.  Final Clinical Impression(s) / ED Diagnoses Final diagnoses:  Nonspecific chest pain    Rx / DC Orders ED Discharge Orders     None        Sloan Leiter, DO 11/26/20 1125

## 2020-11-26 NOTE — ED Triage Notes (Signed)
Sharp chest pain x 1 day. Hx a.Flutter and had cardioversion last month. Was on elaquis x 1 month. Denies shortness of breath nor cough.

## 2020-11-29 ENCOUNTER — Other Ambulatory Visit (HOSPITAL_BASED_OUTPATIENT_CLINIC_OR_DEPARTMENT_OTHER): Payer: Self-pay

## 2020-11-30 ENCOUNTER — Telehealth: Payer: Self-pay | Admitting: *Deleted

## 2020-11-30 DIAGNOSIS — R413 Other amnesia: Secondary | ICD-10-CM | POA: Diagnosis not present

## 2020-11-30 NOTE — Telephone Encounter (Signed)
-----   Message from Alois Cliche, RN sent at 11/29/2020  8:40 AM EDT ----- Regarding: sleep study Please precert and schedule pt.  Thank You,  Carlyle Basques

## 2020-12-06 ENCOUNTER — Encounter: Payer: Self-pay | Admitting: *Deleted

## 2020-12-06 ENCOUNTER — Other Ambulatory Visit (HOSPITAL_BASED_OUTPATIENT_CLINIC_OR_DEPARTMENT_OTHER): Payer: Self-pay

## 2020-12-06 NOTE — Telephone Encounter (Signed)
-----   Message from Alois Cliche, RN sent at 11/29/2020  8:40 AM EDT ----- Regarding: sleep study Please precert and schedule pt.  Thank You,  Carlyle Basques

## 2020-12-09 NOTE — Telephone Encounter (Signed)
This encounter was created in error - please disregard.

## 2020-12-10 DIAGNOSIS — N401 Enlarged prostate with lower urinary tract symptoms: Secondary | ICD-10-CM | POA: Diagnosis not present

## 2020-12-10 DIAGNOSIS — N138 Other obstructive and reflux uropathy: Secondary | ICD-10-CM | POA: Diagnosis not present

## 2020-12-13 ENCOUNTER — Other Ambulatory Visit (HOSPITAL_BASED_OUTPATIENT_CLINIC_OR_DEPARTMENT_OTHER): Payer: Self-pay

## 2020-12-21 ENCOUNTER — Other Ambulatory Visit (HOSPITAL_BASED_OUTPATIENT_CLINIC_OR_DEPARTMENT_OTHER): Payer: Self-pay

## 2020-12-21 MED ORDER — PFIZER COVID-19 VAC BIVALENT 30 MCG/0.3ML IM SUSP
INTRAMUSCULAR | 0 refills | Status: DC
  Filled 2020-12-21: qty 0.3, 1d supply, fill #0

## 2020-12-21 NOTE — Telephone Encounter (Signed)
Patient is scheduled for lab study on 02/02/21. Patient understands his sleep study will be done at [facility] sleep lab. Patient understands he will receive a sleep packet in a week or so. Patient understands to call if he does not receive the sleep packet in a timely manner.  Left detailed message on voicemail with date and time of titration and informed patient to call back to confirm or reschedule.

## 2020-12-27 ENCOUNTER — Other Ambulatory Visit (HOSPITAL_BASED_OUTPATIENT_CLINIC_OR_DEPARTMENT_OTHER): Payer: Self-pay

## 2020-12-29 ENCOUNTER — Other Ambulatory Visit: Payer: Self-pay | Admitting: *Deleted

## 2020-12-29 ENCOUNTER — Encounter: Payer: Self-pay | Admitting: Interventional Cardiology

## 2020-12-31 DIAGNOSIS — R413 Other amnesia: Secondary | ICD-10-CM | POA: Diagnosis not present

## 2021-01-13 NOTE — Progress Notes (Signed)
Cardiology Office Note:    Date:  01/14/2021   ID:  Kenneth Melendez, DOB [birth year], MRN [id number]  PCP:  Benito Mccreedy, MD  Cardiologist:  Sinclair Grooms, MD   Referring MD: Benito Mccreedy, MD   Chief Complaint  Patient presents with   Atrial Fibrillation    Atrial flutter    History of Present Illness:    Kenneth Melendez is a 66 y.o. male with a hx of primary hypertension, snores but no diagnosis of sleep apnea (wife is a physician and is concerned that this may in fact be a problem), EP, Prediabetes, hyperlipidemia, and recent episode of atrial flutter treated with ER cardioversion protocol 10/09/2020.   Kenneth Melendez and Dr. Carlota Raspberry are here together.  Kenneth Melendez had a 1 month monitor to look in for recurrent atrial for of atrial flutter and none occurred.  Based upon that, anticoagulation was discontinued.  During the week before Thanksgiving, he felt somewhat funny.  No vital signs were recorded.  Today he feels well.  On my first auscultation his heart rate was going about 150 bpm and regular.  EKG confirmed typical atrial flutter with 2-1 AV conduction.  Past Medical History:  Diagnosis Date   Erectile dysfunction    Hemorrhoids    Hyperlipidemia    Hypertension    Migraine    followed by [facility]   Pre-diabetes    Urethral stricture     Past Surgical History:  Procedure Laterality Date   APPENDECTOMY  1970's   BUNIONECTOMY Right 2005  approx   COLONOSCOPY  04/20/2009   CYSTOSCOPY WITH URETHRAL DILATATION N/A 01/17/2016   Procedure: CYSTOSCOPY WITH URETHRAL  DILATATION;  Surgeon: Cleon Gustin, MD;  Location: [facility];  Service: Urology;  Laterality: N/A;   HEMORRHOIDECTOMY WITH HEMORRHOID BANDING  06/14/2010   INGUINAL HERNIA REPAIR Left 1996  approx   SHOULDER ARTHROSCOPY  01/02/2011   Procedure: ARTHROSCOPY SHOULDER;  Surgeon: Sharmon Revere;  Location: [place];  Service: Orthopedics;  Laterality: Left;   SUPRAUMBILICAL HERNIA  REPAIR W/ MESH  05/14/2009    Current Medications: Current Meds  Medication Sig   acetaminophen (TYLENOL) 325 MG tablet Take 650 mg by mouth every 6 (six) hours as needed for mild pain or moderate pain.   acyclovir (ZOVIRAX) 400 MG tablet Take 1 tablet by mouth 3 times a day for 7 days   apixaban (ELIQUIS) 5 MG TABS tablet Take 1 tablet (5 mg total) by mouth 2 (two) times daily.   Apoaequorin (PREVAGEN PO) Take by mouth.   Ascorbic Acid (VITAMIN C) 1000 MG tablet Take 1,000 mg by mouth daily.   atorvastatin (LIPITOR) 10 MG tablet TAKE 1 TABLET BY MOUTH ONCE DAILY   Butalbital-APAP-Caff-Cod 50-300-40-30 MG CAPS Take 1 capsule by mouth every 4 hours as needed for 5 days   Butalbital-APAP-Caffeine (FIORICET) 50-300-40 MG CAPS Take 1 capsule by mouth every 6 hours as needed   Butalbital-APAP-Caffeine 50-300-40 MG CAPS as needed.   Cinnamon 500 MG capsule Take 500 mg by mouth 2 (two) times daily.     COVID-19 mRNA bivalent vaccine, Pfizer, (PFIZER COVID-19 VAC BIVALENT) injection Inject into the muscle.   COVID-19 mRNA Vac-TriS, Pfizer, (PFIZER-BIONT COVID-19 VAC-TRIS) SUSP injection Inject into the muscle.   gabapentin (NEURONTIN) 300 MG capsule Take 1 capsule (300 mg total) by mouth 3 (three) times daily   gabapentin (NEURONTIN) 300 MG capsule Take 1 capsule by mouth 4 times a day as needed   GEMTESA  75 MG TABS Take 1 tablet by mouth daily as needed.   glucosamine-chondroitin 500-400 MG tablet Take 1 tablet by mouth in the morning and at bedtime.   influenza vaccine adjuvanted (FLUAD) 0.5 ML injection Inject into the muscle.   metoprolol succinate (TOPROL-XL) 50 MG 24 hr tablet Take 1 tablet (50 mg total) by mouth daily. Take with or immediately following a meal.   Multiple Vitamin (MULITIVITAMIN WITH MINERALS) TABS Take 1 tablet by mouth daily.     ondansetron (ZOFRAN-ODT) 4 MG disintegrating tablet Take 1 tablet by mouth 1 - 3 times daily   SUMAtriptan (IMITREX) 100 MG tablet TAKE 1 TABLET  BY MOUTH ONCE. IF HEADACHE RECURS MAY REPEAT AFTER 2 HOURS **MAX OF 2 TABLETS PER 24 HOURS**   SUMAtriptan (IMITREX) 100 MG tablet Take 1 tablet by mouth as needed for headache, may repeat after 2 hours if headache recurs, no more than 2 tablets in 24 hrs   SUMAtriptan (IMITREX) 100 MG tablet 100 mg as needed.   tadalafil (CIALIS) 20 MG tablet Take 20 mg by mouth daily as needed for erectile dysfunction.   tamsulosin (FLOMAX) 0.4 MG CAPS capsule Take 0.4 mg by mouth daily after breakfast.    [DISCONTINUED] losartan (COZAAR) 50 MG tablet Take 50 mg by mouth daily. Pt take 50 mg daily     Allergies:   Patient has no known allergies.   Social History   Socioeconomic History   Marital status: Married    Spouse name: Not on file   Number of children: Not on file   Years of education: Not on file   Highest education level: Not on file  Occupational History   Occupation: Retired   Tobacco Use   Smoking status: Never   Smokeless tobacco: Never  Substance and Sexual Activity   Alcohol use: Yes    Alcohol/week: 0.0 standard drinks    Comment: social    Drug use: No   Sexual activity: Not on file  Other Topics Concern   Not on file  Social History Narrative   Not on file   Social Determinants of Health   Financial Resource Strain: Not on file  Food Insecurity: Not on file  Transportation Needs: Not on file  Physical Activity: Not on file  Stress: Not on file  Social Connections: Not on file     Family History: The patient's family history includes Diabetes in his mother; Heart attack in his father and mother; Heart disease in his mother; Hypotension in his mother. There is no history of Anesthesia problems, Malignant hyperthermia, or Pseudochol deficiency.  ROS:   Please see the history of present illness.    He has mild numbness on the bottom of his right foot after disc resection.  Pain is gone.  All other systems reviewed and are negative.  EKGs/Labs/Other Studies Reviewed:     The following studies were reviewed today: 30-day monitor: Study Highlights    NSR with no instances of AF or AFlut. Nocturnal bradycardia suggests OSA Rare PAC's and PVC's   Overall normal.  Okay to stop Eliquis Decrease Toprol by 50% for one week then DC. Needs Sleep Study  EKG:  EKG typical atrial flutter with negatively inverted inferior lead flutter waves and ventricular response 146 bpm.  Patient is asymptomatic.  Recent Labs: 10/09/2020: Magnesium 1.9; TSH 1.567 11/26/2020: BUN 20; Creatinine, Ser 0.91; Hemoglobin 13.8; Platelets 243; Potassium 4.0; Sodium 141  Recent Lipid Panel No results found for: CHOL, TRIG, HDL, CHOLHDL, VLDL,  LDLCALC, LDLDIRECT  Physical Exam:    VS:  BP 102/70   Pulse 76   Ht 6\' 4"  (1.93 m)   Wt 230 lb 3.2 oz (104.4 kg)   SpO2 96%   BMI 28.02 kg/m     Wt Readings from Last 3 Encounters:  01/14/21 230 lb 3.2 oz (104.4 kg)  11/26/20 219 lb (99.3 kg)  10/15/20 228 lb (103.4 kg)     GEN: Healthy appearing. No acute distress HEENT: Normal NECK: No JVD. LYMPHATICS: No lymphadenopathy CARDIAC: No murmur.  Rapid heart rate with regular rhythm.  No gallop, or edema. VASCULAR:  Normal Pulses. No bruits. RESPIRATORY:  Clear to auscultation without rales, wheezing or rhonchi  ABDOMEN: Soft, non-tender, non-distended, No pulsatile mass, MUSCULOSKELETAL: No deformity  SKIN: Warm and dry NEUROLOGIC:  Alert and oriented x 3 PSYCHIATRIC:  Normal affect   ASSESSMENT:    1. Typical atrial flutter ([place])   2. Atrial fibrillation, unspecified type ([place])   3. Essential hypertension   4. Hyperlipidemia LDL goal <70   5. Prediabetes   6. Secondary hypercoagulable state ([place])    PLAN:    In order of problems listed above:  Currently present.  Start metoprolol 50 mg/day.  We will see how he does over the weekend.  If he develops symptoms he needs to go to the emergency room.  Discontinue losartan.  Start apixaban 5 mg twice daily.  Will need to  refer to electrophysiology, Dr. Crissie Sickles. No instances of atrial fibs have been identified. Blood pressure is relatively low today.  He has had losartan 50 mg today.  This medication will be discontinued.  Metoprolol is added. Continue statin therapy Follow Resume apixaban  He will need to be on long-term anticoagulation therapy.  He will be referred to EP.  He is hemodynamically stable.  We will start a beta-blocker to hopefully help slow rate somewhat.  If it does not convert by first of the week, he will need to have outpatient electrical conversion.  He should call in on Monday with heart rate and blood pressure recordings.  Go to the emergency room if low blood pressures or symptoms over the weekend.   Medication Adjustments/Labs and Tests Ordered: Current medicines are reviewed at length with the patient today.  Concerns regarding medicines are outlined above.  Orders Placed This Encounter  Procedures   Ambulatory referral to Cardiac Electrophysiology   EKG 12-Lead   Meds ordered this encounter  Medications   metoprolol succinate (TOPROL-XL) 50 MG 24 hr tablet    Sig: Take 1 tablet (50 mg total) by mouth daily. Take with or immediately following a meal.    Dispense:  90 tablet    Refill:  3    Dose change   apixaban (ELIQUIS) 5 MG TABS tablet    Sig: Take 1 tablet (5 mg total) by mouth 2 (two) times daily.    Dispense:  60 tablet    Refill:  11    There are no Patient Instructions on file for this visit.   Signed, Sinclair Grooms, MD  01/14/2021 11:22 AM    [facility]

## 2021-01-14 ENCOUNTER — Ambulatory Visit: Payer: Federal, State, Local not specified - PPO | Admitting: Interventional Cardiology

## 2021-01-14 ENCOUNTER — Encounter: Payer: Self-pay | Admitting: Interventional Cardiology

## 2021-01-14 ENCOUNTER — Other Ambulatory Visit: Payer: Self-pay

## 2021-01-14 ENCOUNTER — Other Ambulatory Visit (HOSPITAL_BASED_OUTPATIENT_CLINIC_OR_DEPARTMENT_OTHER): Payer: Self-pay

## 2021-01-14 VITALS — BP 102/70 | HR 76 | Ht 76.0 in | Wt 230.2 lb

## 2021-01-14 DIAGNOSIS — I483 Typical atrial flutter: Secondary | ICD-10-CM | POA: Diagnosis not present

## 2021-01-14 DIAGNOSIS — E785 Hyperlipidemia, unspecified: Secondary | ICD-10-CM

## 2021-01-14 DIAGNOSIS — I4891 Unspecified atrial fibrillation: Secondary | ICD-10-CM

## 2021-01-14 DIAGNOSIS — D6869 Other thrombophilia: Secondary | ICD-10-CM

## 2021-01-14 DIAGNOSIS — I10 Essential (primary) hypertension: Secondary | ICD-10-CM | POA: Diagnosis not present

## 2021-01-14 DIAGNOSIS — R7303 Prediabetes: Secondary | ICD-10-CM

## 2021-01-14 MED ORDER — APIXABAN 5 MG PO TABS
5.0000 mg | ORAL_TABLET | Freq: Two times a day (BID) | ORAL | 11 refills | Status: DC
  Filled 2021-01-14: qty 60, 30d supply, fill #0
  Filled 2021-02-14: qty 60, 30d supply, fill #1

## 2021-01-14 MED ORDER — METOPROLOL SUCCINATE ER 50 MG PO TB24
50.0000 mg | ORAL_TABLET | Freq: Every day | ORAL | 3 refills | Status: DC
  Filled 2021-01-14: qty 90, 90d supply, fill #0

## 2021-01-14 NOTE — Patient Instructions (Signed)
Medication Instructions:  1) DISCONTINUE Losartan 2) START Eliquis 5mg  twice daily 3) INCREASE Metoprolol Succinate to 50mg  once daily  *If you need a refill on your cardiac medications before your next appointment, please call your pharmacy*   Lab Work: None If you have labs (blood work) drawn today and your tests are completely normal, you will receive your results only by: MyChart Message (if you have MyChart) OR A paper copy in the mail If you have any lab test that is abnormal or we need to change your treatment, we will call you to review the results.   Testing/Procedures: None   Follow-Up:  You have been referred to Dr. in our Electrophysiology department.  Call on Monday with heart rate readings through the weekend.  This will help determine when we bring you back to see Dr. Ladona Ridgel.  :1}    Other Instructions

## 2021-01-17 ENCOUNTER — Telehealth: Payer: Self-pay | Admitting: Interventional Cardiology

## 2021-01-17 ENCOUNTER — Other Ambulatory Visit (HOSPITAL_BASED_OUTPATIENT_CLINIC_OR_DEPARTMENT_OTHER): Payer: Self-pay

## 2021-01-17 DIAGNOSIS — Z131 Encounter for screening for diabetes mellitus: Secondary | ICD-10-CM | POA: Diagnosis not present

## 2021-01-17 DIAGNOSIS — I10 Essential (primary) hypertension: Secondary | ICD-10-CM | POA: Diagnosis not present

## 2021-01-17 DIAGNOSIS — Z125 Encounter for screening for malignant neoplasm of prostate: Secondary | ICD-10-CM | POA: Diagnosis not present

## 2021-01-17 DIAGNOSIS — E039 Hypothyroidism, unspecified: Secondary | ICD-10-CM | POA: Diagnosis not present

## 2021-01-17 DIAGNOSIS — M5416 Radiculopathy, lumbar region: Secondary | ICD-10-CM | POA: Diagnosis not present

## 2021-01-17 DIAGNOSIS — M5459 Other low back pain: Secondary | ICD-10-CM | POA: Diagnosis not present

## 2021-01-17 DIAGNOSIS — Z136 Encounter for screening for cardiovascular disorders: Secondary | ICD-10-CM | POA: Diagnosis not present

## 2021-01-17 DIAGNOSIS — I480 Paroxysmal atrial fibrillation: Secondary | ICD-10-CM | POA: Diagnosis not present

## 2021-01-17 DIAGNOSIS — Z0001 Encounter for general adult medical examination with abnormal findings: Secondary | ICD-10-CM | POA: Diagnosis not present

## 2021-01-17 DIAGNOSIS — G43009 Migraine without aura, not intractable, without status migrainosus: Secondary | ICD-10-CM | POA: Diagnosis not present

## 2021-01-17 DIAGNOSIS — E782 Mixed hyperlipidemia: Secondary | ICD-10-CM | POA: Diagnosis not present

## 2021-01-17 MED ORDER — LOSARTAN POTASSIUM 25 MG PO TABS
ORAL_TABLET | ORAL | 1 refills | Status: DC
  Filled 2021-01-17: qty 180, 90d supply, fill #0

## 2021-01-17 NOTE — Telephone Encounter (Signed)
   Pt c/o BP issue: STAT if pt c/o blurred vision, one-sided weakness or slurred speech  1. What are your last 5 BP readings?  01/15/21 6:45 am -  BP 115/76 HR 56                                  BP 129/79 HR 75                                  BP 113/72 HR 73 01/16/21 5:10 pm -  BP 128/87 HR 76                                   BP 122/76 HR 74  01/17/21 10:30 am - BP  159/87 HR 74   2. Are you having any other symptoms (ex. Dizziness, headache, blurred vision, passed out)? No symptoms   3. What is your BP issue? Pt said he was told by Dr. Katrinka Blazing to record his BP and HR readings

## 2021-01-17 NOTE — Telephone Encounter (Signed)
Pt called to report BP readings as instructed after stopping Losartan and starting Metoprolol on 01/14/21.

## 2021-01-18 NOTE — Telephone Encounter (Signed)
Spoke with wife to make her aware of recommendations from Dr. Katrinka Blazing.  She states pt seen PCP yesterday and BP was 150s/117 so PCP called Dr. Katrinka Blazing and they agreed pt should go back on Losartan at 25mg  QD.  Advised to continue monitoring BPs and let know if BP remains elevated after a week.  Wife in agreement with plan.

## 2021-01-19 DIAGNOSIS — I483 Typical atrial flutter: Secondary | ICD-10-CM | POA: Diagnosis not present

## 2021-01-19 DIAGNOSIS — M5416 Radiculopathy, lumbar region: Secondary | ICD-10-CM | POA: Diagnosis not present

## 2021-01-19 DIAGNOSIS — Z79899 Other long term (current) drug therapy: Secondary | ICD-10-CM | POA: Diagnosis not present

## 2021-01-19 DIAGNOSIS — R519 Headache, unspecified: Secondary | ICD-10-CM | POA: Diagnosis not present

## 2021-01-19 DIAGNOSIS — I498 Other specified cardiac arrhythmias: Secondary | ICD-10-CM | POA: Diagnosis not present

## 2021-01-19 DIAGNOSIS — I4892 Unspecified atrial flutter: Secondary | ICD-10-CM | POA: Diagnosis not present

## 2021-01-19 DIAGNOSIS — I10 Essential (primary) hypertension: Secondary | ICD-10-CM | POA: Diagnosis not present

## 2021-01-19 DIAGNOSIS — Z8249 Family history of ischemic heart disease and other diseases of the circulatory system: Secondary | ICD-10-CM | POA: Diagnosis not present

## 2021-01-19 DIAGNOSIS — R7303 Prediabetes: Secondary | ICD-10-CM | POA: Diagnosis not present

## 2021-01-19 DIAGNOSIS — D6869 Other thrombophilia: Secondary | ICD-10-CM | POA: Diagnosis not present

## 2021-01-19 DIAGNOSIS — Z7901 Long term (current) use of anticoagulants: Secondary | ICD-10-CM | POA: Diagnosis not present

## 2021-01-19 DIAGNOSIS — Z833 Family history of diabetes mellitus: Secondary | ICD-10-CM | POA: Diagnosis not present

## 2021-01-19 DIAGNOSIS — G43009 Migraine without aura, not intractable, without status migrainosus: Secondary | ICD-10-CM | POA: Diagnosis not present

## 2021-01-19 DIAGNOSIS — E785 Hyperlipidemia, unspecified: Secondary | ICD-10-CM | POA: Diagnosis not present

## 2021-01-19 DIAGNOSIS — I4891 Unspecified atrial fibrillation: Secondary | ICD-10-CM | POA: Diagnosis not present

## 2021-01-20 ENCOUNTER — Other Ambulatory Visit (HOSPITAL_BASED_OUTPATIENT_CLINIC_OR_DEPARTMENT_OTHER): Payer: Self-pay

## 2021-01-20 DIAGNOSIS — E785 Hyperlipidemia, unspecified: Secondary | ICD-10-CM | POA: Diagnosis not present

## 2021-01-20 DIAGNOSIS — I10 Essential (primary) hypertension: Secondary | ICD-10-CM | POA: Diagnosis not present

## 2021-01-20 DIAGNOSIS — I483 Typical atrial flutter: Secondary | ICD-10-CM | POA: Diagnosis not present

## 2021-01-20 DIAGNOSIS — G43909 Migraine, unspecified, not intractable, without status migrainosus: Secondary | ICD-10-CM | POA: Diagnosis not present

## 2021-01-20 DIAGNOSIS — M5416 Radiculopathy, lumbar region: Secondary | ICD-10-CM | POA: Diagnosis not present

## 2021-01-20 DIAGNOSIS — I4892 Unspecified atrial flutter: Secondary | ICD-10-CM | POA: Diagnosis not present

## 2021-01-20 DIAGNOSIS — I4891 Unspecified atrial fibrillation: Secondary | ICD-10-CM | POA: Diagnosis not present

## 2021-01-20 DIAGNOSIS — R079 Chest pain, unspecified: Secondary | ICD-10-CM | POA: Diagnosis not present

## 2021-01-20 MED ORDER — LOSARTAN POTASSIUM 25 MG PO TABS
25.0000 mg | ORAL_TABLET | Freq: Every day | ORAL | 3 refills | Status: DC
  Filled 2021-05-13 (×2): qty 30, 30d supply, fill #0

## 2021-01-24 ENCOUNTER — Other Ambulatory Visit (HOSPITAL_BASED_OUTPATIENT_CLINIC_OR_DEPARTMENT_OTHER): Payer: Self-pay

## 2021-01-24 DIAGNOSIS — M5459 Other low back pain: Secondary | ICD-10-CM | POA: Diagnosis not present

## 2021-01-24 DIAGNOSIS — R413 Other amnesia: Secondary | ICD-10-CM | POA: Diagnosis not present

## 2021-01-24 DIAGNOSIS — G43709 Chronic migraine without aura, not intractable, without status migrainosus: Secondary | ICD-10-CM | POA: Diagnosis not present

## 2021-01-24 DIAGNOSIS — M5416 Radiculopathy, lumbar region: Secondary | ICD-10-CM | POA: Diagnosis not present

## 2021-01-25 ENCOUNTER — Other Ambulatory Visit (HOSPITAL_BASED_OUTPATIENT_CLINIC_OR_DEPARTMENT_OTHER): Payer: Self-pay

## 2021-01-26 ENCOUNTER — Other Ambulatory Visit: Payer: Self-pay

## 2021-01-26 ENCOUNTER — Ambulatory Visit: Payer: Federal, State, Local not specified - PPO | Admitting: Internal Medicine

## 2021-01-26 ENCOUNTER — Encounter: Payer: Self-pay | Admitting: Internal Medicine

## 2021-01-26 ENCOUNTER — Other Ambulatory Visit (HOSPITAL_BASED_OUTPATIENT_CLINIC_OR_DEPARTMENT_OTHER): Payer: Self-pay

## 2021-01-26 VITALS — BP 148/72 | HR 49 | Ht 76.0 in | Wt 227.0 lb

## 2021-01-26 DIAGNOSIS — I483 Typical atrial flutter: Secondary | ICD-10-CM

## 2021-01-26 NOTE — Progress Notes (Signed)
HPI [name] is referred by Dr. Katrinka Blazing for evaluation of atrial flutter. He is a pleasant 65 yo man with a h/o HTN who developed atrial flutter and underwent DCCV for the second time a week ago. He feels tired when out of rhythm and denies chest pain or sob.  No Known Allergies   Current Outpatient Medications  Medication Sig Dispense Refill   apixaban (ELIQUIS) 5 MG TABS tablet Take 1 tablet (5 mg total) by mouth 2 (two) times daily. 60 tablet 11   Ascorbic Acid (VITAMIN C) 1000 MG tablet Take 1,000 mg by mouth daily.     atorvastatin (LIPITOR) 10 MG tablet TAKE 1 TABLET BY MOUTH ONCE DAILY 90 tablet 3   Cinnamon 500 MG capsule Take 500 mg by mouth 2 (two) times daily.       COVID-19 mRNA bivalent vaccine, Pfizer, (PFIZER COVID-19 VAC BIVALENT) injection Inject into the muscle. 0.3 mL 0   COVID-19 mRNA Vac-TriS, Pfizer, (PFIZER-BIONT COVID-19 VAC-TRIS) SUSP injection Inject into the muscle. 0.3 mL 0   gabapentin (NEURONTIN) 300 MG capsule Take 1 capsule (300 mg total) by mouth 3 (three) times daily 90 capsule 11   gabapentin (NEURONTIN) 300 MG capsule Take 1 capsule by mouth 4 times a day as needed 120 capsule 3   GEMTESA 75 MG TABS Take 1 tablet by mouth daily as needed.     glucosamine-chondroitin 500-400 MG tablet Take 1 tablet by mouth in the morning and at bedtime.     influenza vaccine adjuvanted (FLUAD) 0.5 ML injection Inject into the muscle. 0.5 mL 0   losartan (COZAAR) 25 MG tablet Take 2 tablets by mouth once daily 180 tablet 1   losartan (COZAAR) 25 MG tablet Take 1 tablet by mouth daily. 30 tablet 3   metoprolol succinate (TOPROL-XL) 50 MG 24 hr tablet Take 1 tablet (50 mg total) by mouth daily. Take with or immediately following a meal. 90 tablet 3   Multiple Vitamin (MULITIVITAMIN WITH MINERALS) TABS Take 1 tablet by mouth daily.       ondansetron (ZOFRAN-ODT) 4 MG disintegrating tablet Take 1 tablet by mouth 1 - 3 times daily 30 tablet 3   tadalafil (CIALIS) 20 MG  tablet Take 20 mg by mouth daily as needed for erectile dysfunction.     tamsulosin (FLOMAX) 0.4 MG CAPS capsule Take 0.4 mg by mouth daily after breakfast.   1   No current facility-administered medications for this visit.     Past Medical History:  Diagnosis Date   Erectile dysfunction    Hemorrhoids    Hyperlipidemia    Hypertension    Migraine    followed by [facility]   Pre-diabetes    Urethral stricture     ROS:   All systems reviewed and negative except as noted in the HPI.   Past Surgical History:  Procedure Laterality Date   APPENDECTOMY  1970's   BUNIONECTOMY Right 2005  approx   COLONOSCOPY  04/20/2009   CYSTOSCOPY WITH URETHRAL DILATATION N/A 01/17/2016   Procedure: CYSTOSCOPY WITH URETHRAL  DILATATION;  Surgeon: [name], MD;  Location: [facility];  Service: Urology;  Laterality: N/A;   HEMORRHOIDECTOMY WITH HEMORRHOID BANDING  06/14/2010   INGUINAL HERNIA REPAIR Left 1996  approx   SHOULDER ARTHROSCOPY  01/02/2011   Procedure: ARTHROSCOPY SHOULDER;  Surgeon: [name];  Location: [facility];  Service: Orthopedics;  Laterality: Left;   SUPRAUMBILICAL HERNIA REPAIR W/ MESH  05/14/2009     Family History  Problem Relation Age of Onset   Heart attack Father    Diabetes Mother    Heart attack Mother    Heart disease Mother    Hypotension Mother    Anesthesia problems Neg Hx    Malignant hyperthermia Neg Hx    Pseudochol deficiency Neg Hx      Social History   Socioeconomic History   Marital status: Married    Spouse name: Not on file   Number of children: Not on file   Years of education: Not on file   Highest education level: Not on file  Occupational History   Occupation: Retired   Tobacco Use   Smoking status: Never   Smokeless tobacco: Never  Substance and Sexual Activity   Alcohol use: Yes    Alcohol/week: 0.0 standard drinks    Comment: social    Drug use: No   Sexual activity: Not on file  Other  Topics Concern   Not on file  Social History Narrative   Not on file   Social Determinants of Health   Financial Resource Strain: Not on file  Food Insecurity: Not on file  Transportation Needs: Not on file  Physical Activity: Not on file  Stress: Not on file  Social Connections: Not on file  Intimate Partner Violence: Not on file     BP (!) 148/72    Pulse (!) 49    Ht 6\' 4"  (1.93 m)    Wt 227 lb (103 kg)    SpO2 96%    BMI 27.63 kg/m   Physical Exam:  Well appearing NAD HEENT: Unremarkable Neck:  No JVD, no thyromegally Lymphatics:  No adenopathy Back:  No CVA tenderness Lungs:  Clear with no wheezes HEART:  Regular rate rhythm, no murmurs, no rubs, no clicks Abd:  soft, positive bowel sounds, no organomegally, no rebound, no guarding Ext:  2 plus pulses, no edema, no cyanosis, no clubbing Skin:  No rashes no nodules Neuro:  CN II through XII intact, motor grossly intact  EKG - sinus bradycardia  Assess/Plan:  Atrial flutter - he has had a couple of episodes and I have recommended he undergo EPS/RFA of atrial flutter. He will call us if he wishes to proceed. Coags - he will continue his eliquis.  HTN -his bp is up a bit today but is usually well controlled on medical therapy.  [name] [name],MD

## 2021-01-26 NOTE — Patient Instructions (Addendum)
Medication Instructions:  Your physician recommends that you continue on your current medications as directed. Please refer to the Current Medication list given to you today.  Labwork: None ordered.  Testing/Procedures: None ordered.  Follow-Up:  SEE INSTRUCTION LETTER  Any Other Special Instructions Will Be Listed Below (If Applicable).  If you need a refill on your cardiac medications before your next appointment, please call your pharmacy.   Cardiac Ablation Cardiac ablation is a procedure to destroy, or ablate, a small amount of heart tissue in very specific places. The heart has many electrical connections. Sometimes these connections are abnormal and can cause the heart to beat very fast or irregularly. Ablating some of the areas that cause problems can improve the heart's rhythm or return it to normal. Ablation may be done for people who: Have Wolff-Parkinson-White syndrome. Have fast heart rhythms (tachycardia). Have taken medicines for an abnormal heart rhythm (arrhythmia) that were not effective or caused side effects. Have a high-risk heartbeat that may be life-threatening. During the procedure, a small incision is made in the neck or the groin, and a long, thin tube (catheter) is inserted into the incision and moved to the heart. Small devices (electrodes) on the tip of the catheter will send out electrical currents. A type of X-ray (fluoroscopy) will be used to help guide the catheter and to provide images of the heart. Tell a health care provider about: Any allergies you have. All medicines you are taking, including vitamins, herbs, eye drops, creams, and over-the-counter medicines. Any problems you or family members have had with anesthetic medicines. Any blood disorders you have. Any surgeries you have had. Any medical conditions you have, such as kidney failure. Whether you are pregnant or may be pregnant. What are the risks? Generally, this is a safe procedure.  However, problems may occur, including: Infection. Bruising and bleeding at the catheter insertion site. Bleeding into the chest, especially into the sac that surrounds the heart. This is a serious complication. Stroke or blood clots. Damage to nearby structures or organs. Allergic reaction to medicines or dyes. Need for a permanent pacemaker if the normal electrical system is damaged. A pacemaker is a small computer that sends electrical signals to the heart and helps your heart beat normally. The procedure not being fully effective. This may not be recognized until months later. Repeat ablation procedures are sometimes done. What happens before the procedure? Medicines Ask your health care provider about: Changing or stopping your regular medicines. This is especially important if you are taking diabetes medicines or blood thinners. Taking medicines such as aspirin and ibuprofen. These medicines can thin your blood. Do not take these medicines unless your health care provider tells you to take them. Taking over-the-counter medicines, vitamins, herbs, and supplements. General instructions Follow instructions from your health care provider about eating or drinking restrictions. Plan to have someone take you home from the hospital or clinic. If you will be going home right after the procedure, plan to have someone with you for 24 hours. Ask your health care provider what steps will be taken to prevent infection. What happens during the procedure?  An IV will be inserted into one of your veins. You will be given a medicine to help you relax (sedative). The skin on your neck or groin will be numbed. An incision will be made in your neck or your groin. A needle will be inserted through the incision and into a large vein in your neck or groin. A catheter will   be inserted into the needle and moved to your heart. Dye may be injected through the catheter to help your surgeon see the area of the  heart that needs treatment. Electrical currents will be sent from the catheter to ablate heart tissue in desired areas. There are three types of energy that may be used to do this: Heat (radiofrequency energy). Laser energy. Extreme cold (cryoablation). When the tissue has been ablated, the catheter will be removed. Pressure will be held on the insertion area to prevent a lot of bleeding. A bandage (dressing) will be placed over the insertion area. The exact procedure may vary among health care providers and hospitals. What happens after the procedure? Your blood pressure, heart rate, breathing rate, and blood oxygen level will be monitored until you leave the hospital or clinic. Your insertion area will be monitored for bleeding. You will need to lie still for a few hours to ensure that you do not bleed from the insertion area. Do not drive for 24 hours or as long as told by your health care provider. Summary Cardiac ablation is a procedure to destroy, or ablate, a small amount of heart tissue using an electrical current. This procedure can improve the heart rhythm or return it to normal. Tell your health care provider about any medical conditions you may have and all medicines you are taking to treat them. This is a safe procedure, but problems may occur. Problems may include infection, bruising, damage to nearby organs or structures, or allergic reactions to medicines. Follow your health care provider's instructions about eating and drinking before the procedure. You may also be told to change or stop some of your medicines. After the procedure, do not drive for 24 hours or as long as told by your health care provider. This information is not intended to replace advice given to you by your health care provider. Make sure you discuss any questions you have with your health care provider. Document Revised: 12/02/2018 Document Reviewed: 12/02/2018 Elsevier Patient Education  2022 [employer]  [employer]    

## 2021-01-26 NOTE — H&P (View-Only) (Signed)
HPI Kenneth Melendez is referred by Dr. Katrinka Melendez for evaluation of atrial flutter. He is a pleasant 65 yo man with a h/o HTN who developed atrial flutter and underwent DCCV for the second time a week ago. He feels tired when out of rhythm and denies chest pain or sob.  No Known Allergies   Current Outpatient Medications  Medication Sig Dispense Refill   apixaban (ELIQUIS) 5 MG TABS tablet Take 1 tablet (5 mg total) by mouth 2 (two) times daily. 60 tablet 11   Ascorbic Acid (VITAMIN C) 1000 MG tablet Take 1,000 mg by mouth daily.     atorvastatin (LIPITOR) 10 MG tablet TAKE 1 TABLET BY MOUTH ONCE DAILY 90 tablet 3   Cinnamon 500 MG capsule Take 500 mg by mouth 2 (two) times daily.       COVID-19 mRNA bivalent vaccine, Pfizer, (PFIZER COVID-19 VAC BIVALENT) injection Inject into the muscle. 0.3 mL 0   COVID-19 mRNA Vac-TriS, Pfizer, (PFIZER-BIONT COVID-19 VAC-TRIS) SUSP injection Inject into the muscle. 0.3 mL 0   gabapentin (NEURONTIN) 300 MG capsule Take 1 capsule (300 mg total) by mouth 3 (three) times daily 90 capsule 11   gabapentin (NEURONTIN) 300 MG capsule Take 1 capsule by mouth 4 times a day as needed 120 capsule 3   GEMTESA 75 MG TABS Take 1 tablet by mouth daily as needed.     glucosamine-chondroitin 500-400 MG tablet Take 1 tablet by mouth in the morning and at bedtime.     influenza vaccine adjuvanted (FLUAD) 0.5 ML injection Inject into the muscle. 0.5 mL 0   losartan (COZAAR) 25 MG tablet Take 2 tablets by mouth once daily 180 tablet 1   losartan (COZAAR) 25 MG tablet Take 1 tablet by mouth daily. 30 tablet 3   metoprolol succinate (TOPROL-XL) 50 MG 24 hr tablet Take 1 tablet (50 mg total) by mouth daily. Take with or immediately following a meal. 90 tablet 3   Multiple Vitamin (MULITIVITAMIN WITH MINERALS) TABS Take 1 tablet by mouth daily.       ondansetron (ZOFRAN-ODT) 4 MG disintegrating tablet Take 1 tablet by mouth 1 - 3 times daily 30 tablet 3   tadalafil (CIALIS) 20 MG  tablet Take 20 mg by mouth daily as needed for erectile dysfunction.     tamsulosin (FLOMAX) 0.4 MG CAPS capsule Take 0.4 mg by mouth daily after breakfast.   1   No current facility-administered medications for this visit.     Past Medical History:  Diagnosis Date   Erectile dysfunction    Hemorrhoids    Hyperlipidemia    Hypertension    Migraine    followed by [facility]   Pre-diabetes    Urethral stricture     ROS:   All systems reviewed and negative except as noted in the HPI.   Past Surgical History:  Procedure Laterality Date   APPENDECTOMY  1970's   BUNIONECTOMY Right 2005  approx   COLONOSCOPY  04/20/2009   CYSTOSCOPY WITH URETHRAL DILATATION N/A 01/17/2016   Procedure: CYSTOSCOPY WITH URETHRAL  DILATATION;  Surgeon: Kenneth Gauze, MD;  Location: [facility];  Service: Urology;  Laterality: N/A;   HEMORRHOIDECTOMY WITH HEMORRHOID BANDING  06/14/2010   INGUINAL HERNIA REPAIR Left 1996  approx   SHOULDER ARTHROSCOPY  01/02/2011   Procedure: ARTHROSCOPY SHOULDER;  Surgeon: Kenneth Melendez;  Location: [facility] OR;  Service: Orthopedics;  Laterality: Left;   SUPRAUMBILICAL HERNIA REPAIR W/ MESH  05/14/2009     Family History  Problem Relation Age of Onset   Heart attack Father    Diabetes Mother    Heart attack Mother    Heart disease Mother    Hypotension Mother    Anesthesia problems Neg Hx    Malignant hyperthermia Neg Hx    Pseudochol deficiency Neg Hx      Social History   Socioeconomic History   Marital status: Married    Spouse name: Not on file   Number of children: Not on file   Years of education: Not on file   Highest education level: Not on file  Occupational History   Occupation: Retired   Tobacco Use   Smoking status: Never   Smokeless tobacco: Never  Substance and Sexual Activity   Alcohol use: Yes    Alcohol/week: 0.0 standard drinks    Comment: social    Drug use: No   Sexual activity: Not on file  Other  Topics Concern   Not on file  Social History Narrative   Not on file   Social Determinants of Health   Financial Resource Strain: Not on file  Food Insecurity: Not on file  Transportation Needs: Not on file  Physical Activity: Not on file  Stress: Not on file  Social Connections: Not on file  Intimate Partner Violence: Not on file     BP (!) 148/72    Pulse (!) 49    Ht 6\' 4"  (1.93 m)    Wt 227 lb (103 kg)    SpO2 96%    BMI 27.63 kg/m   Physical Exam:  Well appearing NAD HEENT: Unremarkable Neck:  No JVD, no thyromegally Lymphatics:  No adenopathy Back:  No CVA tenderness Lungs:  Clear with no wheezes HEART:  Regular rate rhythm, no murmurs, no rubs, no clicks Abd:  soft, positive bowel sounds, no organomegally, no rebound, no guarding Ext:  2 plus pulses, no edema, no cyanosis, no clubbing Skin:  No rashes no nodules Neuro:  CN II through XII intact, motor grossly intact  EKG - sinus bradycardia  Assess/Plan:  Atrial flutter - he has had a couple of episodes and I have recommended he undergo EPS/RFA of atrial flutter. He will call us if he wishes to proceed. Coags - he will continue his eliquis.  HTN -his bp is up a bit today but is usually well controlled on medical therapy.  Kenneth Overlie Caty Tessler,MD

## 2021-01-28 DIAGNOSIS — M5459 Other low back pain: Secondary | ICD-10-CM | POA: Diagnosis not present

## 2021-01-28 DIAGNOSIS — M5416 Radiculopathy, lumbar region: Secondary | ICD-10-CM | POA: Diagnosis not present

## 2021-02-01 ENCOUNTER — Encounter: Payer: Self-pay | Admitting: Interventional Cardiology

## 2021-02-01 ENCOUNTER — Encounter: Payer: Self-pay | Admitting: Internal Medicine

## 2021-02-01 ENCOUNTER — Other Ambulatory Visit (HOSPITAL_BASED_OUTPATIENT_CLINIC_OR_DEPARTMENT_OTHER): Payer: Self-pay

## 2021-02-02 ENCOUNTER — Other Ambulatory Visit: Payer: Self-pay

## 2021-02-02 ENCOUNTER — Ambulatory Visit (HOSPITAL_BASED_OUTPATIENT_CLINIC_OR_DEPARTMENT_OTHER): Payer: Federal, State, Local not specified - PPO | Attending: Interventional Cardiology | Admitting: Cardiology

## 2021-02-02 DIAGNOSIS — G4733 Obstructive sleep apnea (adult) (pediatric): Secondary | ICD-10-CM | POA: Diagnosis not present

## 2021-02-02 DIAGNOSIS — I483 Typical atrial flutter: Secondary | ICD-10-CM | POA: Diagnosis not present

## 2021-02-02 DIAGNOSIS — R0683 Snoring: Secondary | ICD-10-CM | POA: Insufficient documentation

## 2021-02-02 DIAGNOSIS — I4891 Unspecified atrial fibrillation: Secondary | ICD-10-CM | POA: Diagnosis not present

## 2021-02-04 ENCOUNTER — Telehealth: Payer: Self-pay | Admitting: Interventional Cardiology

## 2021-02-04 NOTE — Telephone Encounter (Signed)
Spoke with pt and made him aware I had sent the message to Dr. Katrinka Blazing but he hadn't seen it yet.  Spoke with Dr. Lubertha Basque nurse, Boneta Lucks, prior to calling.  We discussed that HRs are fine as long as he is asymptomatic.  Pt denies any symptoms.  States he feels fine but was concerned about low HRs.  Advised HRs in the 40s during sleep are normal.  Told pt we don't necessarily worry about HRs in the upper 40s/50s during the day unless he's having symptoms.  Advised nothing in his message would prevent him from having his ablation next week.  Pt verbalized understanding and was appreciative for call.

## 2021-02-04 NOTE — Telephone Encounter (Signed)
Patient calling in because he hasnt heard anything from his mychart message. And he is having up coming procedure on 1/3 and needs to know what he should do. Please advise

## 2021-02-04 NOTE — Pre-Procedure Instructions (Signed)
Attempted to call patient regarding procedure instructions.  Left voicemail on the following items: Arrival time 1130 Nothing to eat or drink after midnight No meds AM of procedure Responsible person to drive you home and stay with you for 24 hrs  Have you missed any doses of anti-coagulant Eliquis-

## 2021-02-08 ENCOUNTER — Encounter (HOSPITAL_COMMUNITY): Payer: Self-pay | Admitting: Certified Registered Nurse Anesthetist

## 2021-02-08 ENCOUNTER — Other Ambulatory Visit: Payer: Self-pay

## 2021-02-08 ENCOUNTER — Encounter (HOSPITAL_COMMUNITY)
Admission: RE | Disposition: A | Discharge status: Home / Self Care | Payer: Self-pay | Source: Ambulatory Visit | Attending: Internal Medicine

## 2021-02-08 ENCOUNTER — Ambulatory Visit (HOSPITAL_COMMUNITY)
Admission: RE | Admit: 2021-02-08 | Discharge: 2021-02-09 | Disposition: A | Discharge status: Home / Self Care | Payer: Federal, State, Local not specified - PPO | Source: Ambulatory Visit | Attending: Internal Medicine | Admitting: Internal Medicine

## 2021-02-08 DIAGNOSIS — I495 Sick sinus syndrome: Secondary | ICD-10-CM | POA: Diagnosis not present

## 2021-02-08 DIAGNOSIS — I483 Typical atrial flutter: Secondary | ICD-10-CM | POA: Diagnosis not present

## 2021-02-08 DIAGNOSIS — Z7901 Long term (current) use of anticoagulants: Secondary | ICD-10-CM | POA: Diagnosis not present

## 2021-02-08 DIAGNOSIS — I10 Essential (primary) hypertension: Secondary | ICD-10-CM | POA: Insufficient documentation

## 2021-02-08 DIAGNOSIS — E785 Hyperlipidemia, unspecified: Secondary | ICD-10-CM | POA: Diagnosis not present

## 2021-02-08 DIAGNOSIS — G43909 Migraine, unspecified, not intractable, without status migrainosus: Secondary | ICD-10-CM | POA: Diagnosis not present

## 2021-02-08 DIAGNOSIS — I4892 Unspecified atrial flutter: Secondary | ICD-10-CM | POA: Diagnosis present

## 2021-02-08 HISTORY — PX: A-FLUTTER ABLATION: EP1230

## 2021-02-08 SURGERY — A-FLUTTER ABLATION

## 2021-02-08 MED ORDER — HEPARIN (PORCINE) IN NACL 1000-0.9 UT/500ML-% IV SOLN
INTRAVENOUS | Status: DC | PRN
  Administered 2021-02-08: 500 mL

## 2021-02-08 MED ORDER — GLUCOSAMINE-CHONDROITIN 500-400 MG PO TABS: 1.0000 | ORAL_TABLET | Freq: Every morning | ORAL | Status: DC

## 2021-02-08 MED ORDER — CINNAMON 500 MG PO CAPS: 500.0000 mg | ORAL_CAPSULE | Freq: Every morning | ORAL | Status: DC

## 2021-02-08 MED ORDER — APIXABAN 5 MG PO TABS
5.0000 mg | ORAL_TABLET | Freq: Two times a day (BID) | ORAL | Status: DC
  Administered 2021-02-08 – 2021-02-09 (×2): 5 mg via ORAL
  Filled 2021-02-08 (×2): qty 1

## 2021-02-08 MED ORDER — BUPIVACAINE HCL (PF) 0.25 % IJ SOLN
INTRAMUSCULAR | Status: AC
  Filled 2021-02-08: qty 30

## 2021-02-08 MED ORDER — TAMSULOSIN HCL 0.4 MG PO CAPS
0.4000 mg | ORAL_CAPSULE | Freq: Every evening | ORAL | Status: DC
  Administered 2021-02-08: 0.4 mg via ORAL
  Filled 2021-02-08: qty 1

## 2021-02-08 MED ORDER — VIBEGRON 75 MG PO TABS: 75.0000 mg | ORAL_TABLET | Freq: Every morning | ORAL | Status: DC

## 2021-02-08 MED ORDER — SODIUM CHLORIDE 0.9 % IV SOLN: 250.0000 mL | INTRAVENOUS | Status: DC | PRN

## 2021-02-08 MED ORDER — SODIUM CHLORIDE 0.9 % IV SOLN: INTRAVENOUS | Status: DC

## 2021-02-08 MED ORDER — MELATONIN 3 MG PO TABS
3.0000 mg | ORAL_TABLET | Freq: Every day | ORAL | Status: DC
  Administered 2021-02-08: 3 mg via ORAL
  Filled 2021-02-08: qty 1

## 2021-02-08 MED ORDER — BUPIVACAINE HCL (PF) 0.25 % IJ SOLN
INTRAMUSCULAR | Status: DC | PRN
  Administered 2021-02-08: 60 mL

## 2021-02-08 MED ORDER — SODIUM CHLORIDE 0.9% FLUSH: 3.0000 mL | INTRAVENOUS | Status: DC | PRN

## 2021-02-08 MED ORDER — ADULT MULTIVITAMIN W/MINERALS CH
1.0000 | ORAL_TABLET | Freq: Every morning | ORAL | Status: DC
  Administered 2021-02-09: 1 via ORAL
  Filled 2021-02-08: qty 1

## 2021-02-08 MED ORDER — MIDAZOLAM HCL 5 MG/5ML IJ SOLN
INTRAMUSCULAR | Status: AC
  Filled 2021-02-08: qty 5

## 2021-02-08 MED ORDER — SODIUM CHLORIDE 0.9% FLUSH
3.0000 mL | Freq: Two times a day (BID) | INTRAVENOUS | Status: DC
  Administered 2021-02-08 – 2021-02-09 (×2): 3 mL via INTRAVENOUS

## 2021-02-08 MED ORDER — FENTANYL CITRATE (PF) 100 MCG/2ML IJ SOLN
INTRAMUSCULAR | Status: AC
  Filled 2021-02-08: qty 2

## 2021-02-08 MED ORDER — ASCORBIC ACID 500 MG PO TABS
1000.0000 mg | ORAL_TABLET | Freq: Every morning | ORAL | Status: DC
  Administered 2021-02-09: 1000 mg via ORAL
  Filled 2021-02-08: qty 2

## 2021-02-08 MED ORDER — FENTANYL CITRATE (PF) 100 MCG/2ML IJ SOLN
INTRAMUSCULAR | Status: DC | PRN
  Administered 2021-02-08: 12.5 ug via INTRAVENOUS
  Administered 2021-02-08 (×2): 25 ug via INTRAVENOUS
  Administered 2021-02-08 (×5): 12.5 ug via INTRAVENOUS

## 2021-02-08 MED ORDER — TURMERIC 500 MG PO CAPS: 500.0000 mg | ORAL_CAPSULE | Freq: Every morning | ORAL | Status: DC

## 2021-02-08 MED ORDER — GABAPENTIN 300 MG PO CAPS
300.0000 mg | ORAL_CAPSULE | Freq: Three times a day (TID) | ORAL | Status: DC
  Administered 2021-02-08 – 2021-02-09 (×2): 300 mg via ORAL
  Filled 2021-02-08 (×2): qty 1

## 2021-02-08 MED ORDER — ONDANSETRON HCL 4 MG/2ML IJ SOLN: 4.0000 mg | Freq: Four times a day (QID) | INTRAMUSCULAR | Status: DC | PRN

## 2021-02-08 MED ORDER — MIDAZOLAM HCL 5 MG/5ML IJ SOLN
INTRAMUSCULAR | Status: DC | PRN
  Administered 2021-02-08 (×3): 1 mg via INTRAVENOUS
  Administered 2021-02-08: 2 mg via INTRAVENOUS
  Administered 2021-02-08 (×3): 1 mg via INTRAVENOUS
  Administered 2021-02-08: 2 mg via INTRAVENOUS

## 2021-02-08 MED ORDER — ACETAMINOPHEN 325 MG PO TABS: 650.0000 mg | ORAL_TABLET | ORAL | Status: DC | PRN

## 2021-02-08 SURGICAL SUPPLY — 12 items
BAG SNAP BAND KOVER 36X36 (MISCELLANEOUS) ×1 IMPLANT
CATH BLAZERPRIME XP (ABLATOR) ×1 IMPLANT
CATH DUODECA HALO/ISMUS 7FR (CATHETERS) ×1 IMPLANT
CATH JOSEPH QUAD ALLRED 6F REP (CATHETERS) ×1 IMPLANT
CATH POLARIS X REPROCESSED (CATHETERS) ×1 IMPLANT
MAT PREVALON FULL STRYKER (MISCELLANEOUS) ×1 IMPLANT
PACK EP LATEX FREE (CUSTOM PROCEDURE TRAY) ×2
PACK EP LF (CUSTOM PROCEDURE TRAY) ×1 IMPLANT
PAD DEFIB RADIO PHYSIO CONN (PAD) ×2 IMPLANT
SHEATH PINNACLE 6F 10CM (SHEATH) ×1 IMPLANT
SHEATH PINNACLE 7F 10CM (SHEATH) ×1 IMPLANT
SHEATH PINNACLE 8F 10CM (SHEATH) ×2 IMPLANT

## 2021-02-08 NOTE — Discharge Instructions (Signed)
Post procedure care instructions No driving for 4 days. No lifting over 5 lbs for 1 week. No vigorous or sexual activity for 1 week. You may return to work/your usual activities on 02/17/20. Keep procedure site clean & dry. If you notice increased pain, swelling, bleeding or pus, call/return!  You may shower after 24 hours, but no soaking in baths/hot tubs/pools for 1 week.

## 2021-02-08 NOTE — Plan of Care (Signed)
°  Problem: Education: °Goal: Knowledge of General Education information will improve °Description: Including pain rating scale, medication(s)/side effects and non-pharmacologic comfort measures °Outcome: Progressing °  °Problem: Clinical Measurements: °Goal: Ability to maintain clinical measurements within normal limits will improve °Outcome: Progressing °  °Problem: Clinical Measurements: °Goal: Cardiovascular complication will be avoided °Outcome: Progressing °  °Problem: Pain Managment: °Goal: General experience of comfort will improve °Outcome: Progressing °  °Problem: Safety: °Goal: Ability to remain free from injury will improve °Outcome: Progressing °  °Problem: Skin Integrity: °Goal: Risk for impaired skin integrity will decrease °Outcome: Progressing °  °

## 2021-02-08 NOTE — Progress Notes (Signed)
Site area: Removed 7 [place], 8 [place] x2 shesths from right femoral vein.    Site Prior to Removal:  Level 0   Pressure Applied For: 20 MINUTES     Bedrest Beginning at 1635   Manual:   Yes.     Patient Status During Pull:  stable   Post Pull Groin Site:  Level 0   Post Pull Instructions Given:  Yes.     Post Pull Pulses Present:  Yes.     Dressing Applied:  Yes.     Comments:     Distal pulses palpable

## 2021-02-08 NOTE — Progress Notes (Signed)
PHARMACIST - PHYSICIAN ORDER COMMUNICATION  CONCERNING: P&T Medication Policy on Herbal Medications  DESCRIPTION:  This patients orders for:  cinnamon, tumeric, and glucosamine-chondroitin  have been noted.  These products are classified as an herbal or natural product. Due to a lack of definitive safety studies or FDA approval, nonstandard manufacturing practices, plus the potential risk of unknown drug-drug interactions while on inpatient medications, the Pharmacy and Therapeutics Committee does not permit the use of herbal or natural products of this type within [facility].   ACTION TAKEN: The pharmacy department is unable to verify this order at this time and your patient has been informed of this safety policy. Please reevaluate patients clinical condition at discharge and address if the herbal or natural product(s) should be resumed at that time.  Thank you for including pharmacy in the care of this patient.  Lissa Merlin, PharmD PGY1 Acute Care Pharmacy Resident  Phone: [phone] 02/08/2021  6:58 PM  Please check [email or web address] for unit-specific pharmacy phone numbers.

## 2021-02-08 NOTE — Interval H&P Note (Signed)
History and Physical Interval Note:  02/08/2021 2:56 PM  Kenneth Melendez  has presented today for surgery, with the diagnosis of aflutter.  The various methods of treatment have been discussed with the patient and family. After consideration of risks, benefits and other options for treatment, the patient has consented to  Procedure(s): A-FLUTTER ABLATION (N/A) as a surgical intervention.  The patient's history has been reviewed, patient examined, no change in status, stable for surgery.  I have reviewed the patient's chart and labs.  Questions were answered to the patient's satisfaction.     Lewayne Bunting

## 2021-02-09 ENCOUNTER — Encounter (HOSPITAL_COMMUNITY): Payer: Self-pay | Admitting: Internal Medicine

## 2021-02-09 DIAGNOSIS — Z7901 Long term (current) use of anticoagulants: Secondary | ICD-10-CM | POA: Diagnosis not present

## 2021-02-09 DIAGNOSIS — I10 Essential (primary) hypertension: Secondary | ICD-10-CM | POA: Diagnosis not present

## 2021-02-09 DIAGNOSIS — I495 Sick sinus syndrome: Secondary | ICD-10-CM | POA: Diagnosis not present

## 2021-02-09 DIAGNOSIS — I483 Typical atrial flutter: Secondary | ICD-10-CM

## 2021-02-09 DIAGNOSIS — G43909 Migraine, unspecified, not intractable, without status migrainosus: Secondary | ICD-10-CM | POA: Diagnosis not present

## 2021-02-09 DIAGNOSIS — E785 Hyperlipidemia, unspecified: Secondary | ICD-10-CM | POA: Diagnosis not present

## 2021-02-09 NOTE — Discharge Summary (Addendum)
ELECTROPHYSIOLOGY PROCEDURE DISCHARGE SUMMARY    Patient ID: Kenneth Melendez,  MRN: [id number], DOB/AGE: March 19, 1955 66 y.o.  Admit date: 02/08/2021 Discharge date: 02/09/2021  Primary Care Physician: [place], MD Primary Cardiologist: Dr. Katrinka Blazing Electrophysiologist: Dr. Ladona Ridgel  Primary Discharge Diagnosis:  Atrial flutter status post ablation this admission  Secondary Discharge Diagnosis:  1.HTN 2.HLD 3.Migraine HAs  No Known Allergies   Procedures This Admission: 1.  Electrophysiology study and radiofrequency catheter ablation on 1.3.22 by Dr Ladona Ridgel.    Conclusion: Successful EP study and catheter ablation of the atrial flutter isthmus and the patient with recurrent typical atrial flutter.  Following catheter ablation there were no inducible sustained arrhythmias.  The patient did have residual sinus node dysfunction but not severe enough to recommend permanent pacemaker insertion at this time  Brief HPI: Kenneth Melendez is a 66 y.o. male with a past medical history as outlined above.  He has documented atrial flutter. He has been appropriately anticoagulated for >4 weeks.  Risks, benefits, and alternatives to ablation were reviewed with the patient who wished to proceed.   Hospital Course:  The patient was admitted and underwent EPS/RFCA with details as outlined above. He was monitored on telemetry overnight which demonstrated SB generally 50's.  Groin site is stable and without complication.  They were examined by Dr Ladona Ridgel who considered them stable for discharge to home.  Follow up will be arranged in 4 weeks.  Wound care and restrictions were reviewed with the patient prior to discharge.   We will stop his metoprolol given SB and continue his losartan, the patient unclear about how he was taking this medicine, I asked him to confirm once home and if any changes/questions to reach out to the office for clarification, it is prescribed once daily though he thought he  was taking it BID, also mentioned that we not give him the "long acting kind, so suspect he is confusing the metoprolol.  Physical Exam: Vitals:   02/08/21 1745 02/08/21 1824 02/08/21 2155 02/09/21 0535  BP: 133/81 132/77 (!) 146/79 120/62  Pulse: (!) 40 (!) 38 (!) 50 (!) 45  Resp: 16 16 19 16   Temp:  (!) 97.5 F (36.4 C) 98.1 F (36.7 C) 97.8 F (36.6 C)  TempSrc:  Oral Oral Oral  SpO2: 98%  95%   Weight:      Height:        GEN- The patient is well appearing, alert and oriented x 3 today.   HEENT: normocephalic, atraumatic; sclera clear, conjunctiva pink; hearing intact; oropharynx clear; neck supple, no JVP Lymph- no cervical lymphadenopathy Lungs- CTA b/l, normal work of breathing.  No wheezes, rales, rhonchi Heart- RRR, bradycardia, no murmurs, rubs or gallops, PMI not laterally displaced GI- soft, non-tender, non-distended Extremities- no clubbing, cyanosis, or edema; R groin without hematoma/bruit MS- no significant deformity or atrophy Skin- warm and dry, no rash or lesion Psych- euthymic mood, full affect Neuro- strength and sensation are intact   Labs:   Lab Results  Component Value Date   WBC 4.9 11/26/2020   HGB 13.8 11/26/2020   HCT 42.5 11/26/2020   MCV 96.6 11/26/2020   PLT 243 11/26/2020   No results for input(s): NA, K, CL, CO2, BUN, CREATININE, CALCIUM, PROT, BILITOT, ALKPHOS, ALT, AST, GLUCOSE in the last 168 hours.  Invalid input(s): LABALBU  Discharge Medications:  Allergies as of 02/09/2021   No Known Allergies      Medication List  STOP taking these medications    metoprolol succinate 50 MG 24 hr tablet Commonly known as: TOPROL-XL       TAKE these medications    atorvastatin 10 MG tablet Commonly known as: LIPITOR TAKE 1 TABLET BY MOUTH ONCE DAILY   BIOTIN PO Take 1 tablet by mouth in the morning.   Cinnamon 500 MG capsule Take 500 mg by mouth in the morning.   Eliquis 5 MG Tabs tablet Generic drug: apixaban Take 1  tablet (5 mg total) by mouth 2 (two) times daily.   Fluad Quadrivalent 0.5 ML injection Generic drug: influenza vaccine adjuvanted Inject into the muscle.   gabapentin 300 MG capsule Commonly known as: NEURONTIN Take 1 capsule (300 mg total) by mouth 3 (three) times daily   Gemtesa 75 MG Tabs Generic drug: Vibegron Take 75 mg by mouth in the morning.   glucosamine-chondroitin 500-400 MG tablet Take 1 tablet by mouth in the morning.   losartan 25 MG tablet Commonly known as: COZAAR Take 1 tablet by mouth daily. What changed: when to take this Notes to patient: Please confirm once home how you are prescribed and taking this medicine.  Reach out to the office if any questions.   multivitamin with minerals Tabs tablet Take 1 tablet by mouth in the morning.   ondansetron 4 MG disintegrating tablet Commonly known as: ZOFRAN-ODT Take 1 tablet by mouth 1 - 3 times daily   Pfizer COVID-19 Vac Bivalent injection Generic drug: COVID-19 mRNA bivalent vaccine (Pfizer) Inject into the muscle.   Pfizer-BioNT COVID-19 Vac-TriS Susp injection Generic drug: COVID-19 mRNA Vac-TriS (Pfizer) Inject into the muscle.   tamsulosin 0.4 MG Caps capsule Commonly known as: FLOMAX Take 0.4 mg by mouth every evening.   Turmeric 500 MG Caps Take 500 mg by mouth in the morning.   vitamin C 1000 MG tablet Take 1,000 mg by mouth in the morning.        Disposition: Home Discharge Instructions     Diet - low sodium heart healthy   Complete by: As directed    Increase activity slowly   Complete by: As directed         Duration of Discharge Encounter: Greater than 30 minutes including physician time.  Norma Fredrickson, PA-C 02/09/2021 11:02 AM  EP attending  Patient seen and examined.  Agree with the findings as noted above.  The patient is doing very well after catheter ablation of the atrial flutter isthmus in a patient with recurrent typical atrial flutter.  His heart rate is  improved.  Blood pressure is stable.  His groin demonstrates no hematoma.  Telemetry demonstrates sinus rhythm and sinus bradycardia.  He will be discharged home with usual follow-up.  He will continue his current medications except his beta-blocker will be held secondary to relative sinus node dysfunction.  If he returns without any evidence of recurrent atrial arrhythmias, we will plan to stop his systemic anticoagulation as an outpatient.  Sharrell Ku, MD

## 2021-02-09 NOTE — Plan of Care (Signed)

## 2021-02-11 ENCOUNTER — Institutional Professional Consult (permissible substitution): Payer: Federal, State, Local not specified - PPO | Admitting: Internal Medicine

## 2021-02-11 ENCOUNTER — Encounter: Payer: Self-pay | Admitting: Internal Medicine

## 2021-02-13 NOTE — Procedures (Signed)
° °  Patient Name: Kenneth Melendez, Kenneth Melendez Date:02/02/2021 Gender: Male D.O.B: 11/18/1955 Age (years): 21 Referring Provider: Daneen Schick, MD Height (inches): 75 Interpreting Physician: Fransico Him MD, ABSM Weight (lbs): 229 RPSGT: [place], [place] BMI: 29 MRN: [id number] Neck Size: 15.50  CLINICAL INFORMATION Sleep Study Type: NPSG  Indication for sleep study: Hypertension, Snoring  Epworth Sleepiness Score: N/A  SLEEP STUDY TECHNIQUE As per the AASM Manual for the Scoring of Sleep and Associated Events v2.3 (April 2016) with a hypopnea requiring 4% desaturations.  The channels recorded and monitored were frontal, central and occipital EEG, electrooculogram (EOG), submentalis EMG (chin), nasal and oral airflow, thoracic and abdominal wall motion, anterior tibialis EMG, snore microphone, electrocardiogram, and pulse oximetry.  MEDICATIONS Medications self-administered by patient taken the night of the study : N/A  SLEEP ARCHITECTURE The study was initiated at 10:55:14 PM and ended at 4:57:16 AM.  Sleep onset time was 0.0 minutes and the sleep efficiency was 98.5%. The total sleep time was 356.7 minutes.  Stage REM latency was 41.2 minutes.  The patient spent 3.4% of the night in stage N1 sleep, 71.4% in stage N2 sleep, 0.0% in stage N3 and 25.2% in REM.  Alpha intrusion was absent.  Supine sleep was 22.48%.  RESPIRATORY PARAMETERS The overall apnea/hypopnea index (AHI) was 3.4 per hour. There were 9 total apneas, including 8 obstructive, 1 central and 0 mixed apneas. There were 11 hypopneas and 13 RERAs.  The AHI during Stage REM sleep was 10.0 per hour.  AHI while supine was 3.7 per hour.  The mean oxygen saturation was 94.2%. The minimum SpO2 during sleep was 86.0%.  loud snoring was noted during this study.  CARDIAC DATA The 2 lead EKG demonstrated sinus rhythm. The mean heart rate was 42.6 beats per minute. Other EKG findings include: None.  LEG MOVEMENT  DATA The total PLMS were 0 with a resulting PLMS index of 0.0. Associated arousal with leg movement index was 0.0 .  IMPRESSIONS - No significant obstructive sleep apnea occurred during this study (AHI = 3.4/h). - No significant central sleep apnea occurred during this study (CAI = 0.2/h). - Mild oxygen desaturation was noted during this study (Min O2 = 86.0%). - The patient snored with loud snoring volume. - No cardiac abnormalities were noted during this study. - Clinically significant periodic limb movements did not occur during sleep. No significant associated arousals.  DIAGNOSIS - Normal Study  RECOMMENDATIONS - Avoid alcohol, sedatives and other CNS depressants that may worsen sleep apnea and disrupt normal sleep architecture. - Sleep hygiene should be reviewed to assess factors that may improve sleep quality. - Weight management and regular exercise should be initiated or continued if appropriate.  [Electronically signed] 02/13/2021 06:52 PM  Fransico Him MD, ABSM Diplomate, American Board of Sleep Medicine

## 2021-02-14 ENCOUNTER — Other Ambulatory Visit (HOSPITAL_BASED_OUTPATIENT_CLINIC_OR_DEPARTMENT_OTHER): Payer: Self-pay

## 2021-02-14 ENCOUNTER — Other Ambulatory Visit: Payer: Self-pay | Admitting: Interventional Cardiology

## 2021-02-14 MED ORDER — APIXABAN 5 MG PO TABS
5.0000 mg | ORAL_TABLET | Freq: Two times a day (BID) | ORAL | 10 refills | Status: DC
  Filled 2021-02-14: qty 60, 30d supply, fill #0

## 2021-02-14 NOTE — Telephone Encounter (Signed)
Pt last saw Dr Lovena Le 01/26/21, last labs 01/20/21 Creat 0.88, age 66, weight 103.9kg, based on specified criteria pt is on appropriate dosage of Eliquis 5mg  BID for aflutter.  Will refill rx.

## 2021-02-15 ENCOUNTER — Other Ambulatory Visit (HOSPITAL_BASED_OUTPATIENT_CLINIC_OR_DEPARTMENT_OTHER): Payer: Self-pay

## 2021-02-21 DIAGNOSIS — G43009 Migraine without aura, not intractable, without status migrainosus: Secondary | ICD-10-CM | POA: Diagnosis not present

## 2021-02-21 DIAGNOSIS — E782 Mixed hyperlipidemia: Secondary | ICD-10-CM | POA: Diagnosis not present

## 2021-02-21 DIAGNOSIS — I10 Essential (primary) hypertension: Secondary | ICD-10-CM | POA: Diagnosis not present

## 2021-02-24 ENCOUNTER — Other Ambulatory Visit (HOSPITAL_BASED_OUTPATIENT_CLINIC_OR_DEPARTMENT_OTHER): Payer: Self-pay

## 2021-02-25 ENCOUNTER — Other Ambulatory Visit (HOSPITAL_BASED_OUTPATIENT_CLINIC_OR_DEPARTMENT_OTHER): Payer: Self-pay

## 2021-02-25 MED ORDER — ATORVASTATIN CALCIUM 10 MG PO TABS
ORAL_TABLET | ORAL | 1 refills | Status: DC
  Filled 2021-02-25: qty 90, 90d supply, fill #0
  Filled 2021-05-19: qty 90, 90d supply, fill #1

## 2021-03-02 NOTE — Progress Notes (Signed)
Spoke with patient and gave him sleep study results per Dr Mayford Knife. He verbalized his understanding and thanked me for the call.

## 2021-03-07 ENCOUNTER — Other Ambulatory Visit: Payer: Self-pay

## 2021-03-07 ENCOUNTER — Ambulatory Visit: Payer: Federal, State, Local not specified - PPO | Admitting: Internal Medicine

## 2021-03-07 ENCOUNTER — Encounter: Payer: Self-pay | Admitting: Internal Medicine

## 2021-03-07 VITALS — BP 108/62 | HR 58 | Ht 76.0 in | Wt 240.0 lb

## 2021-03-07 DIAGNOSIS — I483 Typical atrial flutter: Secondary | ICD-10-CM

## 2021-03-07 NOTE — Progress Notes (Signed)
HPI [name] returns for evaluation of atrial flutter s/p catheter ablation. He is a pleasant 66 yo man with a h/o HTN who developed atrial flutter and underwent catheter ablation after several cardioversions for recurrent flutter. He has not had anymore symptoms. He He feels tired when out of rhythm and denies chest pain or sob. He has purchased an Apple watch and notes that he has not had any heart racing. He does have nocturnal bradycardia.  No Known Allergies   Current Outpatient Medications  Medication Sig Dispense Refill   apixaban (ELIQUIS) 5 MG TABS tablet Take 1 tablet (5 mg total) by mouth 2 (two) times daily. 60 tablet 10   Ascorbic Acid (VITAMIN C) 1000 MG tablet Take 1,000 mg by mouth in the morning.     atorvastatin (LIPITOR) 10 MG tablet TAKE 1 TABLET BY MOUTH ONCE DAILY 90 tablet 3   atorvastatin (LIPITOR) 10 MG tablet Take 1 tablet by mouth once daily 90 tablet 1   BIOTIN PO Take 1 tablet by mouth in the morning.     Cinnamon 500 MG capsule Take 500 mg by mouth in the morning.     COVID-19 mRNA bivalent vaccine, Pfizer, (PFIZER COVID-19 VAC BIVALENT) injection Inject into the muscle. 0.3 mL 0   COVID-19 mRNA Vac-TriS, Pfizer, (PFIZER-BIONT COVID-19 VAC-TRIS) SUSP injection Inject into the muscle. 0.3 mL 0   gabapentin (NEURONTIN) 300 MG capsule Take 1 capsule (300 mg total) by mouth 3 (three) times daily 90 capsule 11   GEMTESA 75 MG TABS Take 75 mg by mouth in the morning.     glucosamine-chondroitin 500-400 MG tablet Take 1 tablet by mouth in the morning.     influenza vaccine adjuvanted (FLUAD) 0.5 ML injection Inject into the muscle. 0.5 mL 0   losartan (COZAAR) 25 MG tablet Take 1 tablet by mouth daily. (Patient taking differently: Take 25 mg by mouth in the morning and at bedtime.) 30 tablet 3   Multiple Vitamin (MULTIVITAMIN WITH MINERALS) TABS tablet Take 1 tablet by mouth in the morning.     ondansetron (ZOFRAN-ODT) 4 MG disintegrating tablet Take 1 tablet  by mouth 1 - 3 times daily 30 tablet 3   sildenafil (VIAGRA) 100 MG tablet Take 1 tablet by mouth as needed for erectile dysfunction.     tamsulosin (FLOMAX) 0.4 MG CAPS capsule Take 0.4 mg by mouth every evening.  1   Turmeric 500 MG CAPS Take 500 mg by mouth in the morning.     No current facility-administered medications for this visit.     Past Medical History:  Diagnosis Date   Erectile dysfunction    Hemorrhoids    Hyperlipidemia    Hypertension    Migraine    followed by [facility]   Pre-diabetes    Urethral stricture     ROS:   All systems reviewed and negative except as noted in the HPI.   Past Surgical History:  Procedure Laterality Date   A-FLUTTER ABLATION N/A 02/08/2021   Procedure: A-FLUTTER ABLATION;  Surgeon: Marinus Maw, MD;  Location: [facility] INVASIVE CV LAB;  Service: Cardiovascular;  Laterality: N/A;   APPENDECTOMY  1970's   BUNIONECTOMY Right 2005  approx   COLONOSCOPY  04/20/2009   CYSTOSCOPY WITH URETHRAL DILATATION N/A 01/17/2016   Procedure: CYSTOSCOPY WITH URETHRAL  DILATATION;  Surgeon: [name], MD;  Location: [facility];  Service: Urology;  Laterality: N/A;   HEMORRHOIDECTOMY WITH HEMORRHOID BANDING  06/14/2010   INGUINAL HERNIA REPAIR Left 1996  approx   SHOULDER ARTHROSCOPY  01/02/2011   Procedure: ARTHROSCOPY SHOULDER;  Surgeon: [name];  Location: [facility] OR;  Service: Orthopedics;  Laterality: Left;   SUPRAUMBILICAL HERNIA REPAIR W/ MESH  05/14/2009     Family History  Problem Relation Age of Onset   Heart attack Father    Diabetes Mother    Heart attack Mother    Heart disease Mother    Hypotension Mother    Anesthesia problems Neg Hx    Malignant hyperthermia Neg Hx    Pseudochol deficiency Neg Hx      Social History   Socioeconomic History   Marital status: Married    Spouse name: Not on file   Number of children: Not on file   Years of education: Not on file   Highest education  level: Not on file  Occupational History   Occupation: Retired   Tobacco Use   Smoking status: Never   Smokeless tobacco: Never  Substance and Sexual Activity   Alcohol use: Yes    Alcohol/week: 0.0 standard drinks    Comment: social    Drug use: No   Sexual activity: Not on file  Other Topics Concern   Not on file  Social History Narrative   Not on file   Social Determinants of Health   Financial Resource Strain: Not on file  Food Insecurity: Not on file  Transportation Needs: Not on file  Physical Activity: Not on file  Stress: Not on file  Social Connections: Not on file  Intimate Partner Violence: Not on file     BP 108/62    Pulse (!) 58    Ht 6\' 4"  (1.93 m)    Wt 240 lb (108.9 kg)    SpO2 94%    BMI 29.21 kg/m   Physical Exam:  Well appearing 66 yo man, NAD HEENT: Unremarkable Neck:  No JVD, no thyromegally Lymphatics:  No adenopathy Back:  No CVA tenderness Lungs:  Clear with no wheezes HEART:  Regular rate rhythm, no murmurs, no rubs, no clicks Abd:  soft, positive bowel sounds, no organomegally, no rebound, no guarding Ext:  2 plus pulses, no edema, no cyanosis, no clubbing Skin:  No rashes no nodules Neuro:  CN II through XII intact, motor grossly intact  EKG - sinus brady at 58/min  Assess/Plan:   Atrial flutter - he has undergone catheter ablation and appears to be doing well with out recurrent flutter.  Coags - he will discontinue his eliquis. If he develops any heart racing he will notify 79.  HTN -his bp is good today. I asked him to make a log of his pressures.    [place] Luzelena Heeg,MD

## 2021-03-07 NOTE — Patient Instructions (Signed)
Medication Instructions:  Your physician recommends that you continue on your current medications as directed. Please refer to the Current Medication list given to you today. *If you need a refill on your cardiac medications before your next appointment, please call your pharmacy*  Lab Work: None. If you have labs (blood work) drawn today and your tests are completely normal, you will receive your results only by: MyChart Message (if you have MyChart) OR A paper copy in the mail If you have any lab test that is abnormal or we need to change your treatment, we will call you to review the results.  Testing/Procedures: None.  Follow-Up: At [facility], you and your health needs are our priority.  As part of our continuing mission to provide you with exceptional heart care, we have created designated Provider Care Teams.  These Care Teams include your primary Cardiologist (physician) and Advanced Practice Providers (APPs -  Physician Assistants and Nurse Practitioners) who all work together to provide you with the care you need, when you need it.  Your physician wants you to follow-up in: As needed with Lewayne Bunting, MD     You will receive a reminder letter in the mail two months in advance. If you don't receive a letter, please call our office to schedule the follow-up appointment.  We recommend signing up for the patient portal called "MyChart".  Sign up information is provided on this After Visit Summary.  MyChart is used to connect with patients for Virtual Visits (Telemedicine).  Patients are able to view lab/test results, encounter notes, upcoming appointments, etc.  Non-urgent messages can be sent to your provider as well.   To learn more about what you can do with MyChart, go to [email or web address].    Any Other Special Instructions Will Be Listed Below (If Applicable).

## 2021-03-10 ENCOUNTER — Ambulatory Visit: Payer: Federal, State, Local not specified - PPO | Admitting: Internal Medicine

## 2021-03-15 DIAGNOSIS — M5416 Radiculopathy, lumbar region: Secondary | ICD-10-CM | POA: Diagnosis not present

## 2021-03-15 DIAGNOSIS — M5459 Other low back pain: Secondary | ICD-10-CM | POA: Diagnosis not present

## 2021-03-18 DIAGNOSIS — M5416 Radiculopathy, lumbar region: Secondary | ICD-10-CM | POA: Diagnosis not present

## 2021-03-18 DIAGNOSIS — M5459 Other low back pain: Secondary | ICD-10-CM | POA: Diagnosis not present

## 2021-03-21 DIAGNOSIS — M5459 Other low back pain: Secondary | ICD-10-CM | POA: Diagnosis not present

## 2021-03-21 DIAGNOSIS — M5416 Radiculopathy, lumbar region: Secondary | ICD-10-CM | POA: Diagnosis not present

## 2021-03-25 DIAGNOSIS — M5459 Other low back pain: Secondary | ICD-10-CM | POA: Diagnosis not present

## 2021-03-25 DIAGNOSIS — M5416 Radiculopathy, lumbar region: Secondary | ICD-10-CM | POA: Diagnosis not present

## 2021-04-06 DIAGNOSIS — M545 Low back pain, unspecified: Secondary | ICD-10-CM | POA: Diagnosis not present

## 2021-04-06 DIAGNOSIS — M2560 Stiffness of unspecified joint, not elsewhere classified: Secondary | ICD-10-CM | POA: Diagnosis not present

## 2021-04-06 DIAGNOSIS — M6281 Muscle weakness (generalized): Secondary | ICD-10-CM | POA: Diagnosis not present

## 2021-04-08 ENCOUNTER — Emergency Department (HOSPITAL_BASED_OUTPATIENT_CLINIC_OR_DEPARTMENT_OTHER): Payer: Federal, State, Local not specified - PPO

## 2021-04-08 ENCOUNTER — Encounter (HOSPITAL_BASED_OUTPATIENT_CLINIC_OR_DEPARTMENT_OTHER): Payer: Self-pay | Admitting: *Deleted

## 2021-04-08 ENCOUNTER — Other Ambulatory Visit (HOSPITAL_BASED_OUTPATIENT_CLINIC_OR_DEPARTMENT_OTHER): Payer: Self-pay

## 2021-04-08 ENCOUNTER — Emergency Department (HOSPITAL_BASED_OUTPATIENT_CLINIC_OR_DEPARTMENT_OTHER)
Admission: EM | Admit: 2021-04-08 | Discharge: 2021-04-08 | Disposition: A | Discharge status: Home / Self Care | Payer: Federal, State, Local not specified - PPO | Attending: Emergency Medicine | Admitting: Emergency Medicine

## 2021-04-08 ENCOUNTER — Other Ambulatory Visit: Payer: Self-pay

## 2021-04-08 DIAGNOSIS — M5136 Other intervertebral disc degeneration, lumbar region: Secondary | ICD-10-CM | POA: Diagnosis not present

## 2021-04-08 DIAGNOSIS — S0093XA Contusion of unspecified part of head, initial encounter: Secondary | ICD-10-CM | POA: Insufficient documentation

## 2021-04-08 DIAGNOSIS — Z7901 Long term (current) use of anticoagulants: Secondary | ICD-10-CM | POA: Insufficient documentation

## 2021-04-08 DIAGNOSIS — I4892 Unspecified atrial flutter: Secondary | ICD-10-CM | POA: Insufficient documentation

## 2021-04-08 DIAGNOSIS — W01198A Fall on same level from slipping, tripping and stumbling with subsequent striking against other object, initial encounter: Secondary | ICD-10-CM | POA: Diagnosis not present

## 2021-04-08 DIAGNOSIS — Z79899 Other long term (current) drug therapy: Secondary | ICD-10-CM | POA: Insufficient documentation

## 2021-04-08 DIAGNOSIS — S39012A Strain of muscle, fascia and tendon of lower back, initial encounter: Secondary | ICD-10-CM | POA: Insufficient documentation

## 2021-04-08 DIAGNOSIS — I10 Essential (primary) hypertension: Secondary | ICD-10-CM | POA: Insufficient documentation

## 2021-04-08 DIAGNOSIS — Z043 Encounter for examination and observation following other accident: Secondary | ICD-10-CM | POA: Diagnosis not present

## 2021-04-08 DIAGNOSIS — W010XXA Fall on same level from slipping, tripping and stumbling without subsequent striking against object, initial encounter: Secondary | ICD-10-CM

## 2021-04-08 DIAGNOSIS — S0990XA Unspecified injury of head, initial encounter: Secondary | ICD-10-CM | POA: Diagnosis not present

## 2021-04-08 DIAGNOSIS — S0083XA Contusion of other part of head, initial encounter: Secondary | ICD-10-CM | POA: Diagnosis not present

## 2021-04-08 MED ORDER — ACETAMINOPHEN 325 MG PO TABS: 650.0000 mg | ORAL_TABLET | Freq: Once | ORAL | Status: DC

## 2021-04-08 MED ORDER — METHOCARBAMOL 750 MG PO TABS
750.0000 mg | ORAL_TABLET | Freq: Three times a day (TID) | ORAL | 0 refills | Status: DC | PRN
  Filled 2021-04-08: qty 15, 5d supply, fill #0

## 2021-04-08 NOTE — ED Triage Notes (Signed)
He slipped on a wet floor yesterday. He hit the back of his head and his back. Painful today. ?

## 2021-04-08 NOTE — ED Provider Notes (Signed)
[facility] EMERGENCY DEPARTMENT Provider Note   CSN: [id number] Arrival date & time: 04/08/21  1406     History  Chief Complaint  Patient presents with   Kenneth Melendez is a 66 y.o. male.  Patient c/o fall yesterday. States was spraying cleaning product on floor, and spilled, making floor slippery and wearing slippery footwear - states feet slipped and he fell back, hitting head. No loc. Able to get up under own power and ambulatory since. Mild dull headache post head contusion. Denies neck pain. +low back pain post fall. Hx ddd, prior back surgery, and back pain. No new saddle area or leg numbness. No weakness. No problems w gait. No problems w normal bowel/bladder fxn. No fever or chills. No radicular pain. Denies other pain or injury from fall. Pt denies current anticoag use (despite chart). No chest pain or sob. No abd pain or nv. Skin intact.   The history is provided by the patient and medical records.      Home Medications Prior to Admission medications   Medication Sig Start Date End Date Taking? Authorizing Provider  methocarbamol (ROBAXIN) 750 MG tablet Take 1 tablet (750 mg total) by mouth 3 (three) times daily as needed (muscle spasm/pain). 04/08/21  Yes Lajean Saver, MD  apixaban (ELIQUIS) 5 MG TABS tablet Take 1 tablet (5 mg total) by mouth 2 (two) times daily. 02/14/21   Evans Lance, MD  Ascorbic Acid (VITAMIN C) 1000 MG tablet Take 1,000 mg by mouth in the morning.    [provider]  atorvastatin (LIPITOR) 10 MG tablet TAKE 1 TABLET BY MOUTH ONCE DAILY 01/29/20 02/08/21  Benito Mccreedy, MD  atorvastatin (LIPITOR) 10 MG tablet Take 1 tablet by mouth once daily 02/25/21     BIOTIN PO Take 1 tablet by mouth in the morning.    [provider]  Cinnamon 500 MG capsule Take 500 mg by mouth in the morning.    [provider]  COVID-19 mRNA bivalent vaccine, Pfizer, (PFIZER COVID-19 VAC BIVALENT) injection Inject into the  muscle. 11/23/20   Carlyle Basques, MD  COVID-19 mRNA Vac-TriS, Pfizer, (PFIZER-BIONT COVID-19 VAC-TRIS) SUSP injection Inject into the muscle. 05/24/20   Carlyle Basques, MD  gabapentin (NEURONTIN) 300 MG capsule Take 1 capsule (300 mg total) by mouth 3 (three) times daily 07/02/20     GEMTESA 75 MG TABS Take 75 mg by mouth in the morning. 10/08/20   [provider]  glucosamine-chondroitin 500-400 MG tablet Take 1 tablet by mouth in the morning.    [provider]  influenza vaccine adjuvanted (FLUAD) 0.5 ML injection Inject into the muscle. 11/23/20   Carlyle Basques, MD  losartan (COZAAR) 25 MG tablet Take 1 tablet by mouth daily. Patient taking differently: Take 25 mg by mouth in the morning and at bedtime. 01/20/21     Multiple Vitamin (MULTIVITAMIN WITH MINERALS) TABS tablet Take 1 tablet by mouth in the morning.    [provider]  ondansetron (ZOFRAN-ODT) 4 MG disintegrating tablet Take 1 tablet by mouth 1 - 3 times daily 11/25/20     sildenafil (VIAGRA) 100 MG tablet Take 1 tablet by mouth as needed for erectile dysfunction. 01/28/21   [provider]  tamsulosin (FLOMAX) 0.4 MG CAPS capsule Take 0.4 mg by mouth every evening. 07/13/15   [provider]  Turmeric 500 MG CAPS Take 500 mg by mouth in the morning.    [provider]  Allergies    Patient has no known allergies.    Review of Systems   Review of Systems  Constitutional:  Negative for fever.  HENT:  Positive for nosebleeds.   Eyes:  Negative for pain and visual disturbance.  Respiratory:  Negative for shortness of breath.   Cardiovascular:  Negative for chest pain.  Gastrointestinal:  Negative for abdominal pain, nausea and vomiting.  Genitourinary:  Negative for flank pain and hematuria.  Musculoskeletal:  Positive for back pain. Negative for neck pain.  Skin:  Negative for wound.  Neurological:  Positive for headaches. Negative for weakness and numbness.   Hematological:  Does not bruise/bleed easily.  Psychiatric/Behavioral:  Negative for confusion.    Physical Exam Updated Vital Signs BP (!) 144/86 (BP Location: Right Arm)    Pulse (!) 56    Temp 98.5 F (36.9 C) (Oral)    Resp 18    Ht 1.93 m (6\' 4" )    Wt 108.9 kg    SpO2 95%    BMI 29.22 kg/m  Physical Exam Vitals and nursing note reviewed.  Constitutional:      Appearance: Normal appearance. He is well-developed.  HENT:     Head: Atraumatic.     Nose: Nose normal.     Mouth/Throat:     Mouth: Mucous membranes are moist.     Pharynx: Oropharynx is clear.  Eyes:     General: No scleral icterus.    Conjunctiva/sclera: Conjunctivae normal.     Pupils: Pupils are equal, round, and reactive to light.  Neck:     Trachea: No tracheal deviation.  Cardiovascular:     Rate and Rhythm: Normal rate and regular rhythm.     Pulses: Normal pulses.     Heart sounds: Normal heart sounds. No murmur heard.   No friction rub. No gallop.  Pulmonary:     Effort: Pulmonary effort is normal. No accessory muscle usage or respiratory distress.     Breath sounds: Normal breath sounds.  Chest:     Chest wall: No tenderness.  Abdominal:     General: Bowel sounds are normal. There is no distension.     Palpations: Abdomen is soft.     Tenderness: There is no abdominal tenderness.  Genitourinary:    Comments: No cva tenderness. Musculoskeletal:        General: No swelling.     Cervical back: Normal range of motion and neck supple. No rigidity or tenderness.     Comments: Lumbar tenderness, otherwise, CTLS spine, non tender, aligned, no step off. Good rom bil extremities, no focal bony tenderness noted.   Skin:    General: Skin is warm and dry.     Findings: No rash.  Neurological:     Mental Status: He is alert.     Comments: Alert, speech clear. GCS 15. Motor/sens grossly intact bil. Steady gait.   Psychiatric:        Mood and Affect: Mood normal.    ED Results / Procedures / Treatments    Labs (all labs ordered are listed, but only abnormal results are displayed) Labs Reviewed - No data to display  EKG None  Radiology DG Lumbar Spine Complete  Result Date: 04/08/2021 CLINICAL DATA:  Status post fall. EXAM: LUMBAR SPINE - COMPLETE 4+ VIEW COMPARISON:  10/03/2013. FINDINGS: The alignment of the lumbar spine is normal. The vertebral body heights are well preserved. Moderate disc space narrowing and endplate spurring is identified at the L3-4 level, increased from  previous exam. No acute fracture or dislocation identified. No radio-opaque foreign bodies are soft tissue calcifications. IMPRESSION: 1. No acute findings. 2. Moderate L3-4 degenerative disc disease. Electronically Signed   By: Kerby Moors M.D.   On: 04/08/2021 14:56   CT Head Wo Contrast  Result Date: 04/08/2021 CLINICAL DATA:  Head trauma EXAM: CT HEAD WITHOUT CONTRAST TECHNIQUE: Contiguous axial images were obtained from the base of the skull through the vertex without intravenous contrast. RADIATION DOSE REDUCTION: This exam was performed according to the departmental dose-optimization program which includes automated exposure control, adjustment of the mA and/or kV according to patient size and/or use of iterative reconstruction technique. COMPARISON:  None. FINDINGS: Brain: No evidence of acute infarction, hemorrhage, hydrocephalus, extra-axial collection or mass lesion/mass effect. Vascular: No hyperdense vessel or unexpected calcification. Skull: Normal. Negative for fracture or focal lesion. Sinuses/Orbits: No acute finding. Other: None. IMPRESSION: No acute intracranial abnormality. Electronically Signed   By: Yetta Glassman M.D.   On: 04/08/2021 14:42    Procedures Procedures    Medications Ordered in ED Medications  acetaminophen (TYLENOL) tablet 650 mg (has no administration in time range)    ED Course/ Medical Decision Making/ A&P                           Medical Decision Making Problems  Addressed: Contusion of head, initial encounter: acute illness or injury that poses a threat to life or bodily functions Essential hypertension: chronic illness or injury with exacerbation, progression, or side effects of treatment Fall from slip, trip, or stumble, initial encounter: acute illness or injury Lumbar strain, initial encounter: acute illness or injury  Amount and/or Complexity of Data Reviewed External Data Reviewed: notes. Radiology: ordered and independent interpretation performed. Decision-making details documented in ED Course.  Risk OTC drugs. Prescription drug management.   Imaging studies ordered.  Reviewed nursing notes and prior charts for additional history. External reports reviewed.   Acetaminophen po.  CT reviewed/interpreted by me - no hem.  Xrays reviewed/interpreted by me - no fx.   Rx robaxin.   Rec pcp f/u.  Return precautions provided.           Final Clinical Impression(s) / ED Diagnoses Final diagnoses:  Fall from slip, trip, or stumble, initial encounter  Contusion of head, initial encounter  Lumbar strain, initial encounter  Essential hypertension    Rx / DC Orders ED Discharge Orders          Ordered    methocarbamol (ROBAXIN) 750 MG tablet  3 times daily PRN        04/08/21 1445              Lajean Saver, MD 04/08/21 1500

## 2021-04-08 NOTE — Discharge Instructions (Signed)
It was our pleasure to provide your ER care today - we hope that you feel better. ? ?Take acetaminophen as need. You may also take robaxin as need for muscle pain/spasm - no driving when taking. ? ?Follow up with your doctor in two weeks if symptoms fail to improve/resolve. Also follow up with your doctor regarding your blood pressure which is mildly high today. ? ?Return to ER if worse, new symptoms, new/severe pain, severe headache, numbness/weakness, or other concern.  ?

## 2021-04-11 DIAGNOSIS — M6281 Muscle weakness (generalized): Secondary | ICD-10-CM | POA: Diagnosis not present

## 2021-04-11 DIAGNOSIS — M2560 Stiffness of unspecified joint, not elsewhere classified: Secondary | ICD-10-CM | POA: Diagnosis not present

## 2021-04-11 DIAGNOSIS — M545 Low back pain, unspecified: Secondary | ICD-10-CM | POA: Diagnosis not present

## 2021-04-18 ENCOUNTER — Other Ambulatory Visit (HOSPITAL_BASED_OUTPATIENT_CLINIC_OR_DEPARTMENT_OTHER): Payer: Self-pay

## 2021-04-18 DIAGNOSIS — M47816 Spondylosis without myelopathy or radiculopathy, lumbar region: Secondary | ICD-10-CM | POA: Diagnosis not present

## 2021-04-18 DIAGNOSIS — M5416 Radiculopathy, lumbar region: Secondary | ICD-10-CM | POA: Diagnosis not present

## 2021-04-18 DIAGNOSIS — M5136 Other intervertebral disc degeneration, lumbar region: Secondary | ICD-10-CM | POA: Diagnosis not present

## 2021-04-18 MED ORDER — METHOCARBAMOL 500 MG PO TABS
ORAL_TABLET | ORAL | 0 refills | Status: DC
  Filled 2021-04-18: qty 60, 15d supply, fill #0

## 2021-04-18 MED ORDER — GABAPENTIN 300 MG PO CAPS
ORAL_CAPSULE | ORAL | 3 refills | Status: DC
  Filled 2021-04-18: qty 90, 30d supply, fill #0
  Filled 2021-05-06 – 2021-06-14 (×2): qty 90, 30d supply, fill #1
  Filled 2021-07-20: qty 90, 30d supply, fill #2
  Filled 2021-08-01: qty 90, 30d supply, fill #3

## 2021-04-22 DIAGNOSIS — M48061 Spinal stenosis, lumbar region without neurogenic claudication: Secondary | ICD-10-CM | POA: Diagnosis not present

## 2021-04-22 DIAGNOSIS — M5116 Intervertebral disc disorders with radiculopathy, lumbar region: Secondary | ICD-10-CM | POA: Diagnosis not present

## 2021-04-22 DIAGNOSIS — M4316 Spondylolisthesis, lumbar region: Secondary | ICD-10-CM | POA: Diagnosis not present

## 2021-04-22 DIAGNOSIS — M4726 Other spondylosis with radiculopathy, lumbar region: Secondary | ICD-10-CM | POA: Diagnosis not present

## 2021-04-22 DIAGNOSIS — M5416 Radiculopathy, lumbar region: Secondary | ICD-10-CM | POA: Diagnosis not present

## 2021-04-25 ENCOUNTER — Other Ambulatory Visit (HOSPITAL_BASED_OUTPATIENT_CLINIC_OR_DEPARTMENT_OTHER): Payer: Self-pay

## 2021-04-25 DIAGNOSIS — M5136 Other intervertebral disc degeneration, lumbar region: Secondary | ICD-10-CM | POA: Diagnosis not present

## 2021-04-25 DIAGNOSIS — M5441 Lumbago with sciatica, right side: Secondary | ICD-10-CM | POA: Diagnosis not present

## 2021-04-25 DIAGNOSIS — G43019 Migraine without aura, intractable, without status migrainosus: Secondary | ICD-10-CM | POA: Diagnosis not present

## 2021-04-25 DIAGNOSIS — M47816 Spondylosis without myelopathy or radiculopathy, lumbar region: Secondary | ICD-10-CM | POA: Diagnosis not present

## 2021-04-25 DIAGNOSIS — I10 Essential (primary) hypertension: Secondary | ICD-10-CM | POA: Diagnosis not present

## 2021-04-25 DIAGNOSIS — M5416 Radiculopathy, lumbar region: Secondary | ICD-10-CM | POA: Diagnosis not present

## 2021-04-25 MED ORDER — UBRELVY 100 MG PO TABS
100.0000 mg | ORAL_TABLET | ORAL | 5 refills | Status: DC | PRN
  Filled 2021-04-25: qty 16, 30d supply, fill #0
  Filled 2021-08-28: qty 16, 30d supply, fill #1
  Filled 2021-10-19: qty 16, 30d supply, fill #2
  Filled 2022-02-22: qty 16, 30d supply, fill #3
  Filled 2022-04-25: qty 16, 30d supply, fill #4

## 2021-04-26 ENCOUNTER — Other Ambulatory Visit (HOSPITAL_BASED_OUTPATIENT_CLINIC_OR_DEPARTMENT_OTHER): Payer: Self-pay

## 2021-04-27 ENCOUNTER — Other Ambulatory Visit (HOSPITAL_BASED_OUTPATIENT_CLINIC_OR_DEPARTMENT_OTHER): Payer: Self-pay

## 2021-04-28 ENCOUNTER — Other Ambulatory Visit (HOSPITAL_BASED_OUTPATIENT_CLINIC_OR_DEPARTMENT_OTHER): Payer: Self-pay

## 2021-04-28 MED ORDER — GEMTESA 75 MG PO TABS
75.0000 mg | ORAL_TABLET | Freq: Every day | ORAL | 4 refills | Status: DC
  Filled 2021-04-28: qty 90, 90d supply, fill #0
  Filled 2021-08-01: qty 90, 90d supply, fill #1
  Filled 2021-10-19: qty 90, 90d supply, fill #2

## 2021-04-29 ENCOUNTER — Other Ambulatory Visit (HOSPITAL_BASED_OUTPATIENT_CLINIC_OR_DEPARTMENT_OTHER): Payer: Self-pay

## 2021-04-29 MED ORDER — NURTEC 75 MG PO TBDP
75.0000 mg | ORAL_TABLET | ORAL | 5 refills | Status: DC | PRN
  Filled 2021-04-29: qty 8, 16d supply, fill #0
  Filled 2021-09-07: qty 8, 16d supply, fill #1
  Filled 2021-10-19: qty 8, 16d supply, fill #2
  Filled 2021-11-16 – 2021-12-14 (×2): qty 8, 16d supply, fill #3

## 2021-04-29 MED ORDER — PREDNISONE 10 MG PO TABS
ORAL_TABLET | ORAL | 0 refills | Status: DC
  Filled 2021-04-29: qty 23, 7d supply, fill #0

## 2021-05-01 ENCOUNTER — Encounter: Payer: Self-pay | Admitting: Interventional Cardiology

## 2021-05-02 ENCOUNTER — Other Ambulatory Visit (HOSPITAL_BASED_OUTPATIENT_CLINIC_OR_DEPARTMENT_OTHER): Payer: Self-pay

## 2021-05-02 DIAGNOSIS — M5416 Radiculopathy, lumbar region: Secondary | ICD-10-CM | POA: Diagnosis not present

## 2021-05-03 ENCOUNTER — Other Ambulatory Visit (HOSPITAL_BASED_OUTPATIENT_CLINIC_OR_DEPARTMENT_OTHER): Payer: Self-pay

## 2021-05-03 DIAGNOSIS — I10 Essential (primary) hypertension: Secondary | ICD-10-CM | POA: Diagnosis not present

## 2021-05-03 DIAGNOSIS — E782 Mixed hyperlipidemia: Secondary | ICD-10-CM | POA: Diagnosis not present

## 2021-05-03 DIAGNOSIS — M545 Low back pain, unspecified: Secondary | ICD-10-CM | POA: Diagnosis not present

## 2021-05-03 DIAGNOSIS — R7303 Prediabetes: Secondary | ICD-10-CM | POA: Diagnosis not present

## 2021-05-03 MED ORDER — OXYCODONE-ACETAMINOPHEN 5-325 MG PO TABS
ORAL_TABLET | ORAL | 0 refills | Status: DC
  Filled 2021-05-03: qty 12, 3d supply, fill #0

## 2021-05-03 MED ORDER — CYCLOBENZAPRINE HCL 10 MG PO TABS
ORAL_TABLET | ORAL | 0 refills | Status: DC
  Filled 2021-05-03: qty 30, 10d supply, fill #0

## 2021-05-03 MED ORDER — OMEGA-3-ACID ETHYL ESTERS 1 G PO CAPS
ORAL_CAPSULE | ORAL | 2 refills | Status: DC
  Filled 2021-05-03 (×2): qty 360, 90d supply, fill #0
  Filled 2021-07-28: qty 360, 90d supply, fill #1
  Filled 2021-10-26: qty 360, 90d supply, fill #2

## 2021-05-04 ENCOUNTER — Other Ambulatory Visit (HOSPITAL_BASED_OUTPATIENT_CLINIC_OR_DEPARTMENT_OTHER): Payer: Self-pay

## 2021-05-05 ENCOUNTER — Other Ambulatory Visit (HOSPITAL_BASED_OUTPATIENT_CLINIC_OR_DEPARTMENT_OTHER): Payer: Self-pay

## 2021-05-06 ENCOUNTER — Other Ambulatory Visit (HOSPITAL_BASED_OUTPATIENT_CLINIC_OR_DEPARTMENT_OTHER): Payer: Self-pay

## 2021-05-09 DIAGNOSIS — M2560 Stiffness of unspecified joint, not elsewhere classified: Secondary | ICD-10-CM | POA: Diagnosis not present

## 2021-05-09 DIAGNOSIS — H33322 Round hole, left eye: Secondary | ICD-10-CM | POA: Diagnosis not present

## 2021-05-09 DIAGNOSIS — H2513 Age-related nuclear cataract, bilateral: Secondary | ICD-10-CM | POA: Diagnosis not present

## 2021-05-09 DIAGNOSIS — H35341 Macular cyst, hole, or pseudohole, right eye: Secondary | ICD-10-CM | POA: Diagnosis not present

## 2021-05-09 DIAGNOSIS — M545 Low back pain, unspecified: Secondary | ICD-10-CM | POA: Diagnosis not present

## 2021-05-09 DIAGNOSIS — M6281 Muscle weakness (generalized): Secondary | ICD-10-CM | POA: Diagnosis not present

## 2021-05-09 DIAGNOSIS — H35413 Lattice degeneration of retina, bilateral: Secondary | ICD-10-CM | POA: Diagnosis not present

## 2021-05-09 DIAGNOSIS — H35033 Hypertensive retinopathy, bilateral: Secondary | ICD-10-CM | POA: Diagnosis not present

## 2021-05-12 ENCOUNTER — Other Ambulatory Visit (HOSPITAL_BASED_OUTPATIENT_CLINIC_OR_DEPARTMENT_OTHER): Payer: Self-pay

## 2021-05-12 MED ORDER — PREGABALIN 50 MG PO CAPS
ORAL_CAPSULE | ORAL | 2 refills | Status: DC
  Filled 2021-05-12: qty 90, 30d supply, fill #0
  Filled 2021-06-14: qty 90, 30d supply, fill #1
  Filled 2021-07-20: qty 90, 30d supply, fill #2

## 2021-05-13 ENCOUNTER — Other Ambulatory Visit (HOSPITAL_BASED_OUTPATIENT_CLINIC_OR_DEPARTMENT_OTHER): Payer: Self-pay

## 2021-05-16 ENCOUNTER — Other Ambulatory Visit (HOSPITAL_BASED_OUTPATIENT_CLINIC_OR_DEPARTMENT_OTHER): Payer: Self-pay

## 2021-05-16 DIAGNOSIS — E782 Mixed hyperlipidemia: Secondary | ICD-10-CM | POA: Diagnosis not present

## 2021-05-16 DIAGNOSIS — I10 Essential (primary) hypertension: Secondary | ICD-10-CM | POA: Diagnosis not present

## 2021-05-16 DIAGNOSIS — M2560 Stiffness of unspecified joint, not elsewhere classified: Secondary | ICD-10-CM | POA: Diagnosis not present

## 2021-05-16 DIAGNOSIS — M545 Low back pain, unspecified: Secondary | ICD-10-CM | POA: Diagnosis not present

## 2021-05-16 DIAGNOSIS — R7303 Prediabetes: Secondary | ICD-10-CM | POA: Diagnosis not present

## 2021-05-16 DIAGNOSIS — M6281 Muscle weakness (generalized): Secondary | ICD-10-CM | POA: Diagnosis not present

## 2021-05-16 MED ORDER — OMEGA-3-ACID ETHYL ESTERS 1 G PO CAPS
ORAL_CAPSULE | ORAL | 2 refills | Status: DC
  Filled 2021-05-16: qty 360, 90d supply, fill #0

## 2021-05-16 MED ORDER — CYCLOBENZAPRINE HCL 5 MG PO TABS
5.0000 mg | ORAL_TABLET | Freq: Three times a day (TID) | ORAL | 0 refills | Status: DC | PRN
  Filled 2021-05-16: qty 90, 30d supply, fill #0

## 2021-05-16 MED ORDER — DICLOFENAC SODIUM 75 MG PO TBEC
75.0000 mg | DELAYED_RELEASE_TABLET | Freq: Two times a day (BID) | ORAL | 0 refills | Status: DC | PRN
  Filled 2021-05-16: qty 30, 15d supply, fill #0

## 2021-05-16 MED ORDER — LOSARTAN POTASSIUM 50 MG PO TABS: ORAL_TABLET | ORAL | 0 refills | Status: DC

## 2021-05-16 MED ORDER — LOSARTAN POTASSIUM 50 MG PO TABS
ORAL_TABLET | ORAL | 5 refills | Status: DC
  Filled 2021-05-16: qty 180, 90d supply, fill #0
  Filled 2021-08-01: qty 180, 90d supply, fill #1

## 2021-05-18 ENCOUNTER — Other Ambulatory Visit (HOSPITAL_BASED_OUTPATIENT_CLINIC_OR_DEPARTMENT_OTHER): Payer: Self-pay

## 2021-05-18 MED ORDER — CYCLOBENZAPRINE HCL 10 MG PO TABS
ORAL_TABLET | ORAL | 0 refills | Status: DC
  Filled 2021-05-18: qty 30, 10d supply, fill #0

## 2021-05-19 ENCOUNTER — Other Ambulatory Visit (HOSPITAL_BASED_OUTPATIENT_CLINIC_OR_DEPARTMENT_OTHER): Payer: Self-pay

## 2021-05-23 DIAGNOSIS — M6281 Muscle weakness (generalized): Secondary | ICD-10-CM | POA: Diagnosis not present

## 2021-05-23 DIAGNOSIS — M545 Low back pain, unspecified: Secondary | ICD-10-CM | POA: Diagnosis not present

## 2021-05-23 DIAGNOSIS — M2560 Stiffness of unspecified joint, not elsewhere classified: Secondary | ICD-10-CM | POA: Diagnosis not present

## 2021-05-24 ENCOUNTER — Other Ambulatory Visit (HOSPITAL_BASED_OUTPATIENT_CLINIC_OR_DEPARTMENT_OTHER): Payer: Self-pay

## 2021-05-24 DIAGNOSIS — M5126 Other intervertebral disc displacement, lumbar region: Secondary | ICD-10-CM | POA: Diagnosis not present

## 2021-05-24 DIAGNOSIS — M5416 Radiculopathy, lumbar region: Secondary | ICD-10-CM | POA: Diagnosis not present

## 2021-05-24 MED ORDER — DIAZEPAM 5 MG PO TABS
ORAL_TABLET | ORAL | 0 refills | Status: DC
  Filled 2021-05-24: qty 2, 1d supply, fill #0

## 2021-05-25 DIAGNOSIS — M5416 Radiculopathy, lumbar region: Secondary | ICD-10-CM | POA: Diagnosis not present

## 2021-05-30 DIAGNOSIS — M6281 Muscle weakness (generalized): Secondary | ICD-10-CM | POA: Diagnosis not present

## 2021-05-30 DIAGNOSIS — M545 Low back pain, unspecified: Secondary | ICD-10-CM | POA: Diagnosis not present

## 2021-05-30 DIAGNOSIS — M2560 Stiffness of unspecified joint, not elsewhere classified: Secondary | ICD-10-CM | POA: Diagnosis not present

## 2021-06-03 ENCOUNTER — Other Ambulatory Visit (HOSPITAL_BASED_OUTPATIENT_CLINIC_OR_DEPARTMENT_OTHER): Payer: Self-pay

## 2021-06-03 DIAGNOSIS — M545 Low back pain, unspecified: Secondary | ICD-10-CM | POA: Diagnosis not present

## 2021-06-03 DIAGNOSIS — M6281 Muscle weakness (generalized): Secondary | ICD-10-CM | POA: Diagnosis not present

## 2021-06-03 DIAGNOSIS — M2560 Stiffness of unspecified joint, not elsewhere classified: Secondary | ICD-10-CM | POA: Diagnosis not present

## 2021-06-03 MED ORDER — METHOCARBAMOL 750 MG PO TABS
ORAL_TABLET | ORAL | 2 refills | Status: DC
  Filled 2021-06-03: qty 30, 10d supply, fill #0
  Filled 2021-08-01 (×2): qty 30, 10d supply, fill #1
  Filled 2021-09-26: qty 30, 10d supply, fill #2

## 2021-06-06 ENCOUNTER — Other Ambulatory Visit (HOSPITAL_BASED_OUTPATIENT_CLINIC_OR_DEPARTMENT_OTHER): Payer: Self-pay

## 2021-06-06 DIAGNOSIS — M2560 Stiffness of unspecified joint, not elsewhere classified: Secondary | ICD-10-CM | POA: Diagnosis not present

## 2021-06-06 DIAGNOSIS — M6281 Muscle weakness (generalized): Secondary | ICD-10-CM | POA: Diagnosis not present

## 2021-06-06 DIAGNOSIS — M545 Low back pain, unspecified: Secondary | ICD-10-CM | POA: Diagnosis not present

## 2021-06-06 MED ORDER — VALACYCLOVIR HCL 1 G PO TABS
ORAL_TABLET | ORAL | 0 refills | Status: DC
  Filled 2021-06-06: qty 20, 10d supply, fill #0

## 2021-06-09 ENCOUNTER — Other Ambulatory Visit (HOSPITAL_BASED_OUTPATIENT_CLINIC_OR_DEPARTMENT_OTHER): Payer: Self-pay

## 2021-06-09 DIAGNOSIS — I10 Essential (primary) hypertension: Secondary | ICD-10-CM | POA: Diagnosis not present

## 2021-06-09 DIAGNOSIS — M25551 Pain in right hip: Secondary | ICD-10-CM | POA: Diagnosis not present

## 2021-06-09 DIAGNOSIS — M545 Low back pain, unspecified: Secondary | ICD-10-CM | POA: Diagnosis not present

## 2021-06-09 MED ORDER — METHOCARBAMOL 750 MG PO TABS
750.0000 mg | ORAL_TABLET | Freq: Three times a day (TID) | ORAL | 1 refills | Status: DC | PRN
  Filled 2021-06-09 – 2021-06-13 (×2): qty 90, 30d supply, fill #0
  Filled 2021-07-20: qty 90, 30d supply, fill #1

## 2021-06-10 DIAGNOSIS — M545 Low back pain, unspecified: Secondary | ICD-10-CM | POA: Diagnosis not present

## 2021-06-10 DIAGNOSIS — M6281 Muscle weakness (generalized): Secondary | ICD-10-CM | POA: Diagnosis not present

## 2021-06-10 DIAGNOSIS — M2560 Stiffness of unspecified joint, not elsewhere classified: Secondary | ICD-10-CM | POA: Diagnosis not present

## 2021-06-13 ENCOUNTER — Other Ambulatory Visit (HOSPITAL_BASED_OUTPATIENT_CLINIC_OR_DEPARTMENT_OTHER): Payer: Self-pay

## 2021-06-14 ENCOUNTER — Other Ambulatory Visit (HOSPITAL_BASED_OUTPATIENT_CLINIC_OR_DEPARTMENT_OTHER): Payer: Self-pay

## 2021-06-14 DIAGNOSIS — M5416 Radiculopathy, lumbar region: Secondary | ICD-10-CM | POA: Diagnosis not present

## 2021-06-14 DIAGNOSIS — M5126 Other intervertebral disc displacement, lumbar region: Secondary | ICD-10-CM | POA: Diagnosis not present

## 2021-06-14 DIAGNOSIS — M25551 Pain in right hip: Secondary | ICD-10-CM | POA: Diagnosis not present

## 2021-06-15 DIAGNOSIS — M5416 Radiculopathy, lumbar region: Secondary | ICD-10-CM | POA: Diagnosis not present

## 2021-06-17 DIAGNOSIS — N138 Other obstructive and reflux uropathy: Secondary | ICD-10-CM | POA: Diagnosis not present

## 2021-06-17 DIAGNOSIS — N401 Enlarged prostate with lower urinary tract symptoms: Secondary | ICD-10-CM | POA: Diagnosis not present

## 2021-06-17 DIAGNOSIS — R3589 Other polyuria: Secondary | ICD-10-CM | POA: Diagnosis not present

## 2021-06-20 DIAGNOSIS — M545 Low back pain, unspecified: Secondary | ICD-10-CM | POA: Diagnosis not present

## 2021-06-20 DIAGNOSIS — M2560 Stiffness of unspecified joint, not elsewhere classified: Secondary | ICD-10-CM | POA: Diagnosis not present

## 2021-06-20 DIAGNOSIS — M6281 Muscle weakness (generalized): Secondary | ICD-10-CM | POA: Diagnosis not present

## 2021-06-22 DIAGNOSIS — M545 Low back pain, unspecified: Secondary | ICD-10-CM | POA: Diagnosis not present

## 2021-06-22 DIAGNOSIS — M6281 Muscle weakness (generalized): Secondary | ICD-10-CM | POA: Diagnosis not present

## 2021-06-22 DIAGNOSIS — M2560 Stiffness of unspecified joint, not elsewhere classified: Secondary | ICD-10-CM | POA: Diagnosis not present

## 2021-06-27 DIAGNOSIS — M6281 Muscle weakness (generalized): Secondary | ICD-10-CM | POA: Diagnosis not present

## 2021-06-27 DIAGNOSIS — M545 Low back pain, unspecified: Secondary | ICD-10-CM | POA: Diagnosis not present

## 2021-06-27 DIAGNOSIS — M2560 Stiffness of unspecified joint, not elsewhere classified: Secondary | ICD-10-CM | POA: Diagnosis not present

## 2021-07-01 DIAGNOSIS — M2560 Stiffness of unspecified joint, not elsewhere classified: Secondary | ICD-10-CM | POA: Diagnosis not present

## 2021-07-01 DIAGNOSIS — M6281 Muscle weakness (generalized): Secondary | ICD-10-CM | POA: Diagnosis not present

## 2021-07-01 DIAGNOSIS — M545 Low back pain, unspecified: Secondary | ICD-10-CM | POA: Diagnosis not present

## 2021-07-05 DIAGNOSIS — M2560 Stiffness of unspecified joint, not elsewhere classified: Secondary | ICD-10-CM | POA: Diagnosis not present

## 2021-07-05 DIAGNOSIS — M545 Low back pain, unspecified: Secondary | ICD-10-CM | POA: Diagnosis not present

## 2021-07-05 DIAGNOSIS — M6281 Muscle weakness (generalized): Secondary | ICD-10-CM | POA: Diagnosis not present

## 2021-07-06 ENCOUNTER — Other Ambulatory Visit (HOSPITAL_BASED_OUTPATIENT_CLINIC_OR_DEPARTMENT_OTHER): Payer: Self-pay

## 2021-07-06 DIAGNOSIS — M5126 Other intervertebral disc displacement, lumbar region: Secondary | ICD-10-CM | POA: Diagnosis not present

## 2021-07-06 DIAGNOSIS — M5416 Radiculopathy, lumbar region: Secondary | ICD-10-CM | POA: Diagnosis not present

## 2021-07-08 ENCOUNTER — Other Ambulatory Visit (HOSPITAL_BASED_OUTPATIENT_CLINIC_OR_DEPARTMENT_OTHER): Payer: Self-pay

## 2021-07-08 DIAGNOSIS — M545 Low back pain, unspecified: Secondary | ICD-10-CM | POA: Diagnosis not present

## 2021-07-08 DIAGNOSIS — M6281 Muscle weakness (generalized): Secondary | ICD-10-CM | POA: Diagnosis not present

## 2021-07-08 DIAGNOSIS — M2560 Stiffness of unspecified joint, not elsewhere classified: Secondary | ICD-10-CM | POA: Diagnosis not present

## 2021-07-11 ENCOUNTER — Other Ambulatory Visit (HOSPITAL_BASED_OUTPATIENT_CLINIC_OR_DEPARTMENT_OTHER): Payer: Self-pay

## 2021-07-11 DIAGNOSIS — U071 COVID-19: Secondary | ICD-10-CM | POA: Diagnosis not present

## 2021-07-11 DIAGNOSIS — J209 Acute bronchitis, unspecified: Secondary | ICD-10-CM | POA: Diagnosis not present

## 2021-07-11 MED ORDER — SM TUSSIN DM MAX 20-400 MG/20ML PO LIQD: ORAL | 0 refills | Status: DC

## 2021-07-11 MED ORDER — PAXLOVID (300/100) 20 X 150 MG & 10 X 100MG PO TBPK
ORAL_TABLET | ORAL | 0 refills | Status: DC
  Filled 2021-07-11: qty 30, 5d supply, fill #0

## 2021-07-11 MED ORDER — PREDNISONE 20 MG PO TABS
ORAL_TABLET | ORAL | 0 refills | Status: DC
  Filled 2021-07-11: qty 5, 5d supply, fill #0

## 2021-07-11 MED ORDER — AZITHROMYCIN 250 MG PO TABS
ORAL_TABLET | ORAL | 0 refills | Status: DC
  Filled 2021-07-11: qty 6, 5d supply, fill #0

## 2021-07-14 ENCOUNTER — Other Ambulatory Visit (HOSPITAL_BASED_OUTPATIENT_CLINIC_OR_DEPARTMENT_OTHER): Payer: Self-pay

## 2021-07-18 ENCOUNTER — Other Ambulatory Visit (HOSPITAL_BASED_OUTPATIENT_CLINIC_OR_DEPARTMENT_OTHER): Payer: Self-pay

## 2021-07-19 DIAGNOSIS — M545 Low back pain, unspecified: Secondary | ICD-10-CM | POA: Diagnosis not present

## 2021-07-19 DIAGNOSIS — M6281 Muscle weakness (generalized): Secondary | ICD-10-CM | POA: Diagnosis not present

## 2021-07-19 DIAGNOSIS — M2560 Stiffness of unspecified joint, not elsewhere classified: Secondary | ICD-10-CM | POA: Diagnosis not present

## 2021-07-20 ENCOUNTER — Other Ambulatory Visit (HOSPITAL_BASED_OUTPATIENT_CLINIC_OR_DEPARTMENT_OTHER): Payer: Self-pay

## 2021-07-25 DIAGNOSIS — M545 Low back pain, unspecified: Secondary | ICD-10-CM | POA: Diagnosis not present

## 2021-07-25 DIAGNOSIS — M6281 Muscle weakness (generalized): Secondary | ICD-10-CM | POA: Diagnosis not present

## 2021-07-25 DIAGNOSIS — M2560 Stiffness of unspecified joint, not elsewhere classified: Secondary | ICD-10-CM | POA: Diagnosis not present

## 2021-07-27 DIAGNOSIS — M6281 Muscle weakness (generalized): Secondary | ICD-10-CM | POA: Diagnosis not present

## 2021-07-27 DIAGNOSIS — M545 Low back pain, unspecified: Secondary | ICD-10-CM | POA: Diagnosis not present

## 2021-07-27 DIAGNOSIS — M2560 Stiffness of unspecified joint, not elsewhere classified: Secondary | ICD-10-CM | POA: Diagnosis not present

## 2021-07-28 ENCOUNTER — Other Ambulatory Visit (HOSPITAL_BASED_OUTPATIENT_CLINIC_OR_DEPARTMENT_OTHER): Payer: Self-pay

## 2021-07-29 ENCOUNTER — Other Ambulatory Visit (HOSPITAL_BASED_OUTPATIENT_CLINIC_OR_DEPARTMENT_OTHER): Payer: Self-pay

## 2021-07-29 DIAGNOSIS — M2560 Stiffness of unspecified joint, not elsewhere classified: Secondary | ICD-10-CM | POA: Diagnosis not present

## 2021-07-29 DIAGNOSIS — M6281 Muscle weakness (generalized): Secondary | ICD-10-CM | POA: Diagnosis not present

## 2021-07-29 DIAGNOSIS — M545 Low back pain, unspecified: Secondary | ICD-10-CM | POA: Diagnosis not present

## 2021-08-01 ENCOUNTER — Other Ambulatory Visit (HOSPITAL_BASED_OUTPATIENT_CLINIC_OR_DEPARTMENT_OTHER): Payer: Self-pay

## 2021-08-01 DIAGNOSIS — M6281 Muscle weakness (generalized): Secondary | ICD-10-CM | POA: Diagnosis not present

## 2021-08-01 DIAGNOSIS — M2560 Stiffness of unspecified joint, not elsewhere classified: Secondary | ICD-10-CM | POA: Diagnosis not present

## 2021-08-01 DIAGNOSIS — M545 Low back pain, unspecified: Secondary | ICD-10-CM | POA: Diagnosis not present

## 2021-08-02 ENCOUNTER — Other Ambulatory Visit (HOSPITAL_BASED_OUTPATIENT_CLINIC_OR_DEPARTMENT_OTHER): Payer: Self-pay

## 2021-08-02 DIAGNOSIS — J029 Acute pharyngitis, unspecified: Secondary | ICD-10-CM | POA: Diagnosis not present

## 2021-08-02 MED ORDER — AZITHROMYCIN 250 MG PO TABS
ORAL_TABLET | ORAL | 0 refills | Status: DC
  Filled 2021-08-02: qty 6, 5d supply, fill #0

## 2021-08-03 ENCOUNTER — Other Ambulatory Visit (HOSPITAL_BASED_OUTPATIENT_CLINIC_OR_DEPARTMENT_OTHER): Payer: Self-pay

## 2021-08-03 DIAGNOSIS — M6281 Muscle weakness (generalized): Secondary | ICD-10-CM | POA: Diagnosis not present

## 2021-08-03 DIAGNOSIS — M2560 Stiffness of unspecified joint, not elsewhere classified: Secondary | ICD-10-CM | POA: Diagnosis not present

## 2021-08-03 DIAGNOSIS — M545 Low back pain, unspecified: Secondary | ICD-10-CM | POA: Diagnosis not present

## 2021-08-11 ENCOUNTER — Other Ambulatory Visit (HOSPITAL_BASED_OUTPATIENT_CLINIC_OR_DEPARTMENT_OTHER): Payer: Self-pay

## 2021-08-15 ENCOUNTER — Other Ambulatory Visit (HOSPITAL_BASED_OUTPATIENT_CLINIC_OR_DEPARTMENT_OTHER): Payer: Self-pay

## 2021-08-22 ENCOUNTER — Other Ambulatory Visit (HOSPITAL_BASED_OUTPATIENT_CLINIC_OR_DEPARTMENT_OTHER): Payer: Self-pay

## 2021-08-23 DIAGNOSIS — M5416 Radiculopathy, lumbar region: Secondary | ICD-10-CM | POA: Diagnosis not present

## 2021-08-24 ENCOUNTER — Other Ambulatory Visit: Payer: Self-pay | Admitting: Physical Medicine and Rehabilitation

## 2021-08-24 DIAGNOSIS — M6281 Muscle weakness (generalized): Secondary | ICD-10-CM | POA: Diagnosis not present

## 2021-08-24 DIAGNOSIS — M545 Low back pain, unspecified: Secondary | ICD-10-CM | POA: Diagnosis not present

## 2021-08-24 DIAGNOSIS — M79604 Pain in right leg: Secondary | ICD-10-CM

## 2021-08-24 DIAGNOSIS — M2560 Stiffness of unspecified joint, not elsewhere classified: Secondary | ICD-10-CM | POA: Diagnosis not present

## 2021-08-25 ENCOUNTER — Other Ambulatory Visit (HOSPITAL_BASED_OUTPATIENT_CLINIC_OR_DEPARTMENT_OTHER): Payer: Self-pay

## 2021-08-25 MED ORDER — PREGABALIN 50 MG PO CAPS
ORAL_CAPSULE | ORAL | 2 refills | Status: DC
  Filled 2021-08-25: qty 90, 30d supply, fill #0
  Filled 2021-09-23: qty 90, 30d supply, fill #1
  Filled 2021-10-29: qty 90, 30d supply, fill #2

## 2021-08-26 DIAGNOSIS — M2560 Stiffness of unspecified joint, not elsewhere classified: Secondary | ICD-10-CM | POA: Diagnosis not present

## 2021-08-26 DIAGNOSIS — M545 Low back pain, unspecified: Secondary | ICD-10-CM | POA: Diagnosis not present

## 2021-08-26 DIAGNOSIS — M6281 Muscle weakness (generalized): Secondary | ICD-10-CM | POA: Diagnosis not present

## 2021-08-29 ENCOUNTER — Ambulatory Visit
Admission: RE | Admit: 2021-08-29 | Discharge: 2021-08-29 | Disposition: A | Discharge status: Home / Self Care | Payer: Federal, State, Local not specified - PPO | Source: Ambulatory Visit | Attending: Physical Medicine and Rehabilitation | Admitting: Physical Medicine and Rehabilitation

## 2021-08-29 ENCOUNTER — Other Ambulatory Visit (HOSPITAL_BASED_OUTPATIENT_CLINIC_OR_DEPARTMENT_OTHER): Payer: Self-pay

## 2021-08-29 DIAGNOSIS — M4316 Spondylolisthesis, lumbar region: Secondary | ICD-10-CM | POA: Diagnosis not present

## 2021-08-29 DIAGNOSIS — I10 Essential (primary) hypertension: Secondary | ICD-10-CM | POA: Diagnosis not present

## 2021-08-29 DIAGNOSIS — M5126 Other intervertebral disc displacement, lumbar region: Secondary | ICD-10-CM | POA: Diagnosis not present

## 2021-08-29 DIAGNOSIS — M6281 Muscle weakness (generalized): Secondary | ICD-10-CM | POA: Diagnosis not present

## 2021-08-29 DIAGNOSIS — M549 Dorsalgia, unspecified: Secondary | ICD-10-CM | POA: Diagnosis not present

## 2021-08-29 DIAGNOSIS — M79604 Pain in right leg: Secondary | ICD-10-CM

## 2021-08-29 DIAGNOSIS — E782 Mixed hyperlipidemia: Secondary | ICD-10-CM | POA: Diagnosis not present

## 2021-08-29 DIAGNOSIS — M545 Low back pain, unspecified: Secondary | ICD-10-CM | POA: Diagnosis not present

## 2021-08-29 DIAGNOSIS — M2560 Stiffness of unspecified joint, not elsewhere classified: Secondary | ICD-10-CM | POA: Diagnosis not present

## 2021-08-29 MED ORDER — ATORVASTATIN CALCIUM 10 MG PO TABS
10.0000 mg | ORAL_TABLET | Freq: Every day | ORAL | 1 refills | Status: DC
  Filled 2021-08-29: qty 90, 90d supply, fill #0
  Filled 2021-11-30: qty 90, 90d supply, fill #1

## 2021-08-29 MED ORDER — MEPERIDINE HCL 50 MG/ML IJ SOLN: 50.0000 mg | Freq: Once | INTRAMUSCULAR | Status: DC | PRN

## 2021-08-29 MED ORDER — DIAZEPAM 5 MG PO TABS
5.0000 mg | ORAL_TABLET | Freq: Once | ORAL | Status: AC
Start: 2021-08-29 — End: 2021-08-29
  Administered 2021-08-29: 5 mg via ORAL

## 2021-08-29 MED ORDER — ONDANSETRON HCL 4 MG/2ML IJ SOLN: 4.0000 mg | Freq: Once | INTRAMUSCULAR | Status: DC | PRN

## 2021-08-29 MED ORDER — IOPAMIDOL (ISOVUE-M 200) INJECTION 41%
20.0000 mL | Freq: Once | INTRAMUSCULAR | Status: AC
  Administered 2021-08-29: 20 mL via INTRATHECAL

## 2021-08-29 MED ORDER — LOSARTAN POTASSIUM 50 MG PO TABS
100.0000 mg | ORAL_TABLET | Freq: Every day | ORAL | 1 refills | Status: DC
  Filled 2021-08-29 – 2022-03-06 (×3): qty 180, 90d supply, fill #0

## 2021-08-29 NOTE — Discharge Instructions (Signed)

## 2021-08-30 ENCOUNTER — Other Ambulatory Visit (HOSPITAL_BASED_OUTPATIENT_CLINIC_OR_DEPARTMENT_OTHER): Payer: Self-pay

## 2021-08-30 MED ORDER — DICLOFENAC SODIUM 75 MG PO TBEC
DELAYED_RELEASE_TABLET | ORAL | 2 refills | Status: DC
Start: 2021-08-29 — End: 2022-05-08
  Filled 2021-08-30: qty 60, 30d supply, fill #0

## 2021-09-02 DIAGNOSIS — M47816 Spondylosis without myelopathy or radiculopathy, lumbar region: Secondary | ICD-10-CM | POA: Diagnosis not present

## 2021-09-02 DIAGNOSIS — M5416 Radiculopathy, lumbar region: Secondary | ICD-10-CM | POA: Diagnosis not present

## 2021-09-02 DIAGNOSIS — M48061 Spinal stenosis, lumbar region without neurogenic claudication: Secondary | ICD-10-CM | POA: Diagnosis not present

## 2021-09-02 DIAGNOSIS — M5126 Other intervertebral disc displacement, lumbar region: Secondary | ICD-10-CM | POA: Diagnosis not present

## 2021-09-05 ENCOUNTER — Other Ambulatory Visit (HOSPITAL_BASED_OUTPATIENT_CLINIC_OR_DEPARTMENT_OTHER): Payer: Self-pay

## 2021-09-07 DIAGNOSIS — M5416 Radiculopathy, lumbar region: Secondary | ICD-10-CM | POA: Diagnosis not present

## 2021-09-08 ENCOUNTER — Other Ambulatory Visit (HOSPITAL_BASED_OUTPATIENT_CLINIC_OR_DEPARTMENT_OTHER): Payer: Self-pay

## 2021-09-12 ENCOUNTER — Other Ambulatory Visit (HOSPITAL_BASED_OUTPATIENT_CLINIC_OR_DEPARTMENT_OTHER): Payer: Self-pay

## 2021-09-12 MED ORDER — MELOXICAM 15 MG PO TABS
ORAL_TABLET | ORAL | 0 refills | Status: DC
  Filled 2021-09-12: qty 30, 30d supply, fill #0

## 2021-09-13 ENCOUNTER — Other Ambulatory Visit (HOSPITAL_BASED_OUTPATIENT_CLINIC_OR_DEPARTMENT_OTHER): Payer: Self-pay

## 2021-09-13 DIAGNOSIS — M47816 Spondylosis without myelopathy or radiculopathy, lumbar region: Secondary | ICD-10-CM | POA: Diagnosis not present

## 2021-09-13 DIAGNOSIS — M5126 Other intervertebral disc displacement, lumbar region: Secondary | ICD-10-CM | POA: Diagnosis not present

## 2021-09-13 DIAGNOSIS — M5136 Other intervertebral disc degeneration, lumbar region: Secondary | ICD-10-CM | POA: Diagnosis not present

## 2021-09-13 DIAGNOSIS — M5416 Radiculopathy, lumbar region: Secondary | ICD-10-CM | POA: Diagnosis not present

## 2021-09-13 MED ORDER — TIZANIDINE HCL 4 MG PO TABS
4.0000 mg | ORAL_TABLET | Freq: Four times a day (QID) | ORAL | 0 refills | Status: DC | PRN
Start: 2021-09-13 — End: 2021-09-23
  Filled 2021-09-13: qty 60, 15d supply, fill #0

## 2021-09-15 DIAGNOSIS — M6281 Muscle weakness (generalized): Secondary | ICD-10-CM | POA: Diagnosis not present

## 2021-09-15 DIAGNOSIS — M2560 Stiffness of unspecified joint, not elsewhere classified: Secondary | ICD-10-CM | POA: Diagnosis not present

## 2021-09-15 DIAGNOSIS — M545 Low back pain, unspecified: Secondary | ICD-10-CM | POA: Diagnosis not present

## 2021-09-16 ENCOUNTER — Other Ambulatory Visit (HOSPITAL_BASED_OUTPATIENT_CLINIC_OR_DEPARTMENT_OTHER): Payer: Self-pay

## 2021-09-16 DIAGNOSIS — M6281 Muscle weakness (generalized): Secondary | ICD-10-CM | POA: Diagnosis not present

## 2021-09-16 DIAGNOSIS — M2560 Stiffness of unspecified joint, not elsewhere classified: Secondary | ICD-10-CM | POA: Diagnosis not present

## 2021-09-16 DIAGNOSIS — M545 Low back pain, unspecified: Secondary | ICD-10-CM | POA: Diagnosis not present

## 2021-09-20 DIAGNOSIS — M545 Low back pain, unspecified: Secondary | ICD-10-CM | POA: Diagnosis not present

## 2021-09-20 DIAGNOSIS — M6281 Muscle weakness (generalized): Secondary | ICD-10-CM | POA: Diagnosis not present

## 2021-09-20 DIAGNOSIS — M2560 Stiffness of unspecified joint, not elsewhere classified: Secondary | ICD-10-CM | POA: Diagnosis not present

## 2021-09-22 DIAGNOSIS — M545 Low back pain, unspecified: Secondary | ICD-10-CM | POA: Diagnosis not present

## 2021-09-22 DIAGNOSIS — M2560 Stiffness of unspecified joint, not elsewhere classified: Secondary | ICD-10-CM | POA: Diagnosis not present

## 2021-09-22 DIAGNOSIS — M6281 Muscle weakness (generalized): Secondary | ICD-10-CM | POA: Diagnosis not present

## 2021-09-23 ENCOUNTER — Other Ambulatory Visit (HOSPITAL_BASED_OUTPATIENT_CLINIC_OR_DEPARTMENT_OTHER): Payer: Self-pay

## 2021-09-23 MED ORDER — TIZANIDINE HCL 4 MG PO TABS
ORAL_TABLET | ORAL | 2 refills | Status: DC
Start: 2021-09-23 — End: 2022-01-11
  Filled 2021-09-23: qty 90, 22d supply, fill #0
  Filled 2021-09-26: qty 90, 23d supply, fill #0
  Filled 2021-10-19: qty 90, 23d supply, fill #1
  Filled 2021-11-30: qty 90, 23d supply, fill #2

## 2021-09-26 ENCOUNTER — Other Ambulatory Visit (HOSPITAL_BASED_OUTPATIENT_CLINIC_OR_DEPARTMENT_OTHER): Payer: Self-pay

## 2021-09-27 DIAGNOSIS — M2560 Stiffness of unspecified joint, not elsewhere classified: Secondary | ICD-10-CM | POA: Diagnosis not present

## 2021-09-27 DIAGNOSIS — M545 Low back pain, unspecified: Secondary | ICD-10-CM | POA: Diagnosis not present

## 2021-09-27 DIAGNOSIS — M6281 Muscle weakness (generalized): Secondary | ICD-10-CM | POA: Diagnosis not present

## 2021-09-28 ENCOUNTER — Other Ambulatory Visit (HOSPITAL_BASED_OUTPATIENT_CLINIC_OR_DEPARTMENT_OTHER): Payer: Self-pay

## 2021-09-29 DIAGNOSIS — M6281 Muscle weakness (generalized): Secondary | ICD-10-CM | POA: Diagnosis not present

## 2021-09-29 DIAGNOSIS — M2560 Stiffness of unspecified joint, not elsewhere classified: Secondary | ICD-10-CM | POA: Diagnosis not present

## 2021-09-29 DIAGNOSIS — M545 Low back pain, unspecified: Secondary | ICD-10-CM | POA: Diagnosis not present

## 2021-10-04 DIAGNOSIS — M2560 Stiffness of unspecified joint, not elsewhere classified: Secondary | ICD-10-CM | POA: Diagnosis not present

## 2021-10-04 DIAGNOSIS — M6281 Muscle weakness (generalized): Secondary | ICD-10-CM | POA: Diagnosis not present

## 2021-10-04 DIAGNOSIS — M545 Low back pain, unspecified: Secondary | ICD-10-CM | POA: Diagnosis not present

## 2021-10-06 ENCOUNTER — Other Ambulatory Visit (HOSPITAL_BASED_OUTPATIENT_CLINIC_OR_DEPARTMENT_OTHER): Payer: Self-pay

## 2021-10-06 DIAGNOSIS — M2560 Stiffness of unspecified joint, not elsewhere classified: Secondary | ICD-10-CM | POA: Diagnosis not present

## 2021-10-06 DIAGNOSIS — M6281 Muscle weakness (generalized): Secondary | ICD-10-CM | POA: Diagnosis not present

## 2021-10-06 DIAGNOSIS — M545 Low back pain, unspecified: Secondary | ICD-10-CM | POA: Diagnosis not present

## 2021-10-07 ENCOUNTER — Other Ambulatory Visit (HOSPITAL_BASED_OUTPATIENT_CLINIC_OR_DEPARTMENT_OTHER): Payer: Self-pay

## 2021-10-11 ENCOUNTER — Other Ambulatory Visit (HOSPITAL_BASED_OUTPATIENT_CLINIC_OR_DEPARTMENT_OTHER): Payer: Self-pay

## 2021-10-11 DIAGNOSIS — M545 Low back pain, unspecified: Secondary | ICD-10-CM | POA: Diagnosis not present

## 2021-10-11 DIAGNOSIS — M6281 Muscle weakness (generalized): Secondary | ICD-10-CM | POA: Diagnosis not present

## 2021-10-11 DIAGNOSIS — M2560 Stiffness of unspecified joint, not elsewhere classified: Secondary | ICD-10-CM | POA: Diagnosis not present

## 2021-10-13 DIAGNOSIS — M6281 Muscle weakness (generalized): Secondary | ICD-10-CM | POA: Diagnosis not present

## 2021-10-13 DIAGNOSIS — M545 Low back pain, unspecified: Secondary | ICD-10-CM | POA: Diagnosis not present

## 2021-10-13 DIAGNOSIS — M2560 Stiffness of unspecified joint, not elsewhere classified: Secondary | ICD-10-CM | POA: Diagnosis not present

## 2021-10-18 ENCOUNTER — Other Ambulatory Visit (HOSPITAL_BASED_OUTPATIENT_CLINIC_OR_DEPARTMENT_OTHER): Payer: Self-pay

## 2021-10-18 DIAGNOSIS — M2560 Stiffness of unspecified joint, not elsewhere classified: Secondary | ICD-10-CM | POA: Diagnosis not present

## 2021-10-18 DIAGNOSIS — M5136 Other intervertebral disc degeneration, lumbar region: Secondary | ICD-10-CM | POA: Diagnosis not present

## 2021-10-18 DIAGNOSIS — M5416 Radiculopathy, lumbar region: Secondary | ICD-10-CM | POA: Diagnosis not present

## 2021-10-18 DIAGNOSIS — M47816 Spondylosis without myelopathy or radiculopathy, lumbar region: Secondary | ICD-10-CM | POA: Diagnosis not present

## 2021-10-18 DIAGNOSIS — M6281 Muscle weakness (generalized): Secondary | ICD-10-CM | POA: Diagnosis not present

## 2021-10-18 DIAGNOSIS — M545 Low back pain, unspecified: Secondary | ICD-10-CM | POA: Diagnosis not present

## 2021-10-18 MED ORDER — METHOCARBAMOL 500 MG PO TABS
500.0000 mg | ORAL_TABLET | Freq: Four times a day (QID) | ORAL | 3 refills | Status: DC | PRN
  Filled 2021-10-18: qty 120, 30d supply, fill #0
  Filled 2021-11-30: qty 120, 30d supply, fill #1
  Filled 2021-12-14: qty 30, 8d supply, fill #2

## 2021-10-19 ENCOUNTER — Other Ambulatory Visit (HOSPITAL_BASED_OUTPATIENT_CLINIC_OR_DEPARTMENT_OTHER): Payer: Self-pay

## 2021-10-20 DIAGNOSIS — M2560 Stiffness of unspecified joint, not elsewhere classified: Secondary | ICD-10-CM | POA: Diagnosis not present

## 2021-10-20 DIAGNOSIS — M545 Low back pain, unspecified: Secondary | ICD-10-CM | POA: Diagnosis not present

## 2021-10-20 DIAGNOSIS — M6281 Muscle weakness (generalized): Secondary | ICD-10-CM | POA: Diagnosis not present

## 2021-10-26 ENCOUNTER — Other Ambulatory Visit (HOSPITAL_BASED_OUTPATIENT_CLINIC_OR_DEPARTMENT_OTHER): Payer: Self-pay

## 2021-10-26 DIAGNOSIS — M545 Low back pain, unspecified: Secondary | ICD-10-CM | POA: Diagnosis not present

## 2021-10-26 DIAGNOSIS — M6281 Muscle weakness (generalized): Secondary | ICD-10-CM | POA: Diagnosis not present

## 2021-10-26 DIAGNOSIS — M2560 Stiffness of unspecified joint, not elsewhere classified: Secondary | ICD-10-CM | POA: Diagnosis not present

## 2021-10-27 ENCOUNTER — Other Ambulatory Visit (HOSPITAL_BASED_OUTPATIENT_CLINIC_OR_DEPARTMENT_OTHER): Payer: Self-pay

## 2021-10-31 ENCOUNTER — Other Ambulatory Visit (HOSPITAL_BASED_OUTPATIENT_CLINIC_OR_DEPARTMENT_OTHER): Payer: Self-pay

## 2021-10-31 DIAGNOSIS — Z0001 Encounter for general adult medical examination with abnormal findings: Secondary | ICD-10-CM | POA: Diagnosis not present

## 2021-10-31 DIAGNOSIS — Z131 Encounter for screening for diabetes mellitus: Secondary | ICD-10-CM | POA: Diagnosis not present

## 2021-10-31 DIAGNOSIS — G43009 Migraine without aura, not intractable, without status migrainosus: Secondary | ICD-10-CM | POA: Diagnosis not present

## 2021-10-31 DIAGNOSIS — R739 Hyperglycemia, unspecified: Secondary | ICD-10-CM | POA: Diagnosis not present

## 2021-10-31 DIAGNOSIS — Z125 Encounter for screening for malignant neoplasm of prostate: Secondary | ICD-10-CM | POA: Diagnosis not present

## 2021-10-31 DIAGNOSIS — F4323 Adjustment disorder with mixed anxiety and depressed mood: Secondary | ICD-10-CM | POA: Diagnosis not present

## 2021-10-31 DIAGNOSIS — R7303 Prediabetes: Secondary | ICD-10-CM | POA: Diagnosis not present

## 2021-10-31 DIAGNOSIS — I10 Essential (primary) hypertension: Secondary | ICD-10-CM | POA: Diagnosis not present

## 2021-10-31 DIAGNOSIS — E782 Mixed hyperlipidemia: Secondary | ICD-10-CM | POA: Diagnosis not present

## 2021-11-01 DIAGNOSIS — M2560 Stiffness of unspecified joint, not elsewhere classified: Secondary | ICD-10-CM | POA: Diagnosis not present

## 2021-11-01 DIAGNOSIS — M6281 Muscle weakness (generalized): Secondary | ICD-10-CM | POA: Diagnosis not present

## 2021-11-01 DIAGNOSIS — M545 Low back pain, unspecified: Secondary | ICD-10-CM | POA: Diagnosis not present

## 2021-11-02 ENCOUNTER — Other Ambulatory Visit (HOSPITAL_BASED_OUTPATIENT_CLINIC_OR_DEPARTMENT_OTHER): Payer: Self-pay

## 2021-11-02 MED ORDER — METHOCARBAMOL 500 MG PO TABS
500.0000 mg | ORAL_TABLET | Freq: Three times a day (TID) | ORAL | 1 refills | Status: DC | PRN
  Filled 2021-11-02 – 2022-01-11 (×2): qty 90, 30d supply, fill #0
  Filled 2022-02-12: qty 90, 30d supply, fill #1

## 2021-11-02 MED ORDER — LOSARTAN POTASSIUM 25 MG PO TABS
50.0000 mg | ORAL_TABLET | Freq: Every day | ORAL | 0 refills | Status: DC
Start: 2021-11-02 — End: 2022-05-08
  Filled 2021-11-02: qty 180, 90d supply, fill #0

## 2021-11-02 MED ORDER — OMEGA-3-ACID ETHYL ESTERS 1 G PO CAPS
2.0000 g | ORAL_CAPSULE | Freq: Two times a day (BID) | ORAL | 0 refills | Status: DC
  Filled 2021-11-02 – 2022-01-19 (×3): qty 360, 90d supply, fill #0

## 2021-11-02 MED ORDER — TAMSULOSIN HCL 0.4 MG PO CAPS
0.4000 mg | ORAL_CAPSULE | Freq: Every day | ORAL | 0 refills | Status: DC
  Filled 2021-11-02: qty 90, 90d supply, fill #0

## 2021-11-02 MED ORDER — GEMTESA 75 MG PO TABS
75.0000 mg | ORAL_TABLET | Freq: Every day | ORAL | 0 refills | Status: DC
  Filled 2021-11-02 – 2022-01-16 (×2): qty 90, 90d supply, fill #0

## 2021-11-02 MED ORDER — ATORVASTATIN CALCIUM 10 MG PO TABS
10.0000 mg | ORAL_TABLET | Freq: Every day | ORAL | 0 refills | Status: DC
Start: 2021-11-02 — End: 2022-05-08
  Filled 2021-11-02: qty 90, 90d supply, fill #0

## 2021-11-03 DIAGNOSIS — M6281 Muscle weakness (generalized): Secondary | ICD-10-CM | POA: Diagnosis not present

## 2021-11-03 DIAGNOSIS — M2560 Stiffness of unspecified joint, not elsewhere classified: Secondary | ICD-10-CM | POA: Diagnosis not present

## 2021-11-03 DIAGNOSIS — M545 Low back pain, unspecified: Secondary | ICD-10-CM | POA: Diagnosis not present

## 2021-11-04 ENCOUNTER — Other Ambulatory Visit (HOSPITAL_BASED_OUTPATIENT_CLINIC_OR_DEPARTMENT_OTHER): Payer: Self-pay

## 2021-11-04 DIAGNOSIS — G43009 Migraine without aura, not intractable, without status migrainosus: Secondary | ICD-10-CM | POA: Diagnosis not present

## 2021-11-04 DIAGNOSIS — E782 Mixed hyperlipidemia: Secondary | ICD-10-CM | POA: Diagnosis not present

## 2021-11-04 DIAGNOSIS — R7303 Prediabetes: Secondary | ICD-10-CM | POA: Diagnosis not present

## 2021-11-04 DIAGNOSIS — I10 Essential (primary) hypertension: Secondary | ICD-10-CM | POA: Diagnosis not present

## 2021-11-04 MED ORDER — ONDANSETRON 4 MG PO TBDP
4.0000 mg | ORAL_TABLET | Freq: Three times a day (TID) | ORAL | 0 refills | Status: AC | PRN
  Filled 2021-11-04: qty 90, 30d supply, fill #0

## 2021-11-07 ENCOUNTER — Other Ambulatory Visit (HOSPITAL_BASED_OUTPATIENT_CLINIC_OR_DEPARTMENT_OTHER): Payer: Self-pay

## 2021-11-07 MED ORDER — ERGOCALCIFEROL 1.25 MG (50000 UT) PO CAPS
50000.0000 [IU] | ORAL_CAPSULE | ORAL | 5 refills | Status: AC
  Filled 2021-11-07: qty 4, 28d supply, fill #0

## 2021-11-08 ENCOUNTER — Other Ambulatory Visit (HOSPITAL_BASED_OUTPATIENT_CLINIC_OR_DEPARTMENT_OTHER): Payer: Self-pay

## 2021-11-09 DIAGNOSIS — M5416 Radiculopathy, lumbar region: Secondary | ICD-10-CM | POA: Diagnosis not present

## 2021-11-09 DIAGNOSIS — M5126 Other intervertebral disc displacement, lumbar region: Secondary | ICD-10-CM | POA: Diagnosis not present

## 2021-11-16 ENCOUNTER — Other Ambulatory Visit (HOSPITAL_BASED_OUTPATIENT_CLINIC_OR_DEPARTMENT_OTHER): Payer: Self-pay

## 2021-11-17 ENCOUNTER — Other Ambulatory Visit (HOSPITAL_BASED_OUTPATIENT_CLINIC_OR_DEPARTMENT_OTHER): Payer: Self-pay

## 2021-11-17 MED ORDER — AREXVY 120 MCG/0.5ML IM SUSR
INTRAMUSCULAR | 0 refills | Status: DC
  Filled 2021-11-17: qty 0.5, 1d supply, fill #0

## 2021-11-21 ENCOUNTER — Other Ambulatory Visit (HOSPITAL_BASED_OUTPATIENT_CLINIC_OR_DEPARTMENT_OTHER): Payer: Self-pay

## 2021-11-29 ENCOUNTER — Other Ambulatory Visit (HOSPITAL_BASED_OUTPATIENT_CLINIC_OR_DEPARTMENT_OTHER): Payer: Self-pay

## 2021-11-29 DIAGNOSIS — M5136 Other intervertebral disc degeneration, lumbar region: Secondary | ICD-10-CM | POA: Diagnosis not present

## 2021-11-29 DIAGNOSIS — M5416 Radiculopathy, lumbar region: Secondary | ICD-10-CM | POA: Diagnosis not present

## 2021-11-29 DIAGNOSIS — M47816 Spondylosis without myelopathy or radiculopathy, lumbar region: Secondary | ICD-10-CM | POA: Diagnosis not present

## 2021-11-29 MED ORDER — PREGABALIN 50 MG PO CAPS
50.0000 mg | ORAL_CAPSULE | Freq: Three times a day (TID) | ORAL | 6 refills | Status: DC
Start: 2021-11-29 — End: 2022-01-02
  Filled 2021-11-29: qty 90, 30d supply, fill #0
  Filled 2022-01-02: qty 90, 30d supply, fill #1

## 2021-11-30 ENCOUNTER — Other Ambulatory Visit (HOSPITAL_BASED_OUTPATIENT_CLINIC_OR_DEPARTMENT_OTHER): Payer: Self-pay

## 2021-12-02 ENCOUNTER — Other Ambulatory Visit (HOSPITAL_BASED_OUTPATIENT_CLINIC_OR_DEPARTMENT_OTHER): Payer: Self-pay

## 2021-12-02 MED ORDER — OMEGA-3-ACID ETHYL ESTERS 1 G PO CAPS
2.0000 g | ORAL_CAPSULE | Freq: Two times a day (BID) | ORAL | 3 refills | Status: DC
  Filled 2021-12-02: qty 360, 90d supply, fill #0

## 2021-12-05 ENCOUNTER — Other Ambulatory Visit (HOSPITAL_BASED_OUTPATIENT_CLINIC_OR_DEPARTMENT_OTHER): Payer: Self-pay

## 2021-12-06 DIAGNOSIS — Z0001 Encounter for general adult medical examination with abnormal findings: Secondary | ICD-10-CM | POA: Diagnosis not present

## 2021-12-06 DIAGNOSIS — E782 Mixed hyperlipidemia: Secondary | ICD-10-CM | POA: Diagnosis not present

## 2021-12-06 DIAGNOSIS — I10 Essential (primary) hypertension: Secondary | ICD-10-CM | POA: Diagnosis not present

## 2021-12-06 DIAGNOSIS — G43009 Migraine without aura, not intractable, without status migrainosus: Secondary | ICD-10-CM | POA: Diagnosis not present

## 2021-12-06 DIAGNOSIS — R7303 Prediabetes: Secondary | ICD-10-CM | POA: Diagnosis not present

## 2021-12-13 DIAGNOSIS — F4323 Adjustment disorder with mixed anxiety and depressed mood: Secondary | ICD-10-CM | POA: Diagnosis not present

## 2021-12-14 ENCOUNTER — Other Ambulatory Visit (HOSPITAL_BASED_OUTPATIENT_CLINIC_OR_DEPARTMENT_OTHER): Payer: Self-pay

## 2021-12-15 ENCOUNTER — Other Ambulatory Visit (HOSPITAL_BASED_OUTPATIENT_CLINIC_OR_DEPARTMENT_OTHER): Payer: Self-pay

## 2021-12-16 DIAGNOSIS — N401 Enlarged prostate with lower urinary tract symptoms: Secondary | ICD-10-CM | POA: Diagnosis not present

## 2021-12-16 DIAGNOSIS — N138 Other obstructive and reflux uropathy: Secondary | ICD-10-CM | POA: Diagnosis not present

## 2021-12-21 ENCOUNTER — Other Ambulatory Visit (HOSPITAL_BASED_OUTPATIENT_CLINIC_OR_DEPARTMENT_OTHER): Payer: Self-pay

## 2021-12-21 DIAGNOSIS — G43019 Migraine without aura, intractable, without status migrainosus: Secondary | ICD-10-CM | POA: Diagnosis not present

## 2021-12-21 MED ORDER — NURTEC 75 MG PO TBDP
75.0000 mg | ORAL_TABLET | Freq: Every day | ORAL | 5 refills | Status: DC
  Filled 2022-01-02: qty 8, 30d supply, fill #0
  Filled 2022-01-27 (×2): qty 8, 30d supply, fill #1
  Filled 2022-02-22: qty 8, 30d supply, fill #2

## 2022-01-02 ENCOUNTER — Other Ambulatory Visit (HOSPITAL_BASED_OUTPATIENT_CLINIC_OR_DEPARTMENT_OTHER): Payer: Self-pay

## 2022-01-02 DIAGNOSIS — F4323 Adjustment disorder with mixed anxiety and depressed mood: Secondary | ICD-10-CM | POA: Diagnosis not present

## 2022-01-02 MED ORDER — PREGABALIN 50 MG PO CAPS
50.0000 mg | ORAL_CAPSULE | Freq: Three times a day (TID) | ORAL | 0 refills | Status: DC
  Filled 2022-01-02: qty 270, 90d supply, fill #0

## 2022-01-11 ENCOUNTER — Other Ambulatory Visit (HOSPITAL_BASED_OUTPATIENT_CLINIC_OR_DEPARTMENT_OTHER): Payer: Self-pay

## 2022-01-11 MED ORDER — TIZANIDINE HCL 4 MG PO TABS
4.0000 mg | ORAL_TABLET | Freq: Four times a day (QID) | ORAL | 2 refills | Status: DC | PRN
  Filled 2022-01-11: qty 90, 23d supply, fill #0
  Filled 2022-03-27: qty 90, 23d supply, fill #1
  Filled 2022-06-05 – 2022-07-17 (×2): qty 90, 23d supply, fill #2

## 2022-01-16 ENCOUNTER — Other Ambulatory Visit (HOSPITAL_BASED_OUTPATIENT_CLINIC_OR_DEPARTMENT_OTHER): Payer: Self-pay

## 2022-01-17 ENCOUNTER — Other Ambulatory Visit (HOSPITAL_BASED_OUTPATIENT_CLINIC_OR_DEPARTMENT_OTHER): Payer: Self-pay

## 2022-01-17 DIAGNOSIS — F4323 Adjustment disorder with mixed anxiety and depressed mood: Secondary | ICD-10-CM | POA: Diagnosis not present

## 2022-01-18 ENCOUNTER — Other Ambulatory Visit (HOSPITAL_BASED_OUTPATIENT_CLINIC_OR_DEPARTMENT_OTHER): Payer: Self-pay

## 2022-01-19 ENCOUNTER — Other Ambulatory Visit (HOSPITAL_BASED_OUTPATIENT_CLINIC_OR_DEPARTMENT_OTHER): Payer: Self-pay

## 2022-01-20 ENCOUNTER — Other Ambulatory Visit (HOSPITAL_BASED_OUTPATIENT_CLINIC_OR_DEPARTMENT_OTHER): Payer: Self-pay

## 2022-01-27 ENCOUNTER — Other Ambulatory Visit (HOSPITAL_BASED_OUTPATIENT_CLINIC_OR_DEPARTMENT_OTHER): Payer: Self-pay

## 2022-01-27 MED ORDER — NURTEC 75 MG PO TBDP
75.0000 mg | ORAL_TABLET | Freq: Every day | ORAL | 5 refills | Status: DC | PRN
  Filled 2022-01-27: qty 8, 8d supply, fill #0

## 2022-01-27 MED ORDER — NURTEC 75 MG PO TBDP
75.0000 mg | ORAL_TABLET | Freq: Every day | ORAL | 5 refills | Status: AC | PRN
  Filled 2022-01-27: qty 8, 8d supply, fill #0

## 2022-02-08 ENCOUNTER — Other Ambulatory Visit (HOSPITAL_BASED_OUTPATIENT_CLINIC_OR_DEPARTMENT_OTHER): Payer: Self-pay

## 2022-02-08 MED ORDER — NURTEC 75 MG PO TBDP
75.0000 mg | ORAL_TABLET | Freq: Every day | ORAL | 5 refills | Status: DC | PRN
  Filled 2022-02-08: qty 8, 8d supply, fill #0

## 2022-02-10 DIAGNOSIS — N503 Cyst of epididymis: Secondary | ICD-10-CM | POA: Diagnosis not present

## 2022-02-10 DIAGNOSIS — N433 Hydrocele, unspecified: Secondary | ICD-10-CM | POA: Diagnosis not present

## 2022-02-10 DIAGNOSIS — N50812 Left testicular pain: Secondary | ICD-10-CM | POA: Diagnosis not present

## 2022-02-20 DIAGNOSIS — M5136 Other intervertebral disc degeneration, lumbar region: Secondary | ICD-10-CM | POA: Diagnosis not present

## 2022-02-20 DIAGNOSIS — M47816 Spondylosis without myelopathy or radiculopathy, lumbar region: Secondary | ICD-10-CM | POA: Diagnosis not present

## 2022-02-20 DIAGNOSIS — M722 Plantar fascial fibromatosis: Secondary | ICD-10-CM | POA: Diagnosis not present

## 2022-02-20 DIAGNOSIS — M5416 Radiculopathy, lumbar region: Secondary | ICD-10-CM | POA: Diagnosis not present

## 2022-02-22 ENCOUNTER — Other Ambulatory Visit (HOSPITAL_COMMUNITY): Payer: Self-pay

## 2022-02-22 ENCOUNTER — Other Ambulatory Visit (HOSPITAL_BASED_OUTPATIENT_CLINIC_OR_DEPARTMENT_OTHER): Payer: Self-pay

## 2022-03-06 ENCOUNTER — Other Ambulatory Visit (HOSPITAL_BASED_OUTPATIENT_CLINIC_OR_DEPARTMENT_OTHER): Payer: Self-pay

## 2022-03-06 MED ORDER — LIDOCAINE VISCOUS HCL 2 % MT SOLN
OROMUCOSAL | 0 refills | Status: DC
  Filled 2022-03-06: qty 100, 3d supply, fill #0

## 2022-03-06 MED ORDER — ACETAMINOPHEN-CODEINE 300-30 MG PO TABS
1.0000 | ORAL_TABLET | Freq: Four times a day (QID) | ORAL | 0 refills | Status: DC | PRN
  Filled 2022-03-06: qty 5, 2d supply, fill #0

## 2022-03-06 MED ORDER — CHLORHEXIDINE GLUCONATE 0.12 % MT SOLN
15.0000 mL | Freq: Two times a day (BID) | OROMUCOSAL | 0 refills | Status: DC
  Filled 2022-03-06: qty 473, 16d supply, fill #0

## 2022-03-17 ENCOUNTER — Other Ambulatory Visit (HOSPITAL_BASED_OUTPATIENT_CLINIC_OR_DEPARTMENT_OTHER): Payer: Self-pay

## 2022-03-17 DIAGNOSIS — G43019 Migraine without aura, intractable, without status migrainosus: Secondary | ICD-10-CM | POA: Diagnosis not present

## 2022-03-17 DIAGNOSIS — R42 Dizziness and giddiness: Secondary | ICD-10-CM | POA: Diagnosis not present

## 2022-03-17 MED ORDER — NURTEC 75 MG PO TBDP
75.0000 mg | ORAL_TABLET | Freq: Every day | ORAL | 5 refills | Status: DC
  Filled 2022-03-17: qty 8, 30d supply, fill #0

## 2022-03-20 ENCOUNTER — Other Ambulatory Visit (HOSPITAL_BASED_OUTPATIENT_CLINIC_OR_DEPARTMENT_OTHER): Payer: Self-pay

## 2022-03-21 ENCOUNTER — Other Ambulatory Visit (HOSPITAL_BASED_OUTPATIENT_CLINIC_OR_DEPARTMENT_OTHER): Payer: Self-pay

## 2022-03-23 ENCOUNTER — Other Ambulatory Visit: Payer: Self-pay

## 2022-03-23 ENCOUNTER — Other Ambulatory Visit (HOSPITAL_BASED_OUTPATIENT_CLINIC_OR_DEPARTMENT_OTHER): Payer: Self-pay

## 2022-03-27 ENCOUNTER — Other Ambulatory Visit (HOSPITAL_BASED_OUTPATIENT_CLINIC_OR_DEPARTMENT_OTHER): Payer: Self-pay

## 2022-03-27 DIAGNOSIS — E782 Mixed hyperlipidemia: Secondary | ICD-10-CM | POA: Diagnosis not present

## 2022-03-27 DIAGNOSIS — R7303 Prediabetes: Secondary | ICD-10-CM | POA: Diagnosis not present

## 2022-03-27 DIAGNOSIS — Z0001 Encounter for general adult medical examination with abnormal findings: Secondary | ICD-10-CM | POA: Diagnosis not present

## 2022-03-27 DIAGNOSIS — I10 Essential (primary) hypertension: Secondary | ICD-10-CM | POA: Diagnosis not present

## 2022-03-27 DIAGNOSIS — G43009 Migraine without aura, not intractable, without status migrainosus: Secondary | ICD-10-CM | POA: Diagnosis not present

## 2022-03-27 DIAGNOSIS — J209 Acute bronchitis, unspecified: Secondary | ICD-10-CM | POA: Diagnosis not present

## 2022-03-27 DIAGNOSIS — R001 Bradycardia, unspecified: Secondary | ICD-10-CM | POA: Diagnosis not present

## 2022-03-27 MED ORDER — TIZANIDINE HCL 4 MG PO TABS
4.0000 mg | ORAL_TABLET | Freq: Three times a day (TID) | ORAL | 2 refills | Status: DC | PRN
  Filled 2022-03-27: qty 90, 30d supply, fill #0

## 2022-03-27 MED ORDER — AZITHROMYCIN 250 MG PO TABS
ORAL_TABLET | ORAL | 0 refills | Status: AC
  Filled 2022-03-27: qty 6, 5d supply, fill #0

## 2022-03-27 MED ORDER — ATORVASTATIN CALCIUM 10 MG PO TABS
10.0000 mg | ORAL_TABLET | Freq: Every day | ORAL | 1 refills | Status: DC
  Filled 2022-03-27: qty 90, 90d supply, fill #0
  Filled 2022-04-17: qty 90, 90d supply, fill #1

## 2022-03-27 MED ORDER — BENZONATATE 200 MG PO CAPS
200.0000 mg | ORAL_CAPSULE | Freq: Three times a day (TID) | ORAL | 0 refills | Status: DC
  Filled 2022-03-27: qty 15, 5d supply, fill #0

## 2022-03-27 MED ORDER — LOSARTAN POTASSIUM 25 MG PO TABS
50.0000 mg | ORAL_TABLET | Freq: Every day | ORAL | 1 refills | Status: DC
  Filled 2022-03-27: qty 180, 90d supply, fill #0

## 2022-03-28 ENCOUNTER — Other Ambulatory Visit (HOSPITAL_BASED_OUTPATIENT_CLINIC_OR_DEPARTMENT_OTHER): Payer: Self-pay

## 2022-03-29 ENCOUNTER — Other Ambulatory Visit (HOSPITAL_BASED_OUTPATIENT_CLINIC_OR_DEPARTMENT_OTHER): Payer: Self-pay

## 2022-03-29 ENCOUNTER — Emergency Department (HOSPITAL_BASED_OUTPATIENT_CLINIC_OR_DEPARTMENT_OTHER): Payer: Medicare Other

## 2022-03-29 ENCOUNTER — Encounter (HOSPITAL_BASED_OUTPATIENT_CLINIC_OR_DEPARTMENT_OTHER): Payer: Self-pay | Admitting: Radiology

## 2022-03-29 ENCOUNTER — Other Ambulatory Visit: Payer: Self-pay

## 2022-03-29 ENCOUNTER — Emergency Department (HOSPITAL_BASED_OUTPATIENT_CLINIC_OR_DEPARTMENT_OTHER)
Admission: EM | Admit: 2022-03-29 | Discharge: 2022-03-29 | Disposition: A | Discharge status: Home / Self Care | Payer: Medicare Other | Attending: Emergency Medicine | Admitting: Emergency Medicine

## 2022-03-29 DIAGNOSIS — R079 Chest pain, unspecified: Secondary | ICD-10-CM | POA: Diagnosis not present

## 2022-03-29 DIAGNOSIS — R0789 Other chest pain: Secondary | ICD-10-CM | POA: Diagnosis not present

## 2022-03-29 LAB — CBC
HCT: 43.7 % (ref 39.0–52.0)
Hemoglobin: 14.5 g/dL (ref 13.0–17.0)
MCH: 31.1 pg (ref 26.0–34.0)
MCHC: 33.2 g/dL (ref 30.0–36.0)
MCV: 93.8 fL (ref 80.0–100.0)
Platelets: 251 10*3/uL (ref 150–400)
RBC: 4.66 MIL/uL (ref 4.22–5.81)
RDW: 13.3 % (ref 11.5–15.5)
WBC: 5.3 10*3/uL (ref 4.0–10.5)
nRBC: 0 % (ref 0.0–0.2)

## 2022-03-29 LAB — BASIC METABOLIC PANEL
Anion gap: 6 (ref 5–15)
BUN: 21 mg/dL (ref 8–23)
CO2: 25 mmol/L (ref 22–32)
Calcium: 9 mg/dL (ref 8.9–10.3)
Chloride: 105 mmol/L (ref 98–111)
Creatinine, Ser: 1.18 mg/dL (ref 0.61–1.24)
GFR, Estimated: >60 mL/min (ref >60)
Glucose, Bld: 118 mg/dL — ABNORMAL HIGH (ref 70–99)
Potassium: 4.1 mmol/L (ref 3.5–5.1)
Sodium: 136 mmol/L (ref 135–145)

## 2022-03-29 LAB — TROPONIN I (HIGH SENSITIVITY)
Troponin I (High Sensitivity): 2 ng/L (ref <18)
Troponin I (High Sensitivity): 3 ng/L (ref <18)

## 2022-03-29 MED ORDER — FAMOTIDINE 20 MG PO TABS
20.0000 mg | ORAL_TABLET | Freq: Once | ORAL | Status: AC
  Administered 2022-03-29: 20 mg via ORAL
  Filled 2022-03-29: qty 1

## 2022-03-29 MED ORDER — ACETAMINOPHEN 500 MG PO TABS
1000.0000 mg | ORAL_TABLET | Freq: Once | ORAL | Status: AC
  Administered 2022-03-29: 1000 mg via ORAL
  Filled 2022-03-29: qty 2

## 2022-03-29 MED ORDER — KETOROLAC TROMETHAMINE 15 MG/ML IJ SOLN
15.0000 mg | Freq: Once | INTRAMUSCULAR | Status: AC
  Administered 2022-03-29: 15 mg via INTRAVENOUS
  Filled 2022-03-29: qty 1

## 2022-03-29 NOTE — ED Provider Notes (Signed)
[place] [facility] [place] [facility] Provider Note   CSN: [id number] Arrival date & time: 03/29/22  2027     History Chief Complaint  Patient presents with   Chest Pain    HPI Kenneth Melendez is a 67 y.o. male presenting for chest pain.  He is a 67 year old male with an extensive medical history.  He states that he has had chest pain over the past 24 hours intermittently.   Patient's recorded medical, surgical, social, medication list and allergies were reviewed in the Snapshot window as part of the initial history.   Review of Systems   Review of Systems  Constitutional:  Negative for chills and fever.  HENT:  Negative for ear pain and sore throat.   Eyes:  Negative for pain and visual disturbance.  Respiratory:  Negative for cough and shortness of breath.   Cardiovascular:  Positive for chest pain. Negative for palpitations.  Gastrointestinal:  Negative for abdominal pain and vomiting.  Genitourinary:  Negative for dysuria and hematuria.  Musculoskeletal:  Negative for arthralgias and back pain.  Skin:  Negative for color change and rash.  Neurological:  Negative for seizures and syncope.  All other systems reviewed and are negative.   Physical Exam Updated Vital Signs BP (!) 149/81 (BP Location: Right Arm)   Pulse (!) 52   Temp 97.8 F (36.6 C)   Resp 20   Ht 6' 3"$  (1.905 m)   Wt 113.4 kg   SpO2 95%   BMI 31.25 kg/m  Physical Exam Vitals and nursing note reviewed.  Constitutional:      General: He is not in acute distress.    Appearance: He is well-developed.  HENT:     Head: Normocephalic and atraumatic.  Eyes:     Conjunctiva/sclera: Conjunctivae normal.  Cardiovascular:     Rate and Rhythm: Normal rate and regular rhythm.     Heart sounds: No murmur heard. Pulmonary:     Effort: Pulmonary effort is normal. No respiratory distress.     Breath sounds: Normal breath sounds.  Abdominal:     Palpations: Abdomen is soft.      Tenderness: There is no abdominal tenderness.  Musculoskeletal:        General: No swelling.     Cervical back: Neck supple.  Skin:    General: Skin is warm and dry.     Capillary Refill: Capillary refill takes less than 2 seconds.  Neurological:     Mental Status: He is alert.  Psychiatric:        Mood and Affect: Mood normal.      ED Course/ Medical Decision Making/ A&P    Procedures Procedures   Medications Ordered in ED Medications  acetaminophen (TYLENOL) tablet 1,000 mg (1,000 mg Oral Given 03/29/22 2158)  ketorolac (TORADOL) 15 MG/ML injection 15 mg (15 mg Intravenous Given 03/29/22 2204)  famotidine (PEPCID) tablet 20 mg (20 mg Oral Given 03/29/22 2200)  Medical Decision Making: Kenneth Melendez is a 67 y.o. male who presented to the ED today with chest pain, detailed above.  Based on patient's comorbidities, patient has a heart score of 4.    Patient's presentation is complicated by their history of multiple comorbid medical problems.  Patient placed on continuous vitals and telemetry monitoring while in ED which was reviewed periodically.  Complete initial physical exam performed, notably the patient was hemodynamically stable in no acute distress.   Reviewed and confirmed nursing documentation for past medical history, family  history, social history.    Initial Assessment: With the patient's presentation of left-sided chest pain, most likely diagnosis is musculoskeletal chest pain versus GERD, although ACS remains on the differential. Other diagnoses were considered including (but not limited to) pulmonary embolism, community-acquired pneumonia, aortic dissection, pneumothorax, underlying bony abnormality, anemia. These are considered less likely due to history of present illness and physical exam findings.    In particular, concerning pulmonary embolism: they deny malignancy, recent surgery, history of DVT, or calf tenderness leading to a low risk Wells score. Aortic  Dissection also reconsidered but seems less likely based on the location, quality, onset, and severity of symptoms in this case. Patient has a lack of serious comorbidities for this condition including a lack of HTN or Smoking. Patient also has a lack of underlying history of AD or TAA.  This is most consistent with an acute life/limb threatening illness complicated by underlying chronic conditions.   Initial Plan: Evaluate for ACS with delta troponin and EKG evaluated as below  Evaluate for dissection, bony abnormality, or pneumonia with chest x-ray and screening laboratory evaluation including CBC, BMP  Further evaluation for pulmonary embolism not indicated at this time based on patient's PERC and Wells score.  Further evaluation for Thoracic Aortic Dissection not indicated at this time based on patient's clinical history and PE findings.   Initial Study Results: EKG was reviewed independently. Rate, rhythm, axis, intervals all examined and without medically relevant abnormality. ST segments without concerns for elevations.    Laboratory  Delta troponin demonstrated no acute abnormality  CBC and BMP without obvious metabolic or inflammatory abnormalities requiring further evaluation   Radiology  DG Chest 2 View  Result Date: 03/29/2022 CLINICAL DATA:  Chest pain starting today. EXAM: CHEST - 2 VIEW COMPARISON:  11/26/2020 FINDINGS: The heart size and mediastinal contours are within normal limits. Both lungs are clear. The visualized skeletal structures are unremarkable. IMPRESSION: No active cardiopulmonary disease. Electronically Signed   By: Lucienne Capers M.D.   On: 03/29/2022 21:00    Final Assessment and Plan: On reassessment, patient's had complete symptomatic resolution.  Observed in emergency department for 2-1/2 hours.  Serial troponins negative, metabolic panel reassuring, chest x-ray with no focal pathology and symptoms remain resolved on reassessment. Uncertain underlying  etiology of symptoms.  He does have slightly elevated risk of heart disease because of family history of CAD.  We could consider observation, however I discussed that given resolution of symptoms, outpatient follow-up with cardiology would likely be reasonable.  Patient would prefer for outpatient follow-up with cardiology at this time.  No acute indication for further intervention at this time.  Patient discharged with no further acute events.  Disposition:  I have considered need for hospitalization, however, considering all of the above, I believe this patient is stable for discharge at this time.  Patient/family educated about specific return precautions for given chief complaint and symptoms.  Patient/family educated about follow-up with PCP and cardiology.     Patient/family expressed understanding of return precautions and need for follow-up. Patient spoken to regarding all imaging and laboratory results and appropriate follow up for these results. All education provided in verbal form with additional information in written form. Time was allowed for answering of patient questions. Patient discharged.    Emergency Department Medication Summary:   Medications  acetaminophen (TYLENOL) tablet 1,000 mg (1,000 mg Oral Given 03/29/22 2158)  ketorolac (TORADOL) 15 MG/ML injection 15 mg (15 mg Intravenous Given 03/29/22 2204)  famotidine (PEPCID)  tablet 20 mg (20 mg Oral Given 03/29/22 2200)      Clinical Impression:  1. Chest pain, unspecified type      Discharge   Final Clinical Impression(s) / ED Diagnoses Final diagnoses:  Chest pain, unspecified type    Rx / DC Orders ED Discharge Orders     None         Tretha Sciara, MD 03/29/22 2254

## 2022-03-29 NOTE — ED Triage Notes (Signed)
Pt states chest pain that started today around 6pm. Starts in the middle of his chest on moves to the left chest. No N/V associated with the pain.

## 2022-03-29 NOTE — ED Notes (Signed)
Discharge paperwork reviewed entirely with patient, including Rx's and follow up care. Pain was under control. Pt verbalized understanding as well as all parties involved. No questions or concerns voiced at the time of discharge. No acute distress noted.   Pt ambulated out to PVA without incident or assistance.  

## 2022-03-30 ENCOUNTER — Other Ambulatory Visit (HOSPITAL_BASED_OUTPATIENT_CLINIC_OR_DEPARTMENT_OTHER): Payer: Self-pay

## 2022-04-03 ENCOUNTER — Other Ambulatory Visit (HOSPITAL_BASED_OUTPATIENT_CLINIC_OR_DEPARTMENT_OTHER): Payer: Self-pay

## 2022-04-03 DIAGNOSIS — J209 Acute bronchitis, unspecified: Secondary | ICD-10-CM | POA: Diagnosis not present

## 2022-04-03 DIAGNOSIS — R053 Chronic cough: Secondary | ICD-10-CM | POA: Diagnosis not present

## 2022-04-05 ENCOUNTER — Other Ambulatory Visit (HOSPITAL_BASED_OUTPATIENT_CLINIC_OR_DEPARTMENT_OTHER): Payer: Self-pay

## 2022-04-06 ENCOUNTER — Other Ambulatory Visit (HOSPITAL_BASED_OUTPATIENT_CLINIC_OR_DEPARTMENT_OTHER): Payer: Self-pay

## 2022-04-12 ENCOUNTER — Other Ambulatory Visit (HOSPITAL_BASED_OUTPATIENT_CLINIC_OR_DEPARTMENT_OTHER): Payer: Self-pay

## 2022-04-13 ENCOUNTER — Other Ambulatory Visit (HOSPITAL_BASED_OUTPATIENT_CLINIC_OR_DEPARTMENT_OTHER): Payer: Self-pay

## 2022-04-13 MED ORDER — TAMSULOSIN HCL 0.4 MG PO CAPS
0.4000 mg | ORAL_CAPSULE | Freq: Every day | ORAL | 0 refills | Status: DC
  Filled 2022-04-13: qty 90, 90d supply, fill #0

## 2022-04-14 ENCOUNTER — Other Ambulatory Visit (HOSPITAL_BASED_OUTPATIENT_CLINIC_OR_DEPARTMENT_OTHER): Payer: Self-pay

## 2022-04-17 ENCOUNTER — Other Ambulatory Visit (HOSPITAL_BASED_OUTPATIENT_CLINIC_OR_DEPARTMENT_OTHER): Payer: Self-pay

## 2022-04-18 ENCOUNTER — Other Ambulatory Visit (HOSPITAL_BASED_OUTPATIENT_CLINIC_OR_DEPARTMENT_OTHER): Payer: Self-pay

## 2022-04-20 ENCOUNTER — Other Ambulatory Visit (HOSPITAL_BASED_OUTPATIENT_CLINIC_OR_DEPARTMENT_OTHER): Payer: Self-pay

## 2022-04-24 ENCOUNTER — Other Ambulatory Visit (HOSPITAL_BASED_OUTPATIENT_CLINIC_OR_DEPARTMENT_OTHER): Payer: Self-pay

## 2022-04-24 DIAGNOSIS — H5711 Ocular pain, right eye: Secondary | ICD-10-CM | POA: Diagnosis not present

## 2022-04-24 MED ORDER — PREDNISOLONE ACETATE 1 % OP SUSP
1.0000 [drp] | Freq: Three times a day (TID) | OPHTHALMIC | 0 refills | Status: DC
  Filled 2022-04-24: qty 5, 21d supply, fill #0

## 2022-04-25 ENCOUNTER — Other Ambulatory Visit (HOSPITAL_BASED_OUTPATIENT_CLINIC_OR_DEPARTMENT_OTHER): Payer: Self-pay

## 2022-04-26 ENCOUNTER — Other Ambulatory Visit (HOSPITAL_BASED_OUTPATIENT_CLINIC_OR_DEPARTMENT_OTHER): Payer: Self-pay

## 2022-04-26 ENCOUNTER — Other Ambulatory Visit: Payer: Self-pay

## 2022-04-26 MED ORDER — PREGABALIN 50 MG PO CAPS
50.0000 mg | ORAL_CAPSULE | Freq: Three times a day (TID) | ORAL | 3 refills | Status: DC
  Filled 2022-04-26: qty 193, 65d supply, fill #0
  Filled 2022-04-26: qty 77, 25d supply, fill #0

## 2022-04-27 ENCOUNTER — Other Ambulatory Visit (HOSPITAL_BASED_OUTPATIENT_CLINIC_OR_DEPARTMENT_OTHER): Payer: Self-pay

## 2022-04-27 MED ORDER — VIBEGRON 75 MG PO TABS
75.0000 mg | ORAL_TABLET | Freq: Every day | ORAL | 5 refills | Status: DC
  Filled 2022-04-27: qty 30, 30d supply, fill #0

## 2022-05-01 ENCOUNTER — Telehealth (HOSPITAL_BASED_OUTPATIENT_CLINIC_OR_DEPARTMENT_OTHER): Payer: Self-pay | Admitting: Cardiovascular Disease

## 2022-05-01 ENCOUNTER — Other Ambulatory Visit (HOSPITAL_BASED_OUTPATIENT_CLINIC_OR_DEPARTMENT_OTHER): Payer: Self-pay

## 2022-05-01 DIAGNOSIS — R079 Chest pain, unspecified: Secondary | ICD-10-CM | POA: Diagnosis not present

## 2022-05-01 NOTE — Telephone Encounter (Signed)
Returned call to patient, he states that he has been having pain in the left side of his chest that radiates down. He states it has happened before and he has been seen in the ED for it. Offered patient first available with Laurann Montana, NP. Patient states he would just like to have his information reviewed by Dr. Oval Linsey. Reviewed Ed precautions reviewed and patient's wife states that they will take the appointment as long as they can see Dr. Oval Linsey going forward. Patient scheduled for April 1st with Laurann Montana, NP    "Dr. Oval Linsey out of office. Recommend schedule OV with APP, review ED precautions, and request note from PCP office.    Loel Dubonnet, NP"

## 2022-05-01 NOTE — Telephone Encounter (Signed)
Dr. Vista Lawman is calling requesting a callback from Dr. Oval Linsey to have the patient worked in for a sooner appointment due to CP. Please advise.

## 2022-05-01 NOTE — Telephone Encounter (Signed)
Dr. Oval Linsey out of office. Recommend schedule OV with APP, review ED precautions, and request note from PCP office.   Loel Dubonnet, NP

## 2022-05-07 NOTE — Progress Notes (Unsigned)
Cardiology Office Note:    Date:  05/08/2022   ID:  Kenneth Melendez, DOB 22-Dec-1955, MRN [id number]  PCP:  Benito Mccreedy, MD   [place] Providers Cardiologist:  Sinclair Grooms, MD (Inactive)     Referring MD: Benito Mccreedy, MD   CC: follow up for ED visit   History of Present Illness:    Kenneth Melendez is a 67 y.o. male with a hx of atrial flutter, hypertension, migraine, HLD, bradycardia.  [facility]ially established with [facility] 2019 after a syncopal episode that was felt to be d/t recent titration of BP medications and working outside during a particularly hot day.  He wore a Holter monitor which revealed normal sinus rhythm, with rare PACs/PVCs.  In September 2022 he presented to the ED with symptoms of dizziness and noted an elevated heart rate on his Fitbit.  EKG at that time showed rapid atrial flutter, he underwent DCCV.  He was started on Eliquis for a CHA2DS2-VASc score of 2.  Echocardiogram around this time showed an EF of 55 to 60%, trivial MR, mild calcification of the aortic valve, mild aortic valve sclerosis with no evidence of stenosis.  Summer 2022 he was noted to be back in atrial flutter again, the rate DCCV for second time.  On 02/09/21 he underwent successful EPS/RFCA with residual sinus node dysfunction but not severe enough for PPM.  Most recently he was evaluated by Dr. Lovena Le on 03/07/2021 s/p ablation per above, at that time he was doing well from a cardiac perspective without known recurrence of AF.   On 03/29/2022 he presented to the ED with intermittent chest pain over the previous 24 hours.  EKG was normal, troponin was negative, chest x-ray was unrevealing, he was ultimately discharged home.  He presents today accompanied by his wife for follow-up after recent ED visit as outlined above.  He has not had any recurrence of symptoms, however his wife states that he does have fatigue at times cannot be explained. Chest pain that sent him to  the ED was not precipitated by anything that he can recall, but was intermittent and sore. He denies chest pain, palpitations, dyspnea, pnd, orthopnea, n, v, dizziness, syncope, edema, weight gain, or early satiety.   Past Medical History:  Diagnosis Date   Erectile dysfunction    Hemorrhoids    Hyperlipidemia    Hypertension    Migraine    followed by [facility]   Pre-diabetes    Urethral stricture     Past Surgical History:  Procedure Laterality Date   A-FLUTTER ABLATION N/A 02/08/2021   Procedure: A-FLUTTER ABLATION;  Surgeon: Evans Lance, MD;  Location: [facility];  Service: Cardiovascular;  Laterality: N/A;   APPENDECTOMY  1970's   BUNIONECTOMY Right 2005  approx   COLONOSCOPY  04/20/2009   CYSTOSCOPY WITH URETHRAL DILATATION N/A 01/17/2016   Procedure: CYSTOSCOPY WITH URETHRAL  DILATATION;  Surgeon: Cleon Gustin, MD;  Location: [facility];  Service: Urology;  Laterality: N/A;   HEMORRHOIDECTOMY WITH HEMORRHOID BANDING  06/14/2010   INGUINAL HERNIA REPAIR Left 1996  approx   SHOULDER ARTHROSCOPY  01/02/2011   Procedure: ARTHROSCOPY SHOULDER;  Surgeon: Sharmon Revere;  Location: [facility];  Service: Orthopedics;  Laterality: Left;   SUPRAUMBILICAL HERNIA REPAIR W/ MESH  05/14/2009    Current Medications: Current Meds  Medication Sig   Ascorbic Acid (VITAMIN C) 1000 MG tablet Take 1,000 mg by mouth in the morning.  atorvastatin (LIPITOR) 10 MG tablet Take 1 tablet (10 mg total) by mouth daily.   BIOTIN PO Take 1 tablet by mouth in the morning.   Cinnamon 500 MG capsule Take 500 mg by mouth in the morning.   ergocalciferol (VITAMIN D2) 1.25 MG (50000 UT) capsule Take 1 capsule (50,000 Units total) by mouth once a week.   GEMTESA 75 MG TABS Take 75 mg by mouth in the morning.   losartan (COZAAR) 50 MG tablet Take 2 tablets by mouth once daily   Multiple Vitamin (MULTIVITAMIN WITH MINERALS) TABS tablet Take 1 tablet by mouth in the  morning.   ondansetron (ZOFRAN-ODT) 4 MG disintegrating tablet Take 1 tablet (4 mg total) by mouth 3 (three) times daily as needed.   pregabalin (LYRICA) 50 MG capsule Take 1 capsule (50 mg total) by mouth 3 (three) times daily.   Rimegepant Sulfate (NURTEC) 75 MG TBDP Take 75 mg by mouth daily as needed. Max 1 in 24 hours   sildenafil (VIAGRA) 100 MG tablet Take 1 tablet by mouth as needed for erectile dysfunction.   tamsulosin (FLOMAX) 0.4 MG CAPS capsule Take 1 capsule (0.4 mg total) by mouth daily.   tiZANidine (ZANAFLEX) 4 MG tablet Take 1 tablet (4 mg total) by mouth every 6 (six) hours as needed for muscle pain and spasms   Turmeric 500 MG CAPS Take 500 mg by mouth in the morning.   Ubrogepant (UBRELVY) 100 MG TABS Take 100 mg by mouth as needed. May repeat in 2 hours (Max 2/24hours).     Allergies:   Patient has no known allergies.   Social History   Socioeconomic History   Marital status: Married    Spouse name: Not on file   Number of children: Not on file   Years of education: Not on file   Highest education level: Not on file  Occupational History   Occupation: Retired   Tobacco Use   Smoking status: Never   Smokeless tobacco: Never  Vaping Use   Vaping Use: Never used  Substance and Sexual Activity   Alcohol use: Not Currently    Comment: social    Drug use: No   Sexual activity: Not Currently  Other Topics Concern   Not on file  Social History Narrative   Not on file   Social Determinants of Health   Financial Resource Strain: Not on file  Food Insecurity: Not on file  Transportation Needs: Not on file  Physical Activity: Not on file  Stress: Not on file  Social Connections: Not on file     Family History: The patient's family history includes Diabetes in his mother; Heart attack in his father and mother; Heart disease in his mother; Hypotension in his mother. There is no history of Anesthesia problems, Malignant hyperthermia, or Pseudochol  deficiency.  ROS:   Please see the history of present illness.    All other systems reviewed and are negative.  EKGs/Labs/Other Studies Reviewed:    The following studies were reviewed today:   EKG:  EKG is  ordered today.  The ekg ordered today demonstrates SB, HR 42 bpm.   Recent Labs: 03/29/2022: BUN 21; Creatinine, Ser 1.18; Hemoglobin 14.5; Platelets 251; Potassium 4.1; Sodium 136  Recent Lipid Panel No results found for: "CHOL", "TRIG", "HDL", "CHOLHDL", "VLDL", "LDLCALC", "LDLDIRECT"   Risk Assessment/Calculations:    CHA2DS2-VASc Score = 2   This indicates a 2.2% annual risk of stroke. The patient's score is based upon: CHF History: 0  HTN History: 1 Diabetes History: 0 Stroke History: 0 Vascular Disease History: 0 Age Score: 1 Gender Score: 0               Physical Exam:    VS:  BP 130/78   Pulse (!) 42   Ht 6\' 3"  (1.905 m)   Wt 251 lb (113.9 kg)   BMI 31.37 kg/m     Wt Readings from Last 3 Encounters:  05/08/22 251 lb (113.9 kg)  03/29/22 250 lb (113.4 kg)  04/08/21 240 lb 1.3 oz (108.9 kg)     GEN:  Well nourished, well developed in no acute distress HEENT: Normal NECK: No JVD; No carotid bruits LYMPHATICS: No lymphadenopathy CARDIAC: regular but slow,  no murmurs, rubs, gallops RESPIRATORY:  Clear to auscultation without rales, wheezing or rhonchi  ABDOMEN: Soft, non-tender, non-distended MUSCULOSKELETAL:  No edema; No deformity  SKIN: Warm and dry NEUROLOGIC:  Alert and oriented x 3 PSYCHIATRIC:  Normal affect   ASSESSMENT:    1. Chest pain of uncertain etiology   2. Essential hypertension   3. Atrial flutter, unspecified type   4. Hyperlipidemia LDL goal <70   5. Chest pain on breathing    PLAN:    In order of problems listed above:  Chest pain - Recent visit to the ED as outlined above, with negative troponin x 2, EKG today no acute ST-T wave changes. He had another episode prior to ED visit. Pain has mixed features, and pt has  co-morbid conditions. Currently, he is without chest pain. Will arrange cCTA.  Hypertension - BP today is 130/78, continue current antihypertensive medication regimen.  Atrial flutter/SB - s/p EPS/RFCA, DOAC d/c'd by EP.  Today, he is in sinus bradycardia. He has been bradycardic for many years. Could consider a monitor to r/o significant pauses or heartblock in the future, presently asymptomatic without dizziness noted.  HLD - LDL 93 in 2021, managed by PCP, if coronary calcification noted will plan to intensify statin therapy.   Disposition - cCTA for evaluation of CAD; return as needed after results. Keep f/u with Dr. Oval Linsey in September, will try to move appt with her up sooner if she has cancellation.            Medication Adjustments/Labs and Tests Ordered: Current medicines are reviewed at length with the patient today.  Concerns regarding medicines are outlined above.  Orders Placed This Encounter  Procedures   CT CORONARY MORPH W/CTA COR W/SCORE W/CA W/CM &/OR WO/CM   Basic metabolic panel   EKG [id number]   No orders of the defined types were placed in this encounter.   Patient Instructions  Medication Instructions:  Your physician recommends that you continue on your current medications as directed. Please refer to the Current Medication list given to you today.  *If you need a refill on your cardiac medications before your next appointment, please call your pharmacy*   Lab Work: Your physician recommends that you return for lab work today- BMP  If you have labs (blood work) drawn today and your tests are completely normal, you will receive your results only by: MyChart Message (if you have MyChart) OR A paper copy in the mail If you have any lab test that is abnormal or we need to change your treatment, we will call you to review the results.   Testing/Procedures:   Your cardiac CT will be scheduled at one of the below locations:   [facility] [address]  [place], [place] [address] [phone]  If scheduled at [facility], please arrive at the [facility] and Children's Entrance (Entrance C2) of [facility] 30 minutes prior to test start time. You can use the FREE valet parking offered at entrance C (encouraged to control the heart rate for the test)  Proceed to the [facility] Radiology Department (first floor) to check-in and test prep.  All radiology patients and guests should use entrance C2 at [facility], accessed from [facility], even though the hospital's physical address listed is [address].     Please follow these instructions carefully (unless otherwise directed):  Hold all erectile dysfunction medications at least 3 days (72 hrs) prior to test. (Ie viagra, cialis, sildenafil, tadalafil, etc) We will administer nitroglycerin during this exam.   On the Night Before the Test: Be sure to Drink plenty of water. Do not consume any caffeinated/decaffeinated beverages or chocolate 12 hours prior to your test. Do not take any antihistamines 12 hours prior to your test.  On the Day of the Test: Drink plenty of water until 1 hour prior to the test. Do not eat any food 1 hour prior to test. You may take your regular medications prior to the test.       After the Test: Drink plenty of water. After receiving IV contrast, you may experience a mild flushed feeling. This is normal. On occasion, you may experience a mild rash up to 24 hours after the test. This is not dangerous. If this occurs, you can take Benadryl 25 mg and increase your fluid intake. If you experience trouble breathing, this can be serious. If it is severe call 911 IMMEDIATELY. If it is mild, please call our office. If you take any of these medications: Glipizide/Metformin, Avandament, Glucavance, please do not take 48 hours after completing test unless otherwise instructed.  We will call to schedule your  test 2-4 weeks out understanding that some insurance companies will need an authorization prior to the service being performed.   For non-scheduling related questions, please contact the cardiac imaging nurse navigator should you have any questions/concerns: Marchia Bond, Cardiac Imaging Nurse Navigator Gordy Clement, Cardiac Imaging Nurse Navigator [facility] Direct Office Dial: [phone]   For scheduling needs, including cancellations and rescheduling, please call [place], [phone].    Follow-Up: At [facility], you and your health needs are our priority.  As part of our continuing mission to provide you with exceptional heart care, we have created designated Provider Care Teams.  These Care Teams include your primary Cardiologist (physician) and Advanced Practice Providers (APPs -  Physician Assistants and Nurse Practitioners) who all work together to provide you with the care you need, when you need it.  We recommend signing up for the patient portal called "MyChart".  Sign up information is provided on this After Visit Summary.  MyChart is used to connect with patients for Virtual Visits (Telemedicine).  Patients are able to view lab/test results, encounter notes, upcoming appointments, etc.  Non-urgent messages can be sent to your provider as well.   To learn more about what you can do with MyChart, go to [email or web address].    Your next appointment:   Follow up pending results of CTA     Signed, Trudi Ida, NP  05/08/2022 10:19 AM    [place]

## 2022-05-08 ENCOUNTER — Encounter (HOSPITAL_BASED_OUTPATIENT_CLINIC_OR_DEPARTMENT_OTHER): Payer: Self-pay | Admitting: Cardiology

## 2022-05-08 ENCOUNTER — Ambulatory Visit (INDEPENDENT_AMBULATORY_CARE_PROVIDER_SITE_OTHER): Payer: Federal, State, Local not specified - PPO | Admitting: Cardiology

## 2022-05-08 VITALS — BP 130/78 | HR 42 | Ht 75.0 in | Wt 251.0 lb

## 2022-05-08 DIAGNOSIS — R079 Chest pain, unspecified: Secondary | ICD-10-CM

## 2022-05-08 DIAGNOSIS — G43009 Migraine without aura, not intractable, without status migrainosus: Secondary | ICD-10-CM | POA: Diagnosis not present

## 2022-05-08 DIAGNOSIS — I10 Essential (primary) hypertension: Secondary | ICD-10-CM

## 2022-05-08 DIAGNOSIS — I4892 Unspecified atrial flutter: Secondary | ICD-10-CM

## 2022-05-08 DIAGNOSIS — E785 Hyperlipidemia, unspecified: Secondary | ICD-10-CM | POA: Diagnosis not present

## 2022-05-08 DIAGNOSIS — E782 Mixed hyperlipidemia: Secondary | ICD-10-CM | POA: Diagnosis not present

## 2022-05-08 DIAGNOSIS — R7303 Prediabetes: Secondary | ICD-10-CM | POA: Diagnosis not present

## 2022-05-08 DIAGNOSIS — R071 Chest pain on breathing: Secondary | ICD-10-CM

## 2022-05-08 NOTE — Patient Instructions (Signed)
Medication Instructions:  Your physician recommends that you continue on your current medications as directed. Please refer to the Current Medication list given to you today.  *If you need a refill on your cardiac medications before your next appointment, please call your pharmacy*   Lab Work: Your physician recommends that you return for lab work today- BMP  If you have labs (blood work) drawn today and your tests are completely normal, you will receive your results only by: MyChart Message (if you have MyChart) OR A paper copy in the mail If you have any lab test that is abnormal or we need to change your treatment, we will call you to review the results.   Testing/Procedures:   Your cardiac CT will be scheduled at one of the below locations:   [facility] [address] [place], [place] [address] [phone]  If scheduled at [facility], please arrive at the [facility]'s Entrance (Entrance C2) of [facility] 30 minutes prior to test start time. You can use the FREE valet parking offered at entrance C (encouraged to control the heart rate for the test)  Proceed to the [facility] Radiology Department (first floor) to check-in and test prep.  All radiology patients and guests should use entrance C2 at [facility], accessed from [facility], even though the hospital's physical address listed is [address]     Please follow these instructions carefully (unless otherwise directed):  Hold all erectile dysfunction medications at least 3 days (72 hrs) prior to test. (Ie viagra, cialis, sildenafil, tadalafil, etc) We will administer nitroglycerin during this exam.   On the Night Before the Test: Be sure to Drink plenty of water. Do not consume any caffeinated/decaffeinated beverages or chocolate 12 hours prior to your test. Do not take any antihistamines 12 hours prior to your test.  On the Day of the  Test: Drink plenty of water until 1 hour prior to the test. Do not eat any food 1 hour prior to test. You may take your regular medications prior to the test.       After the Test: Drink plenty of water. After receiving IV contrast, you may experience a mild flushed feeling. This is normal. On occasion, you may experience a mild rash up to 24 hours after the test. This is not dangerous. If this occurs, you can take Benadryl 25 mg and increase your fluid intake. If you experience trouble breathing, this can be serious. If it is severe call 911 IMMEDIATELY. If it is mild, please call our office. If you take any of these medications: Glipizide/Metformin, Avandament, Glucavance, please do not take 48 hours after completing test unless otherwise instructed.  We will call to schedule your test 2-4 weeks out understanding that some insurance companies will need an authorization prior to the service being performed.   For non-scheduling related questions, please contact the cardiac imaging nurse navigator should you have any questions/concerns: Marchia Bond, Cardiac Imaging Nurse Navigator Gordy Clement, Cardiac Imaging Nurse Navigator [facility] Direct Office Dial: [phone]   For scheduling needs, including cancellations and rescheduling, please call [place], [phone].    Follow-Up: At [facility], you and your health needs are our priority.  As part of our continuing mission to provide you with exceptional heart care, we have created designated Provider Care Teams.  These Care Teams include your primary Cardiologist (physician) and Advanced Practice Providers (APPs -  Physician Assistants and Nurse Practitioners) who all work together to provide you with the care you need, when you need it.  We recommend signing up for the patient portal called "MyChart".  Sign up information is provided on this After Visit Summary.  MyChart is used to connect with  patients for Virtual Visits (Telemedicine).  Patients are able to view lab/test results, encounter notes, upcoming appointments, etc.  Non-urgent messages can be sent to your provider as well.   To learn more about what you can do with MyChart, go to [email or web address].    Your next appointment:   Follow up pending results of CTA

## 2022-05-08 NOTE — Telephone Encounter (Signed)
Pt seen this morning by Venia Carbon, NP. Office note completed and faxed to requesting provider. FYI if you want to call him back.  Loel Dubonnet, NP

## 2022-05-08 NOTE — Telephone Encounter (Signed)
Provider would like a callback regarding pt. Please advise

## 2022-05-09 LAB — BASIC METABOLIC PANEL WITH GFR
BUN/Creatinine Ratio: 23 (ref 10–24)
BUN: 21 mg/dL (ref 8–27)
CO2: 22 mmol/L (ref 20–29)
Calcium: 9.2 mg/dL (ref 8.6–10.2)
Chloride: 106 mmol/L (ref 96–106)
Creatinine, Ser: 0.91 mg/dL (ref 0.76–1.27)
Glucose: 96 mg/dL (ref 70–99)
Potassium: 4.4 mmol/L (ref 3.5–5.2)
Sodium: 142 mmol/L (ref 134–144)
eGFR: 92 mL/min/1.73

## 2022-05-10 ENCOUNTER — Telehealth (HOSPITAL_COMMUNITY): Payer: Self-pay | Admitting: Emergency Medicine

## 2022-05-10 DIAGNOSIS — H5213 Myopia, bilateral: Secondary | ICD-10-CM | POA: Diagnosis not present

## 2022-05-10 DIAGNOSIS — H353111 Nonexudative age-related macular degeneration, right eye, early dry stage: Secondary | ICD-10-CM | POA: Diagnosis not present

## 2022-05-10 DIAGNOSIS — H35413 Lattice degeneration of retina, bilateral: Secondary | ICD-10-CM | POA: Diagnosis not present

## 2022-05-10 DIAGNOSIS — H2513 Age-related nuclear cataract, bilateral: Secondary | ICD-10-CM | POA: Diagnosis not present

## 2022-05-10 DIAGNOSIS — H5711 Ocular pain, right eye: Secondary | ICD-10-CM | POA: Diagnosis not present

## 2022-05-10 DIAGNOSIS — H52223 Regular astigmatism, bilateral: Secondary | ICD-10-CM | POA: Diagnosis not present

## 2022-05-10 DIAGNOSIS — H35371 Puckering of macula, right eye: Secondary | ICD-10-CM | POA: Diagnosis not present

## 2022-05-10 DIAGNOSIS — H524 Presbyopia: Secondary | ICD-10-CM | POA: Diagnosis not present

## 2022-05-10 DIAGNOSIS — H35033 Hypertensive retinopathy, bilateral: Secondary | ICD-10-CM | POA: Diagnosis not present

## 2022-05-10 NOTE — Telephone Encounter (Signed)
Reaching out to patient to offer assistance regarding upcoming cardiac imaging study; pt verbalizes understanding of appt date/time, parking situation and where to check in, pre-test NPO status and medications ordered, and verified current allergies; name and call back number provided for further questions should they arise Labrandon Knoch RN Navigator Cardiac Imaging [facility] [phone] office [phone] cell 

## 2022-05-15 ENCOUNTER — Telehealth: Payer: Self-pay | Admitting: Cardiology

## 2022-05-15 ENCOUNTER — Ambulatory Visit (HOSPITAL_COMMUNITY)
Admission: RE | Admit: 2022-05-15 | Discharge: 2022-05-15 | Disposition: A | Discharge status: Home / Self Care | Payer: Medicare Other | Source: Ambulatory Visit | Attending: Cardiology | Admitting: Cardiology

## 2022-05-15 ENCOUNTER — Telehealth (HOSPITAL_BASED_OUTPATIENT_CLINIC_OR_DEPARTMENT_OTHER): Payer: Self-pay

## 2022-05-15 DIAGNOSIS — R071 Chest pain on breathing: Secondary | ICD-10-CM | POA: Insufficient documentation

## 2022-05-15 DIAGNOSIS — R072 Precordial pain: Secondary | ICD-10-CM | POA: Diagnosis not present

## 2022-05-15 DIAGNOSIS — I2510 Atherosclerotic heart disease of native coronary artery without angina pectoris: Secondary | ICD-10-CM | POA: Insufficient documentation

## 2022-05-15 DIAGNOSIS — E785 Hyperlipidemia, unspecified: Secondary | ICD-10-CM

## 2022-05-15 MED ORDER — IOHEXOL 350 MG/ML SOLN
100.0000 mL | Freq: Once | INTRAVENOUS | Status: AC | PRN
  Administered 2022-05-15: 100 mL via INTRAVENOUS

## 2022-05-15 MED ORDER — NITROGLYCERIN 0.4 MG SL SUBL
0.8000 mg | SUBLINGUAL_TABLET | Freq: Once | SUBLINGUAL | Status: AC
  Administered 2022-05-15: 0.8 mg via SUBLINGUAL

## 2022-05-15 MED ORDER — NITROGLYCERIN 0.4 MG SL SUBL
SUBLINGUAL_TABLET | SUBLINGUAL | Status: AC
  Filled 2022-05-15: qty 2

## 2022-05-15 NOTE — Telephone Encounter (Signed)
Pt is returning Kaila's call regarding his CT results.

## 2022-05-15 NOTE — Telephone Encounter (Signed)
Patient returning call, transferred call from call center.   The following recommendations were reviewed with the patient and his wife. Labs ordered and mailed to patient.    Mr. Irias, Your coronary CTA was reassuring. You did have some minimal non-obstructive coronary artery disease in one of your smaller heart vessels, but this would not have caused your chest pain. It would be reasonable to start a baby ASA daily. Only other recommendations would be to have your cholesterol well managed and have your LDL at 70 or below, lets get an updated lipid panel and liver function test to see where your LDL currently is. Just let us know a date/time that work for you. Best, Victorino Dike

## 2022-05-15 NOTE — Addendum Note (Signed)
Addended by: Marlene Lard on: 05/15/2022 03:36 PM   Modules accepted: Orders

## 2022-05-15 NOTE — Telephone Encounter (Addendum)
Left message for patient to call back     ----- Message from Flossie Dibble, NP sent at 05/15/2022 10:30 AM EDT ----- Kenneth Melendez,  Your coronary CTA was reassuring. You did have some minimal non-obstructive coronary artery disease in one of your smaller heart vessels, but this would not have caused your chest pain. It would be reasonable to start a baby ASA daily. Only other recommendations would be to have your cholesterol well managed and have your LDL at 70 or below, lets get an updated lipid panel and liver function test to see where your LDL currently is. Just let us know a date/time that work for you. Best, Victorino Dike

## 2022-05-15 NOTE — Telephone Encounter (Signed)
Pt calling back again, informed him RN will c/b. She states he will be in a meeting from 1-2 and ask that you call after that time.

## 2022-05-15 NOTE — Telephone Encounter (Signed)
Left message for patient to call back  Mr. Garron, Your coronary CTA was reassuring. You did have some minimal non-obstructive coronary artery disease in one of your smaller heart vessels, but this would not have caused your chest pain. It would be reasonable to start a baby ASA daily. Only other recommendations would be to have your cholesterol well managed and have your LDL at 70 or below, lets get an updated lipid panel and liver function test to see where your LDL currently is. Just let us know a date/time that work for you. Best, Victorino Dike

## 2022-05-22 DIAGNOSIS — M47816 Spondylosis without myelopathy or radiculopathy, lumbar region: Secondary | ICD-10-CM | POA: Diagnosis not present

## 2022-05-22 DIAGNOSIS — M5136 Other intervertebral disc degeneration, lumbar region: Secondary | ICD-10-CM | POA: Diagnosis not present

## 2022-05-27 LAB — HEPATIC FUNCTION PANEL
ALT: 24 IU/L (ref 0–44)
AST: 24 IU/L (ref 0–40)
Albumin: 4.2 g/dL (ref 3.9–4.9)
Alkaline Phosphatase: 42 IU/L — ABNORMAL LOW (ref 44–121)
Bilirubin Total: 0.5 mg/dL (ref 0.0–1.2)
Bilirubin, Direct: 0.16 mg/dL (ref 0.00–0.40)
Total Protein: 6.6 g/dL (ref 6.0–8.5)

## 2022-05-27 LAB — LIPID PANEL
Chol/HDL Ratio: 2.9 ratio (ref 0.0–5.0)
Cholesterol, Total: 135 mg/dL (ref 100–199)
HDL: 46 mg/dL (ref >39)
LDL Chol Calc (NIH): 78 mg/dL (ref 0–99)
Triglycerides: 51 mg/dL (ref 0–149)
VLDL Cholesterol Cal: 11 mg/dL (ref 5–40)

## 2022-05-29 ENCOUNTER — Telehealth (HOSPITAL_BASED_OUTPATIENT_CLINIC_OR_DEPARTMENT_OTHER): Payer: Self-pay

## 2022-05-29 ENCOUNTER — Other Ambulatory Visit (HOSPITAL_BASED_OUTPATIENT_CLINIC_OR_DEPARTMENT_OTHER): Payer: Self-pay

## 2022-05-29 DIAGNOSIS — E785 Hyperlipidemia, unspecified: Secondary | ICD-10-CM

## 2022-05-29 MED ORDER — ATORVASTATIN CALCIUM 20 MG PO TABS
20.0000 mg | ORAL_TABLET | Freq: Every day | ORAL | 3 refills | Status: DC
  Filled 2022-05-29: qty 90, 90d supply, fill #0
  Filled 2022-09-19: qty 90, 90d supply, fill #1
  Filled 2022-12-23: qty 90, 90d supply, fill #2
  Filled 2023-03-29: qty 90, 90d supply, fill #3

## 2022-05-29 NOTE — Telephone Encounter (Addendum)
Called patient, reviewed the following results, Rx sent to pharmacy, labs mailed to patient. Confirmed patient that he is still on the waitlist for a sooner appointment with Dr. Duke Salvia.    ----- Message from Alver Sorrow, NP sent at 05/29/2022  3:56 PM EDT ----- Normal liver enzymes.  LDL (bad cholesterol) of 78 which is not at goal of less than 70.  Increase atorvastatin to 20 mg daily withFLP/LFT in 8 weeks

## 2022-06-01 ENCOUNTER — Other Ambulatory Visit (HOSPITAL_BASED_OUTPATIENT_CLINIC_OR_DEPARTMENT_OTHER): Payer: Self-pay

## 2022-06-01 MED ORDER — UBRELVY 100 MG PO TABS
100.0000 mg | ORAL_TABLET | Freq: Every day | ORAL | 5 refills | Status: DC | PRN
  Filled 2022-06-01: qty 16, 30d supply, fill #0
  Filled 2022-07-17: qty 16, 30d supply, fill #1

## 2022-06-05 ENCOUNTER — Other Ambulatory Visit (HOSPITAL_BASED_OUTPATIENT_CLINIC_OR_DEPARTMENT_OTHER): Payer: Self-pay

## 2022-06-06 ENCOUNTER — Other Ambulatory Visit (HOSPITAL_BASED_OUTPATIENT_CLINIC_OR_DEPARTMENT_OTHER): Payer: Self-pay

## 2022-06-06 ENCOUNTER — Other Ambulatory Visit: Payer: Self-pay

## 2022-06-06 MED ORDER — GEMTESA 75 MG PO TABS
75.0000 mg | ORAL_TABLET | Freq: Every day | ORAL | 3 refills | Status: DC
  Filled 2022-06-06 – 2022-07-17 (×2): qty 30, 30d supply, fill #0

## 2022-06-07 ENCOUNTER — Other Ambulatory Visit (HOSPITAL_BASED_OUTPATIENT_CLINIC_OR_DEPARTMENT_OTHER): Payer: Self-pay

## 2022-06-08 ENCOUNTER — Other Ambulatory Visit (HOSPITAL_BASED_OUTPATIENT_CLINIC_OR_DEPARTMENT_OTHER): Payer: Self-pay

## 2022-06-16 ENCOUNTER — Other Ambulatory Visit (HOSPITAL_BASED_OUTPATIENT_CLINIC_OR_DEPARTMENT_OTHER): Payer: Self-pay

## 2022-06-20 ENCOUNTER — Other Ambulatory Visit (HOSPITAL_BASED_OUTPATIENT_CLINIC_OR_DEPARTMENT_OTHER): Payer: Self-pay

## 2022-07-10 ENCOUNTER — Other Ambulatory Visit (HOSPITAL_BASED_OUTPATIENT_CLINIC_OR_DEPARTMENT_OTHER): Payer: Self-pay

## 2022-07-10 MED ORDER — TAMSULOSIN HCL 0.4 MG PO CAPS
0.4000 mg | ORAL_CAPSULE | Freq: Every day | ORAL | 3 refills | Status: DC
  Filled 2022-07-10: qty 90, 90d supply, fill #0
  Filled 2022-10-09: qty 90, 90d supply, fill #1
  Filled 2023-01-15: qty 90, 90d supply, fill #2
  Filled 2023-04-02: qty 90, 90d supply, fill #3

## 2022-07-17 ENCOUNTER — Other Ambulatory Visit (HOSPITAL_BASED_OUTPATIENT_CLINIC_OR_DEPARTMENT_OTHER): Payer: Self-pay

## 2022-07-18 ENCOUNTER — Other Ambulatory Visit (HOSPITAL_BASED_OUTPATIENT_CLINIC_OR_DEPARTMENT_OTHER): Payer: Self-pay

## 2022-07-19 ENCOUNTER — Other Ambulatory Visit (HOSPITAL_BASED_OUTPATIENT_CLINIC_OR_DEPARTMENT_OTHER): Payer: Self-pay

## 2022-07-20 ENCOUNTER — Other Ambulatory Visit (HOSPITAL_BASED_OUTPATIENT_CLINIC_OR_DEPARTMENT_OTHER): Payer: Self-pay

## 2022-07-21 ENCOUNTER — Other Ambulatory Visit (HOSPITAL_BASED_OUTPATIENT_CLINIC_OR_DEPARTMENT_OTHER): Payer: Self-pay

## 2022-07-24 ENCOUNTER — Other Ambulatory Visit (HOSPITAL_BASED_OUTPATIENT_CLINIC_OR_DEPARTMENT_OTHER): Payer: Self-pay

## 2022-07-24 MED ORDER — QULIPTA 60 MG PO TABS
60.0000 mg | ORAL_TABLET | Freq: Every day | ORAL | 5 refills | Status: DC
  Filled 2022-07-24: qty 30, 30d supply, fill #0

## 2022-07-25 ENCOUNTER — Other Ambulatory Visit (HOSPITAL_BASED_OUTPATIENT_CLINIC_OR_DEPARTMENT_OTHER): Payer: Self-pay

## 2022-07-26 ENCOUNTER — Other Ambulatory Visit (HOSPITAL_BASED_OUTPATIENT_CLINIC_OR_DEPARTMENT_OTHER): Payer: Self-pay

## 2022-07-28 ENCOUNTER — Other Ambulatory Visit (HOSPITAL_BASED_OUTPATIENT_CLINIC_OR_DEPARTMENT_OTHER): Payer: Self-pay

## 2022-07-31 ENCOUNTER — Other Ambulatory Visit (HOSPITAL_BASED_OUTPATIENT_CLINIC_OR_DEPARTMENT_OTHER): Payer: Self-pay

## 2022-08-01 ENCOUNTER — Other Ambulatory Visit (HOSPITAL_BASED_OUTPATIENT_CLINIC_OR_DEPARTMENT_OTHER): Payer: Self-pay

## 2022-08-04 ENCOUNTER — Ambulatory Visit (HOSPITAL_BASED_OUTPATIENT_CLINIC_OR_DEPARTMENT_OTHER): Payer: Federal, State, Local not specified - PPO | Admitting: Cardiovascular Disease

## 2022-08-04 ENCOUNTER — Encounter (HOSPITAL_BASED_OUTPATIENT_CLINIC_OR_DEPARTMENT_OTHER): Payer: Self-pay | Admitting: Cardiovascular Disease

## 2022-08-04 VITALS — BP 130/70 | HR 47 | Ht 75.0 in | Wt 228.9 lb

## 2022-08-04 DIAGNOSIS — Z5181 Encounter for therapeutic drug level monitoring: Secondary | ICD-10-CM

## 2022-08-04 DIAGNOSIS — I483 Typical atrial flutter: Secondary | ICD-10-CM | POA: Diagnosis not present

## 2022-08-04 DIAGNOSIS — I10 Essential (primary) hypertension: Secondary | ICD-10-CM | POA: Diagnosis not present

## 2022-08-04 DIAGNOSIS — I2510 Atherosclerotic heart disease of native coronary artery without angina pectoris: Secondary | ICD-10-CM

## 2022-08-04 DIAGNOSIS — E785 Hyperlipidemia, unspecified: Secondary | ICD-10-CM

## 2022-08-04 DIAGNOSIS — R001 Bradycardia, unspecified: Secondary | ICD-10-CM

## 2022-08-04 HISTORY — DX: Atherosclerotic heart disease of native coronary artery without angina pectoris: I25.10

## 2022-08-04 HISTORY — DX: Bradycardia, unspecified: R00.1

## 2022-08-04 NOTE — Patient Instructions (Signed)
Medication Instructions:  Your physician recommends that you continue on your current medications as directed. Please refer to the Current Medication list given to you today.   *If you need a refill on your cardiac medications before your next appointment, please call your pharmacy*  Lab Work: FASTING LP/CMET SOON   If you have labs (blood work) drawn today and your tests are completely normal, you will receive your results only by: MyChart Message (if you have MyChart) OR A paper copy in the mail If you have any lab test that is abnormal or we need to change your treatment, we will call you to review the results.  Testing/Procedures: NONE  Follow-Up: At [facility], you and your health needs are our priority.  As part of our continuing mission to provide you with exceptional heart care, we have created designated Provider Care Teams.  These Care Teams include your primary Cardiologist (physician) and Advanced Practice Providers (APPs -  Physician Assistants and Nurse Practitioners) who all work together to provide you with the care you need, when you need it.  We recommend signing up for the patient portal called "MyChart".  Sign up information is provided on this After Visit Summary.  MyChart is used to connect with patients for Virtual Visits (Telemedicine).  Patients are able to view lab/test results, encounter notes, upcoming appointments, etc.  Non-urgent messages can be sent to your provider as well.   To learn more about what you can do with MyChart, go to [email or web address].    Your next appointment:   12 month(s)  Provider:   Tiffany [place], MD   

## 2022-08-04 NOTE — Progress Notes (Signed)
Cardiology Office Note:  .    Date:  08/04/2022  ID:  Kenneth Melendez, DOB 11-08-1955, MRN [id number] PCP: Kenneth Plum, MD  [facility] Providers Cardiologist:  Kenneth Noe, MD (Inactive)     History of Present Illness: .    Kenneth Melendez is a 67 y.o. male with atrial flutter s/p ablation 02/2021, hypertension, hyperlipidemia, prediabetes, DDD s/p discectomy on 11/09/20, who presents for follow-up and to establish care with me. He was previously followed by Dr. Katrinka Melendez, initially seen by him 10/2017 for syncope that was felt to likely be related to dehydration/orthostasis secondary to changes in his antihypertensive therapy (discontinuation of Diovan 160 mg/day and initiation of Benicar HCT 40/25 mg/day).   He presented to the ED 10/09/2020 with dizziness, weakness, and elevated heart rate. He was diagnosed with rapid atrial flutter and underwent DCCV. Started on Eliquis for CHADS2VASC score of 2. After wearing a 1 month monitor there were no recurring arrhythmias and anticoagulation was discontinued. He followed up with Dr. Katrinka Melendez 01/2021 and was found to be in typical atrial flutter with 2-1 AV conduction. He was asymptomatic at the time. Started on metoprolol 50 mg daily and restarted Eliquis. Losartan was discontinued due to hypotension. He had a second DCCV 01/19/2021 which was successful. He was seen by Dr. Ladona Melendez 01/2021 and underwent catheter ablation on 02/08/2021.  On 03/29/2022 he presented to the ED with intermittent chest pain for 24 hours.  EKG was normal, troponin was negative x2, chest x-ray was unrevealing.  Coronary CTA 05/2022 showed minimal plaque, and a calcium score of 11.2 which was 45th percentile.  Today, he is accompanied by a family member. Overall he is feeling pretty good. No further chest discomfort, but his heart rate has been fluctuating. He presents a log of heart rates which is personally reviewed. His lowest readings seem to be at night when he is  asleep. During the day his lows average in the 40s. He states that while just sitting and talking, his heart rate may slow to 35 bpm. Even in the heat heart rate is usually stable. He denies any issues with dizziness or lightheadedness lately. In the office today his blood pressure is 130/70 which he attributes to rushing to his appointment today. On average home readings are 125-128 systolic, some days as low as 409. Since his prior back surgery, he complains of constant numbness in his right foot. He doesn't complete a lot of heavy lifting. From time to time he has leg cramps. He now takes a magnesium supplement which seems to help. Recently he had a more severe cramp, and he may have been dehydrated. Doesn't always like to drink enough water. On occasion will drink a Pedialyte if feeling dehydrated or if he knows he will be outside working in the heat. He has passed out once before from working too much in the heat outdoors. Typically he is active during the day, has been busy lately with preparing for a family reunion. He denies snoring; his wife reports that he does snore. On some nights he will stay up late and sleep about 5-6 hours. In the mornings he usually feels pretty good. If he is tired, he is able to sit down and go to sleep anywhere. He did complete a sleep study a year ago which was reportedly negative for sleep apnea. Typically he sleeps on his stomach. He denies any palpitations, chest pain, shortness of breath, peripheral edema, headaches, orthopnea, or PND.  ROS:  Please see the history of present illness. All other systems are reviewed and negative.  (+) Numbness of right foot (+) Occasional LE muscle cramps  Studies Reviewed: .        Risk Assessment/Calculations:    CHA2DS2-VASc Score = 2   This indicates a 2.2% annual risk of stroke. The patient's score is based upon: CHF History: 0 HTN History: 1 Diabetes History: 0 Stroke History: 0 Vascular Disease History: 0 Age Score:  1 Gender Score: 0            Physical Exam:    VS:  BP 130/70 (BP Location: Left Arm, Patient Position: Sitting, Cuff Size: Large)   Pulse (!) 47   Ht 6\' 3"  (1.905 m)   Wt 228 lb 14.4 oz (103.8 kg)   BMI 28.61 kg/m  , BMI Body mass index is 28.61 kg/m. GENERAL:  Well appearing HEENT: Pupils equal round and reactive, fundi not visualized, oral mucosa unremarkable NECK:  No jugular venous distention, waveform within normal limits, carotid upstroke brisk and symmetric, no bruits, no thyromegaly LUNGS:  Clear to auscultation bilaterally HEART:  Bradycardic.  Regular rhythm.  PMI not displaced or sustained,S1 and S2 within normal limits, no S3, no S4, no clicks, no rubs, no murmurs ABD:  Flat, positive bowel sounds normal in frequency in pitch, no bruits, no rebound, no guarding, no midline pulsatile mass, no hepatomegaly, no splenomegaly EXT:  2 plus pulses throughout, no edema, no cyanosis no clubbing SKIN:  No rashes no nodules NEURO:  Cranial nerves II through XII grossly intact, motor grossly intact throughout PSYCH:  Cognitively intact, oriented to person place and time  Wt Readings from Last 3 Encounters:  08/04/22 228 lb 14.4 oz (103.8 kg)  05/08/22 251 lb (113.9 kg)  03/29/22 250 lb (113.4 kg)     ASSESSMENT AND PLAN: .    # Non-obstructive CAD:  Minimal plaque on coronary CT-A 05/2022.  Continue medical management.   # Bradycardia: Chronic low heart rate, with occasional drops to 30s-40s, mostly overnight. No associated symptoms of lightheadedness or dizziness. Daytime heart rates have been in the 40s for over a decade.  Discussed the potential need for pacemaker if symptoms develop, but currently more risk than benefit. -Monitor for symptoms of lightheadedness or dizziness. - Sleep study was negative -Avoid nodal agents  # Hypertension: Blood pressure slightly elevated at visit, but patient reports average readings of 125-128 at home. Continue losartan.  #  Hyperlipidemia: On Atorvastatin 20mg  for minimal coronary artery plaque (<25%). LDL last checked was 78, goal is under 70. -Check fasting lipid panel to ensure LDL is under 70.   Follow-up in 1 year, or sooner if any new symptoms develop.       Dispo:  FU with Ren Aspinall C. Duke Salvia, MD, [facility] in 1 year.  I,Mathew Stumpf,acting as a Neurosurgeon for Chilton Si, MD.,have documented all relevant documentation on the behalf of Chilton Si, MD,as directed by  Chilton Si, MD while in the presence of Chilton Si, MD.  I, Damier Disano C. Duke Salvia, MD have reviewed all documentation for this visit.  The documentation of the exam, diagnosis, procedures, and orders on 08/04/2022 are all accurate and complete.   Signed, Chilton Si, MD

## 2022-08-07 ENCOUNTER — Other Ambulatory Visit (HOSPITAL_BASED_OUTPATIENT_CLINIC_OR_DEPARTMENT_OTHER): Payer: Self-pay

## 2022-08-07 MED ORDER — VALACYCLOVIR HCL 1 G PO TABS
1000.0000 mg | ORAL_TABLET | Freq: Two times a day (BID) | ORAL | 0 refills | Status: AC
  Filled 2022-08-07: qty 20, 10d supply, fill #0

## 2022-08-07 MED ORDER — PREGABALIN 50 MG PO CAPS
50.0000 mg | ORAL_CAPSULE | Freq: Two times a day (BID) | ORAL | 0 refills | Status: DC
  Filled 2022-08-07: qty 180, 90d supply, fill #0

## 2022-08-09 ENCOUNTER — Other Ambulatory Visit (HOSPITAL_BASED_OUTPATIENT_CLINIC_OR_DEPARTMENT_OTHER): Payer: Self-pay

## 2022-08-09 MED ORDER — LOSARTAN POTASSIUM 25 MG PO TABS
50.0000 mg | ORAL_TABLET | Freq: Every day | ORAL | 0 refills | Status: DC
  Filled 2022-08-09: qty 180, 90d supply, fill #0

## 2022-08-09 MED ORDER — ACYCLOVIR 5 % EX OINT
1.0000 | TOPICAL_OINTMENT | CUTANEOUS | 0 refills | Status: AC
  Filled 2022-08-09: qty 10, 20d supply, fill #0

## 2022-08-16 ENCOUNTER — Other Ambulatory Visit (HOSPITAL_BASED_OUTPATIENT_CLINIC_OR_DEPARTMENT_OTHER): Payer: Self-pay

## 2022-08-16 MED ORDER — SCOPOLAMINE 1 MG/3DAYS TD PT72
1.0000 | MEDICATED_PATCH | TRANSDERMAL | 0 refills | Status: AC
  Filled 2022-08-16 (×2): qty 10, 30d supply, fill #0

## 2022-09-13 ENCOUNTER — Other Ambulatory Visit: Payer: Self-pay

## 2022-09-13 ENCOUNTER — Other Ambulatory Visit (HOSPITAL_BASED_OUTPATIENT_CLINIC_OR_DEPARTMENT_OTHER): Payer: Self-pay

## 2022-09-13 MED ORDER — TIZANIDINE HCL 4 MG PO TABS
4.0000 mg | ORAL_TABLET | Freq: Four times a day (QID) | ORAL | 2 refills | Status: DC | PRN
  Filled 2022-09-13: qty 90, 23d supply, fill #0
  Filled 2022-10-27: qty 90, 23d supply, fill #1
  Filled 2022-12-06: qty 90, 23d supply, fill #2

## 2022-10-03 ENCOUNTER — Other Ambulatory Visit (HOSPITAL_BASED_OUTPATIENT_CLINIC_OR_DEPARTMENT_OTHER): Payer: Self-pay

## 2022-10-03 MED ORDER — LOSARTAN POTASSIUM 100 MG PO TABS
200.0000 mg | ORAL_TABLET | Freq: Every day | ORAL | 2 refills | Status: DC
  Filled 2022-10-03: qty 180, 90d supply, fill #0
  Filled 2022-12-27: qty 180, 90d supply, fill #1
  Filled 2023-04-23: qty 180, 90d supply, fill #2

## 2022-10-04 IMAGING — DX DG CHEST 1V PORT
1 series · 1 of 1 positions shown · non-contrast
Comparison: Chest radiograph dated 02/05/2011

CLINICAL DATA: Shortness of breath

EXAM:
PORTABLE CHEST 1 VIEW

[chest ap]
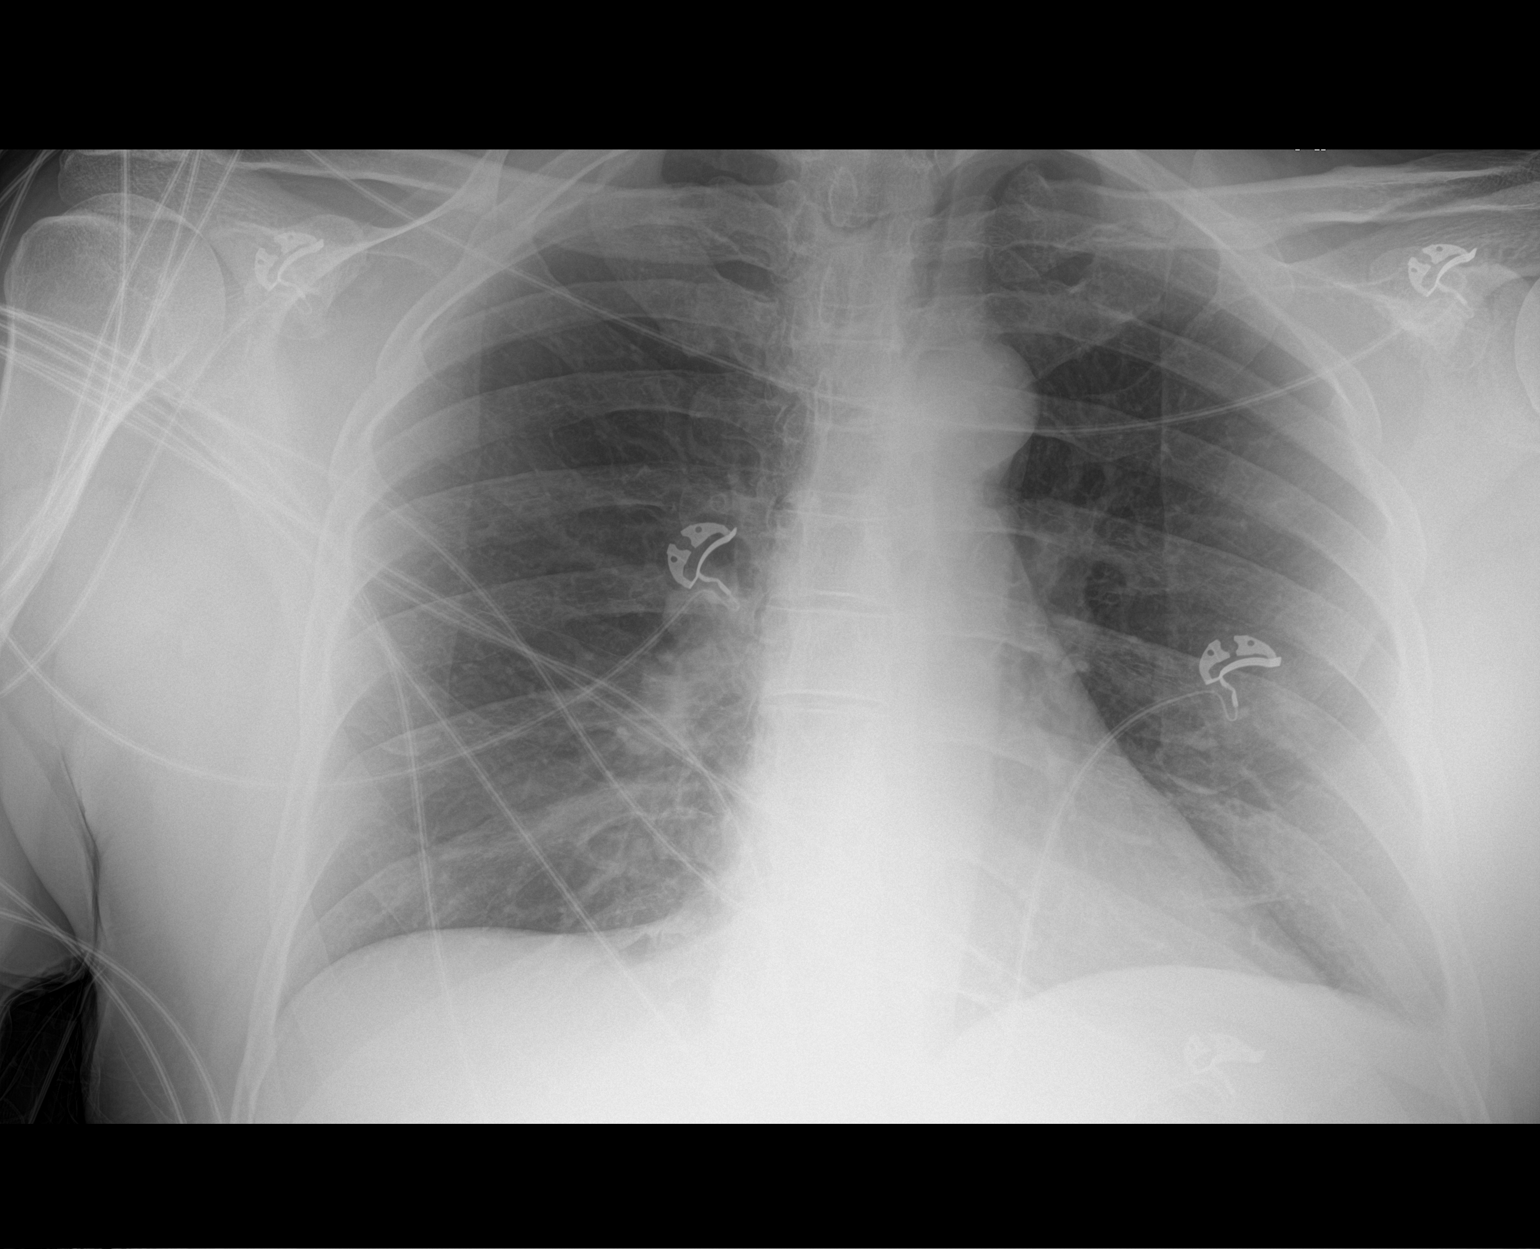

[1 of 1 positions shown; findings below may reference images not displayed]

FINDINGS: The heart size and mediastinal contours are within normal limits.
Both lungs are clear. The visualized skeletal structures are
unremarkable.
IMPRESSION: No active disease.

## 2022-10-10 ENCOUNTER — Other Ambulatory Visit (HOSPITAL_BASED_OUTPATIENT_CLINIC_OR_DEPARTMENT_OTHER): Payer: Self-pay

## 2022-10-10 ENCOUNTER — Ambulatory Visit (HOSPITAL_BASED_OUTPATIENT_CLINIC_OR_DEPARTMENT_OTHER): Payer: Medicare Other | Admitting: Cardiovascular Disease

## 2022-10-11 ENCOUNTER — Other Ambulatory Visit (HOSPITAL_BASED_OUTPATIENT_CLINIC_OR_DEPARTMENT_OTHER): Payer: Self-pay

## 2022-10-11 MED ORDER — TERBINAFINE HCL 1 % EX CREA
1.0000 | TOPICAL_CREAM | Freq: Two times a day (BID) | CUTANEOUS | 3 refills | Status: AC
  Filled 2022-10-11: qty 15, 8d supply, fill #0
  Filled 2023-02-08: qty 15, 8d supply, fill #1

## 2022-10-11 MED ORDER — CLOBETASOL PROPIONATE 0.05 % EX OINT
1.0000 | TOPICAL_OINTMENT | Freq: Two times a day (BID) | CUTANEOUS | 1 refills | Status: AC
  Filled 2022-10-11: qty 30, 15d supply, fill #0
  Filled 2023-02-08: qty 30, 15d supply, fill #1

## 2022-10-11 MED ORDER — TRIAMCINOLONE ACETONIDE 0.1 % EX OINT
1.0000 | TOPICAL_OINTMENT | Freq: Two times a day (BID) | CUTANEOUS | 11 refills | Status: DC
  Filled 2022-10-11: qty 80, 40d supply, fill #0
  Filled 2023-02-08: qty 80, 40d supply, fill #1

## 2022-10-27 ENCOUNTER — Other Ambulatory Visit (HOSPITAL_BASED_OUTPATIENT_CLINIC_OR_DEPARTMENT_OTHER): Payer: Self-pay

## 2022-11-04 ENCOUNTER — Other Ambulatory Visit (HOSPITAL_BASED_OUTPATIENT_CLINIC_OR_DEPARTMENT_OTHER): Payer: Self-pay

## 2022-11-04 MED ORDER — INFLUENZA VAC A&B SURF ANT ADJ 0.5 ML IM SUSY
0.5000 mL | PREFILLED_SYRINGE | Freq: Once | INTRAMUSCULAR | 0 refills | Status: AC
  Filled 2022-11-04: qty 0.5, 1d supply, fill #0

## 2022-11-04 MED ORDER — COVID-19 MRNA VAC-TRIS(PFIZER) 30 MCG/0.3ML IM SUSY
0.3000 mL | PREFILLED_SYRINGE | Freq: Once | INTRAMUSCULAR | 0 refills | Status: AC
  Filled 2022-11-04: qty 0.3, 1d supply, fill #0

## 2022-11-17 ENCOUNTER — Other Ambulatory Visit (HOSPITAL_BASED_OUTPATIENT_CLINIC_OR_DEPARTMENT_OTHER): Payer: Self-pay

## 2022-11-17 MED ORDER — PREGABALIN 50 MG PO CAPS
50.0000 mg | ORAL_CAPSULE | Freq: Three times a day (TID) | ORAL | 3 refills | Status: DC
  Filled 2022-11-17: qty 180, 60d supply, fill #0
  Filled 2022-11-20: qty 90, 30d supply, fill #0
  Filled 2023-04-23: qty 270, 90d supply, fill #1
  Filled 2023-07-27 (×2): qty 270, 90d supply, fill #2

## 2022-11-20 ENCOUNTER — Other Ambulatory Visit (HOSPITAL_BASED_OUTPATIENT_CLINIC_OR_DEPARTMENT_OTHER): Payer: Self-pay

## 2022-12-18 ENCOUNTER — Other Ambulatory Visit: Payer: Self-pay | Admitting: Physical Medicine and Rehabilitation

## 2022-12-18 DIAGNOSIS — M79606 Pain in leg, unspecified: Secondary | ICD-10-CM

## 2022-12-20 ENCOUNTER — Other Ambulatory Visit (HOSPITAL_BASED_OUTPATIENT_CLINIC_OR_DEPARTMENT_OTHER): Payer: Self-pay

## 2022-12-20 MED ORDER — QULIPTA 60 MG PO TABS
60.0000 mg | ORAL_TABLET | Freq: Every day | ORAL | 3 refills | Status: DC
  Filled 2022-12-20: qty 90, 90d supply, fill #0

## 2022-12-27 ENCOUNTER — Other Ambulatory Visit (HOSPITAL_BASED_OUTPATIENT_CLINIC_OR_DEPARTMENT_OTHER): Payer: Self-pay

## 2022-12-28 NOTE — Discharge Instructions (Signed)

## 2022-12-29 ENCOUNTER — Ambulatory Visit
Admission: RE | Admit: 2022-12-29 | Discharge: 2022-12-29 | Disposition: A | Discharge status: Home / Self Care | Payer: Federal, State, Local not specified - PPO | Source: Ambulatory Visit | Attending: Physical Medicine and Rehabilitation | Admitting: Physical Medicine and Rehabilitation

## 2022-12-29 DIAGNOSIS — M545 Low back pain, unspecified: Secondary | ICD-10-CM

## 2022-12-29 DIAGNOSIS — M79606 Pain in leg, unspecified: Secondary | ICD-10-CM

## 2022-12-29 MED ORDER — MEPERIDINE HCL 50 MG/ML IJ SOLN: 50.0000 mg | Freq: Once | INTRAMUSCULAR | Status: DC | PRN

## 2022-12-29 MED ORDER — DIAZEPAM 5 MG PO TABS
5.0000 mg | ORAL_TABLET | Freq: Once | ORAL | Status: AC
Start: 2022-12-29 — End: 2022-12-29
  Administered 2022-12-29: 5 mg via ORAL

## 2022-12-29 MED ORDER — IOPAMIDOL (ISOVUE-M 200) INJECTION 41%
18.0000 mL | Freq: Once | INTRAMUSCULAR | Status: AC
  Administered 2022-12-29: 18 mL via INTRATHECAL

## 2022-12-29 MED ORDER — ONDANSETRON HCL 4 MG/2ML IJ SOLN: 4.0000 mg | Freq: Once | INTRAMUSCULAR | Status: DC | PRN

## 2023-01-08 ENCOUNTER — Other Ambulatory Visit (HOSPITAL_BASED_OUTPATIENT_CLINIC_OR_DEPARTMENT_OTHER): Payer: Self-pay

## 2023-01-08 MED ORDER — OMEGA-3-ACID ETHYL ESTERS 1 G PO CAPS
2.0000 g | ORAL_CAPSULE | Freq: Two times a day (BID) | ORAL | 3 refills | Status: DC
  Filled 2023-01-08: qty 360, 90d supply, fill #0
  Filled 2023-01-15: qty 120, 30d supply, fill #0
  Filled 2023-07-30 (×3): qty 360, 90d supply, fill #0

## 2023-01-15 ENCOUNTER — Other Ambulatory Visit (HOSPITAL_BASED_OUTPATIENT_CLINIC_OR_DEPARTMENT_OTHER): Payer: Self-pay

## 2023-01-15 ENCOUNTER — Other Ambulatory Visit: Payer: Self-pay

## 2023-01-15 ENCOUNTER — Encounter (HOSPITAL_BASED_OUTPATIENT_CLINIC_OR_DEPARTMENT_OTHER): Payer: Self-pay | Admitting: Pharmacist

## 2023-01-15 MED ORDER — TIZANIDINE HCL 4 MG PO TABS
4.0000 mg | ORAL_TABLET | Freq: Four times a day (QID) | ORAL | 2 refills | Status: DC | PRN
  Filled 2023-01-15: qty 90, 23d supply, fill #0
  Filled 2023-03-06: qty 90, 23d supply, fill #1
  Filled 2023-04-02: qty 90, 23d supply, fill #2

## 2023-01-22 ENCOUNTER — Other Ambulatory Visit (HOSPITAL_BASED_OUTPATIENT_CLINIC_OR_DEPARTMENT_OTHER): Payer: Self-pay

## 2023-01-22 MED ORDER — LOSARTAN POTASSIUM 25 MG PO TABS
50.0000 mg | ORAL_TABLET | Freq: Every day | ORAL | 1 refills | Status: DC
  Filled 2023-01-22: qty 180, 90d supply, fill #0

## 2023-01-22 MED ORDER — ATORVASTATIN CALCIUM 10 MG PO TABS
10.0000 mg | ORAL_TABLET | Freq: Every day | ORAL | 1 refills | Status: DC
  Filled 2023-01-22: qty 90, 90d supply, fill #0

## 2023-01-23 ENCOUNTER — Other Ambulatory Visit (HOSPITAL_BASED_OUTPATIENT_CLINIC_OR_DEPARTMENT_OTHER): Payer: Self-pay

## 2023-02-08 ENCOUNTER — Other Ambulatory Visit (HOSPITAL_BASED_OUTPATIENT_CLINIC_OR_DEPARTMENT_OTHER): Payer: Self-pay

## 2023-02-09 ENCOUNTER — Other Ambulatory Visit (HOSPITAL_BASED_OUTPATIENT_CLINIC_OR_DEPARTMENT_OTHER): Payer: Self-pay

## 2023-02-19 ENCOUNTER — Other Ambulatory Visit (HOSPITAL_BASED_OUTPATIENT_CLINIC_OR_DEPARTMENT_OTHER): Payer: Self-pay

## 2023-02-19 MED ORDER — QULIPTA 60 MG PO TABS
60.0000 mg | ORAL_TABLET | Freq: Every day | ORAL | 3 refills | Status: AC
  Filled 2023-02-19: qty 90, 90d supply, fill #0
  Filled 2023-04-13 – 2023-07-27 (×5): qty 90, 90d supply, fill #1

## 2023-03-06 ENCOUNTER — Other Ambulatory Visit (HOSPITAL_BASED_OUTPATIENT_CLINIC_OR_DEPARTMENT_OTHER): Payer: Self-pay

## 2023-03-13 ENCOUNTER — Other Ambulatory Visit (HOSPITAL_BASED_OUTPATIENT_CLINIC_OR_DEPARTMENT_OTHER): Payer: Self-pay

## 2023-03-13 MED ORDER — PREDNISONE 20 MG PO TABS
20.0000 mg | ORAL_TABLET | Freq: Every day | ORAL | 0 refills | Status: DC
  Filled 2023-03-13: qty 7, 7d supply, fill #0

## 2023-03-13 MED ORDER — ETODOLAC 500 MG PO TABS
500.0000 mg | ORAL_TABLET | Freq: Two times a day (BID) | ORAL | 0 refills | Status: AC | PRN
  Filled 2023-03-13: qty 30, 15d supply, fill #0

## 2023-03-14 ENCOUNTER — Other Ambulatory Visit (HOSPITAL_BASED_OUTPATIENT_CLINIC_OR_DEPARTMENT_OTHER): Payer: Self-pay

## 2023-04-02 ENCOUNTER — Other Ambulatory Visit (HOSPITAL_BASED_OUTPATIENT_CLINIC_OR_DEPARTMENT_OTHER): Payer: Self-pay

## 2023-04-03 IMAGING — CT CT HEAD W/O CM
4 series · 17 of 47 positions shown, 19 images · non-contrast
Comparison: None.

CLINICAL DATA: Head trauma



[Series 2: head wo · axial · 0.42mm/px · z∈[-181,-51]mm · 7 of 36 slices shown, 9 images]
[im 5/36  brain]
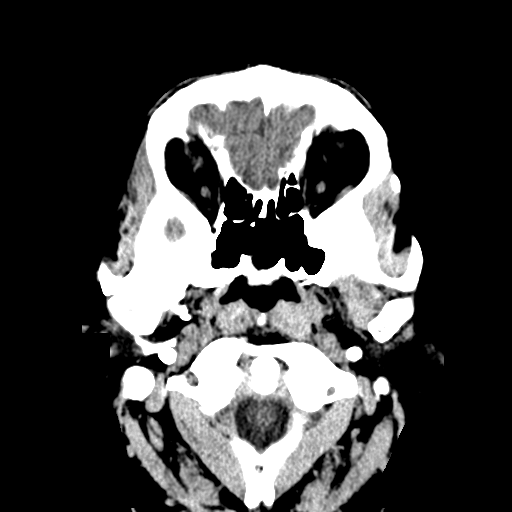
[im 5/36  bone]
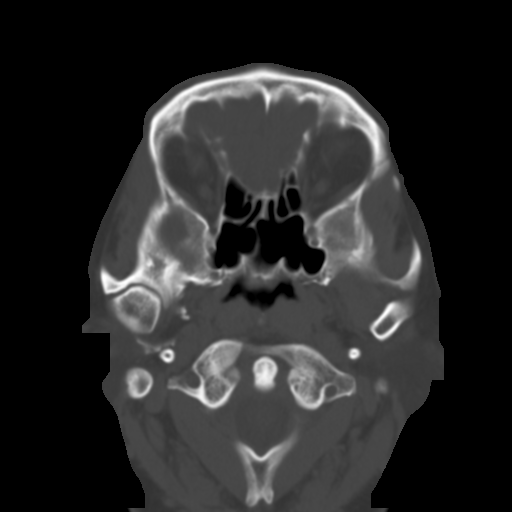
[im 9/36  brain]
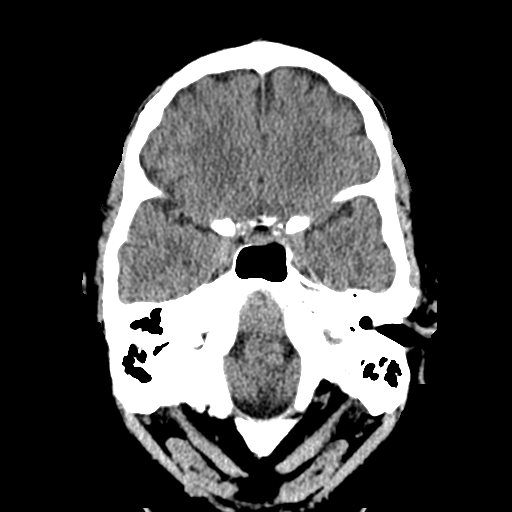
[im 14/36  brain]
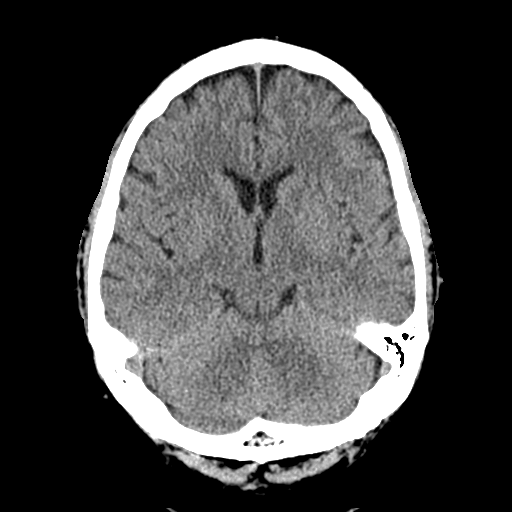
[im 18/36  brain]
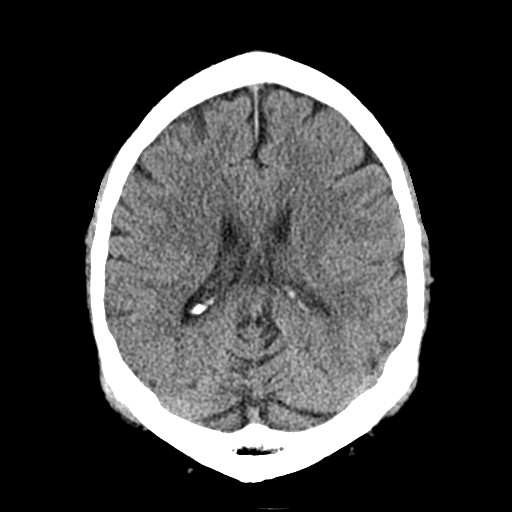
[im 22/36  brain]
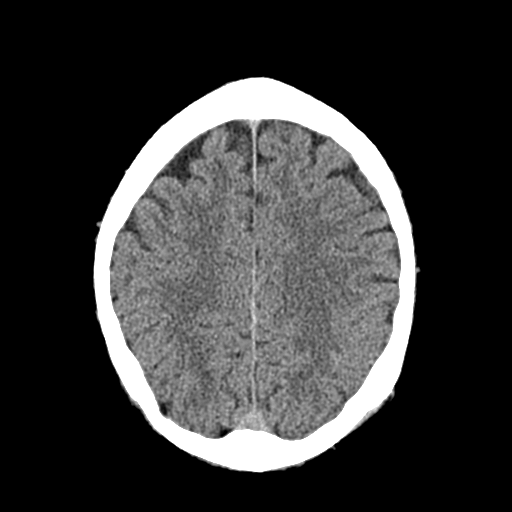
[im 22/36  bone]
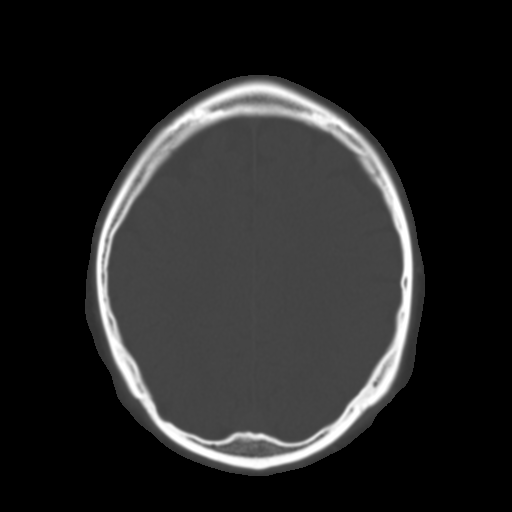
[im 27/36  brain]
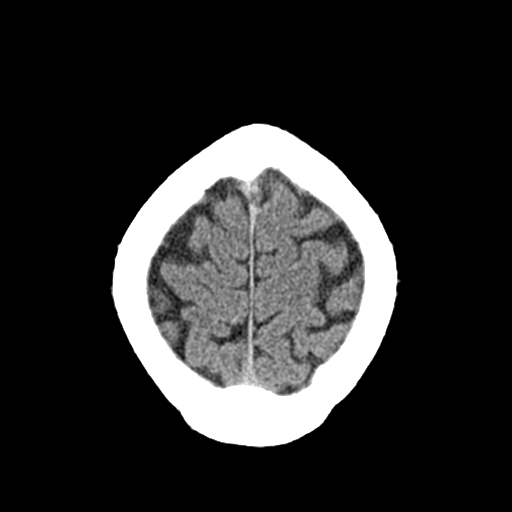
[im 31/36  brain]
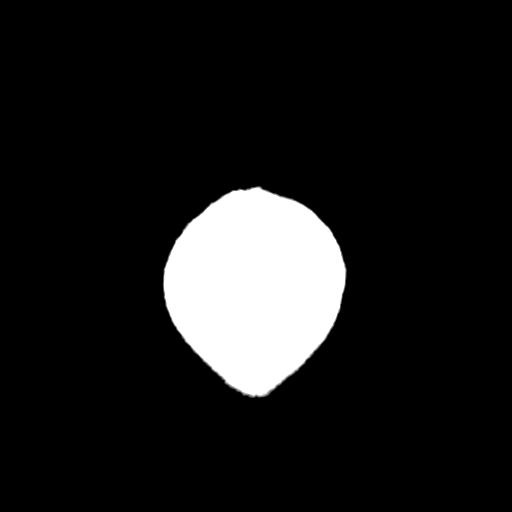

[Series 3: head bone · axial · 0.42mm/px · z∈[-185,-121]mm · 4 of 90 slices shown]
[im 9/90  bone]
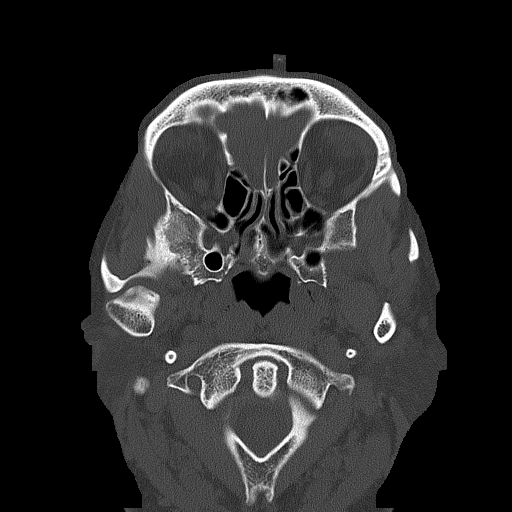
[im 18/90  bone]
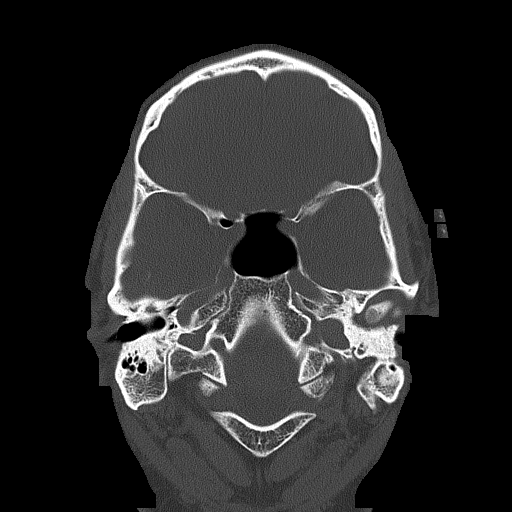
[im 27/90  bone]
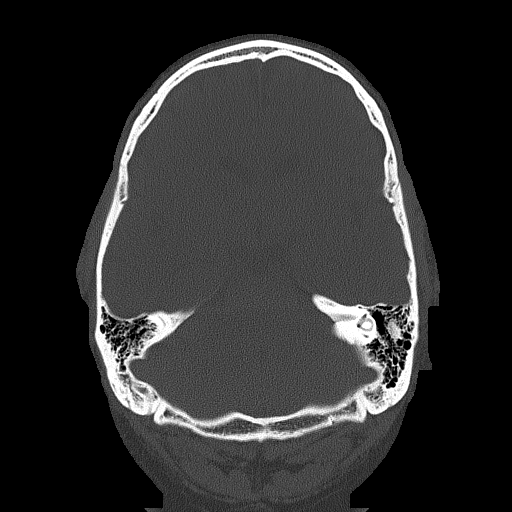
[im 41/90  bone]
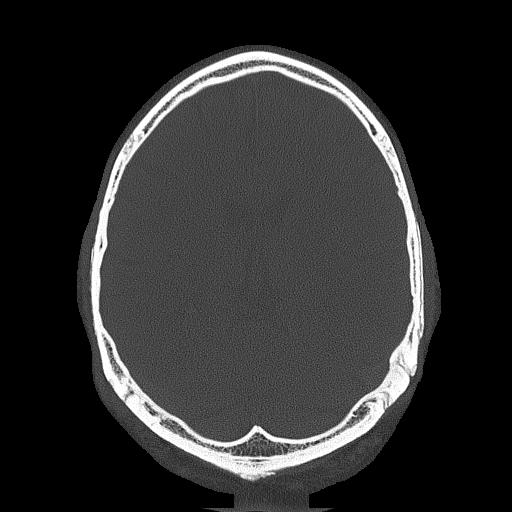

[Series 4: cor soft · coronal · 0.36mm/px · 3 of 74 slices shown]
[im 25/74  brain]
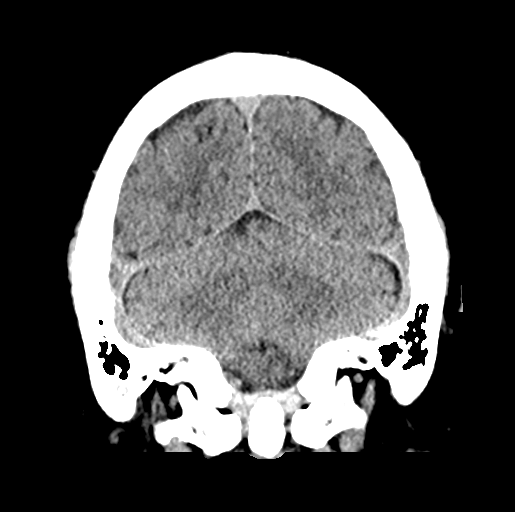
[im 33/74  brain]
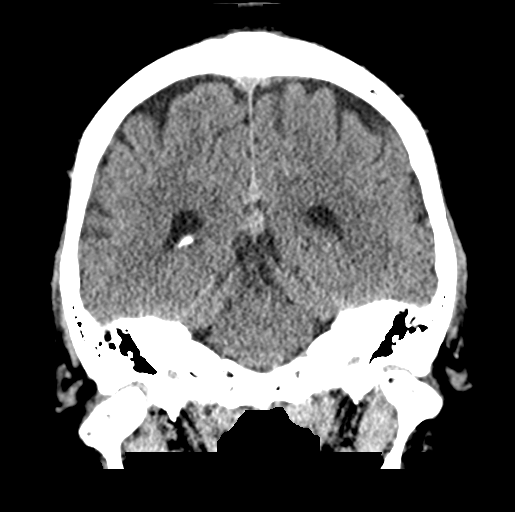
[im 41/74  brain]
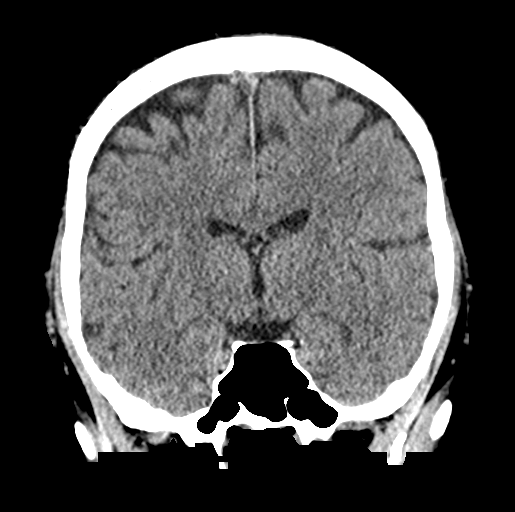

[Series 5: sag soft · sagittal · 0.35mm/px · 3 of 59 slices shown]
[im 20/59  brain]
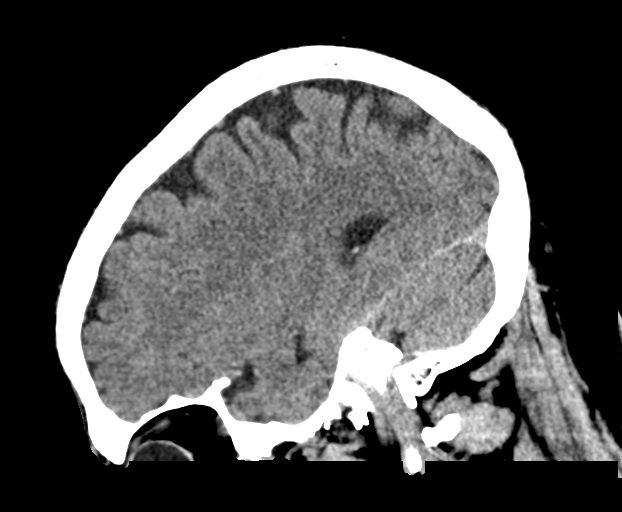
[im 30/59  brain]
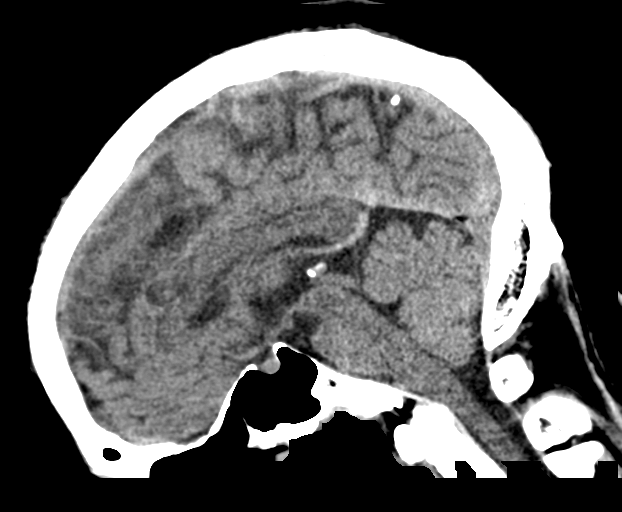
[im 39/59  brain]
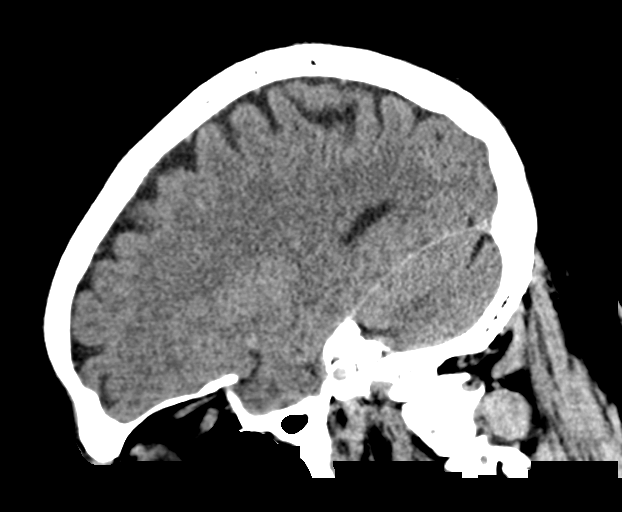

[17 of 47 positions shown; findings below may reference images not displayed]

FINDINGS: Brain: No evidence of acute infarction, hemorrhage, hydrocephalus,
extra-axial collection or mass lesion/mass effect.

Vascular: No hyperdense vessel or unexpected calcification.

Skull: Normal. Negative for fracture or focal lesion.

Sinuses/Orbits: No acute finding.

Other: None.
IMPRESSION: No acute intracranial abnormality.

## 2023-04-06 ENCOUNTER — Other Ambulatory Visit (HOSPITAL_BASED_OUTPATIENT_CLINIC_OR_DEPARTMENT_OTHER): Payer: Self-pay

## 2023-04-13 ENCOUNTER — Other Ambulatory Visit (HOSPITAL_BASED_OUTPATIENT_CLINIC_OR_DEPARTMENT_OTHER): Payer: Self-pay

## 2023-04-23 ENCOUNTER — Other Ambulatory Visit: Payer: Self-pay

## 2023-04-23 ENCOUNTER — Other Ambulatory Visit (HOSPITAL_BASED_OUTPATIENT_CLINIC_OR_DEPARTMENT_OTHER): Payer: Self-pay

## 2023-04-27 ENCOUNTER — Other Ambulatory Visit (HOSPITAL_BASED_OUTPATIENT_CLINIC_OR_DEPARTMENT_OTHER): Payer: Self-pay

## 2023-04-30 ENCOUNTER — Other Ambulatory Visit (HOSPITAL_BASED_OUTPATIENT_CLINIC_OR_DEPARTMENT_OTHER): Payer: Self-pay

## 2023-05-01 ENCOUNTER — Other Ambulatory Visit (HOSPITAL_BASED_OUTPATIENT_CLINIC_OR_DEPARTMENT_OTHER): Payer: Self-pay

## 2023-05-16 ENCOUNTER — Other Ambulatory Visit (HOSPITAL_BASED_OUTPATIENT_CLINIC_OR_DEPARTMENT_OTHER): Payer: Self-pay

## 2023-05-16 MED ORDER — TRIAMCINOLONE ACETONIDE 0.1 % EX OINT
1.0000 | TOPICAL_OINTMENT | Freq: Two times a day (BID) | CUTANEOUS | 4 refills | Status: AC
  Filled 2023-05-16: qty 80, 30d supply, fill #0

## 2023-05-28 ENCOUNTER — Other Ambulatory Visit (HOSPITAL_BASED_OUTPATIENT_CLINIC_OR_DEPARTMENT_OTHER): Payer: Self-pay

## 2023-06-01 ENCOUNTER — Other Ambulatory Visit (HOSPITAL_BASED_OUTPATIENT_CLINIC_OR_DEPARTMENT_OTHER): Payer: Self-pay

## 2023-06-01 MED ORDER — TIZANIDINE HCL 4 MG PO TABS
4.0000 mg | ORAL_TABLET | Freq: Four times a day (QID) | ORAL | 2 refills | Status: AC | PRN
  Filled 2023-06-01 – 2023-10-02 (×2): qty 90, 23d supply, fill #0
  Filled 2023-11-19: qty 90, 23d supply, fill #1
  Filled 2023-12-31: qty 90, 23d supply, fill #2

## 2023-06-01 MED ORDER — TIZANIDINE HCL 4 MG PO TABS
4.0000 mg | ORAL_TABLET | Freq: Four times a day (QID) | ORAL | 2 refills | Status: DC | PRN
  Filled 2023-06-01: qty 90, 23d supply, fill #0
  Filled 2023-07-16: qty 90, 23d supply, fill #1
  Filled 2023-08-29: qty 90, 23d supply, fill #2

## 2023-06-24 ENCOUNTER — Other Ambulatory Visit (HOSPITAL_BASED_OUTPATIENT_CLINIC_OR_DEPARTMENT_OTHER): Payer: Self-pay | Admitting: Family

## 2023-06-24 DIAGNOSIS — E785 Hyperlipidemia, unspecified: Secondary | ICD-10-CM

## 2023-06-25 ENCOUNTER — Encounter (HOSPITAL_BASED_OUTPATIENT_CLINIC_OR_DEPARTMENT_OTHER): Payer: Self-pay

## 2023-06-25 ENCOUNTER — Other Ambulatory Visit (HOSPITAL_BASED_OUTPATIENT_CLINIC_OR_DEPARTMENT_OTHER): Payer: Self-pay

## 2023-06-25 MED ORDER — ATORVASTATIN CALCIUM 20 MG PO TABS
20.0000 mg | ORAL_TABLET | Freq: Every day | ORAL | 0 refills | Status: DC
  Filled 2023-06-25: qty 90, 90d supply, fill #0

## 2023-07-07 NOTE — Progress Notes (Signed)
Declined SDOH.

## 2023-07-10 ENCOUNTER — Other Ambulatory Visit (HOSPITAL_BASED_OUTPATIENT_CLINIC_OR_DEPARTMENT_OTHER): Payer: Self-pay

## 2023-07-16 ENCOUNTER — Other Ambulatory Visit (HOSPITAL_BASED_OUTPATIENT_CLINIC_OR_DEPARTMENT_OTHER): Payer: Self-pay

## 2023-07-16 MED ORDER — TAMSULOSIN HCL 0.4 MG PO CAPS
0.4000 mg | ORAL_CAPSULE | Freq: Every day | ORAL | 3 refills | Status: AC
  Filled 2023-07-16: qty 90, 90d supply, fill #0
  Filled 2023-10-24: qty 90, 90d supply, fill #1

## 2023-07-25 ENCOUNTER — Other Ambulatory Visit (HOSPITAL_BASED_OUTPATIENT_CLINIC_OR_DEPARTMENT_OTHER): Payer: Self-pay

## 2023-07-27 ENCOUNTER — Other Ambulatory Visit (HOSPITAL_BASED_OUTPATIENT_CLINIC_OR_DEPARTMENT_OTHER): Payer: Self-pay

## 2023-07-30 ENCOUNTER — Other Ambulatory Visit (HOSPITAL_BASED_OUTPATIENT_CLINIC_OR_DEPARTMENT_OTHER): Payer: Self-pay

## 2023-07-30 MED ORDER — OMEGA-3-ACID ETHYL ESTERS 1 G PO CAPS
2.0000 g | ORAL_CAPSULE | Freq: Two times a day (BID) | ORAL | 3 refills | Status: AC
  Filled 2023-07-30 (×2): qty 360, 90d supply, fill #0
  Filled 2023-07-31: qty 120, 30d supply, fill #0

## 2023-07-31 ENCOUNTER — Other Ambulatory Visit (HOSPITAL_BASED_OUTPATIENT_CLINIC_OR_DEPARTMENT_OTHER): Payer: Self-pay

## 2023-08-01 ENCOUNTER — Other Ambulatory Visit (HOSPITAL_BASED_OUTPATIENT_CLINIC_OR_DEPARTMENT_OTHER): Payer: Self-pay

## 2023-08-01 MED ORDER — LOSARTAN POTASSIUM 25 MG PO TABS
50.0000 mg | ORAL_TABLET | Freq: Every day | ORAL | 1 refills | Status: DC
  Filled 2023-08-01: qty 180, 90d supply, fill #0
  Filled 2023-10-02: qty 180, 90d supply, fill #1

## 2023-08-01 MED ORDER — ATORVASTATIN CALCIUM 10 MG PO TABS
10.0000 mg | ORAL_TABLET | Freq: Every day | ORAL | 1 refills | Status: DC
  Filled 2023-08-01: qty 90, 90d supply, fill #0

## 2023-08-01 MED ORDER — GEMTESA 75 MG PO TABS
75.0000 mg | ORAL_TABLET | Freq: Every day | ORAL | 0 refills | Status: DC
  Filled 2023-08-01: qty 30, 30d supply, fill #0

## 2023-08-08 ENCOUNTER — Other Ambulatory Visit (HOSPITAL_BASED_OUTPATIENT_CLINIC_OR_DEPARTMENT_OTHER): Payer: Self-pay

## 2023-08-08 MED ORDER — LOSARTAN POTASSIUM 100 MG PO TABS
100.0000 mg | ORAL_TABLET | Freq: Every day | ORAL | 1 refills | Status: DC
  Filled 2023-08-08: qty 90, 90d supply, fill #0
  Filled 2023-10-13: qty 90, 90d supply, fill #1
  Filled 2023-10-15 (×2): qty 90, 90d supply, fill #0

## 2023-08-08 MED ORDER — ATORVASTATIN CALCIUM 10 MG PO TABS
10.0000 mg | ORAL_TABLET | Freq: Every day | ORAL | 1 refills | Status: DC
  Filled 2023-08-08: qty 90, 90d supply, fill #0

## 2023-10-02 ENCOUNTER — Other Ambulatory Visit (HOSPITAL_BASED_OUTPATIENT_CLINIC_OR_DEPARTMENT_OTHER): Payer: Self-pay

## 2023-10-14 ENCOUNTER — Other Ambulatory Visit (HOSPITAL_BASED_OUTPATIENT_CLINIC_OR_DEPARTMENT_OTHER): Payer: Self-pay

## 2023-10-14 ENCOUNTER — Encounter (HOSPITAL_BASED_OUTPATIENT_CLINIC_OR_DEPARTMENT_OTHER): Payer: Self-pay | Admitting: Pharmacist

## 2023-10-15 ENCOUNTER — Other Ambulatory Visit (HOSPITAL_BASED_OUTPATIENT_CLINIC_OR_DEPARTMENT_OTHER): Payer: Self-pay

## 2023-10-24 ENCOUNTER — Other Ambulatory Visit (HOSPITAL_BASED_OUTPATIENT_CLINIC_OR_DEPARTMENT_OTHER): Payer: Self-pay

## 2023-10-24 ENCOUNTER — Other Ambulatory Visit (HOSPITAL_BASED_OUTPATIENT_CLINIC_OR_DEPARTMENT_OTHER): Payer: Self-pay | Admitting: Family

## 2023-10-24 DIAGNOSIS — E785 Hyperlipidemia, unspecified: Secondary | ICD-10-CM

## 2023-10-24 MED ORDER — ATORVASTATIN CALCIUM 20 MG PO TABS
20.0000 mg | ORAL_TABLET | Freq: Every day | ORAL | 2 refills | Status: AC
  Filled 2023-10-24: qty 90, 90d supply, fill #0
  Filled 2023-10-29: qty 90, 90d supply, fill #1

## 2023-10-29 ENCOUNTER — Other Ambulatory Visit (HOSPITAL_BASED_OUTPATIENT_CLINIC_OR_DEPARTMENT_OTHER): Payer: Self-pay

## 2023-10-29 MED ORDER — DULCOLAX 5 MG PO TBEC
5.0000 mg | DELAYED_RELEASE_TABLET | ORAL | 0 refills | Status: AC
  Filled 2023-10-29: qty 4, 1d supply, fill #0

## 2023-10-29 MED ORDER — GOLYTELY 236 G PO SOLR
ORAL | 0 refills | Status: AC
  Filled 2023-10-29: qty 4000, 1d supply, fill #0

## 2023-10-30 NOTE — Progress Notes (Signed)
 The patient attended a screening event on 07/07/2023, where his blood pressure was measured at 129/76 mmHg after a second check, and his non-fasting blood glucose was 130 mg/dl. During the event, the patient reported that he does not smoke, has private/VA insurance, is established with a primary care provider (Osei-Bonsu, MD), and declined the SDOH screener.  A chart review indicates Kenneth Melendez - [facility] as his PCP.  The patient's most recent office visit was on 09/10/2023 and he does not have any upcoming appts indicated at this time. His provider is currently monitoring his BP. The pt has Medicare and BCBS as his insurance. There are currently no SDOH needs indicated at this time.  CHW sent a courtesy letter with bp education and Healthy cooking class in case needed by the pt.   At this time, no additional support from the Health Equity Team is indicated.

## 2023-11-05 ENCOUNTER — Other Ambulatory Visit (HOSPITAL_BASED_OUTPATIENT_CLINIC_OR_DEPARTMENT_OTHER): Payer: Self-pay

## 2023-11-05 MED ORDER — GEMTESA 75 MG PO TABS
75.0000 mg | ORAL_TABLET | Freq: Every day | ORAL | 3 refills | Status: AC
  Filled 2023-11-05: qty 90, 90d supply, fill #0

## 2023-11-06 ENCOUNTER — Other Ambulatory Visit: Payer: Self-pay

## 2023-11-15 ENCOUNTER — Other Ambulatory Visit (HOSPITAL_BASED_OUTPATIENT_CLINIC_OR_DEPARTMENT_OTHER): Payer: Self-pay

## 2023-11-19 ENCOUNTER — Other Ambulatory Visit (HOSPITAL_BASED_OUTPATIENT_CLINIC_OR_DEPARTMENT_OTHER): Payer: Self-pay

## 2023-11-23 ENCOUNTER — Other Ambulatory Visit (HOSPITAL_BASED_OUTPATIENT_CLINIC_OR_DEPARTMENT_OTHER): Payer: Self-pay

## 2023-11-23 MED ORDER — PREGABALIN 50 MG PO CAPS
50.0000 mg | ORAL_CAPSULE | Freq: Three times a day (TID) | ORAL | 3 refills | Status: DC
  Filled 2023-11-23: qty 270, 90d supply, fill #0

## 2023-11-25 ENCOUNTER — Emergency Department (HOSPITAL_BASED_OUTPATIENT_CLINIC_OR_DEPARTMENT_OTHER)

## 2023-11-25 ENCOUNTER — Other Ambulatory Visit: Payer: Self-pay

## 2023-11-25 ENCOUNTER — Emergency Department (HOSPITAL_BASED_OUTPATIENT_CLINIC_OR_DEPARTMENT_OTHER)
Admission: EM | Admit: 2023-11-25 | Discharge: 2023-11-25 | Disposition: A | Discharge status: Home / Self Care | Attending: Emergency Medicine | Admitting: Emergency Medicine

## 2023-11-25 ENCOUNTER — Encounter (HOSPITAL_BASED_OUTPATIENT_CLINIC_OR_DEPARTMENT_OTHER): Payer: Self-pay | Admitting: Emergency Medicine

## 2023-11-25 DIAGNOSIS — M25511 Pain in right shoulder: Secondary | ICD-10-CM | POA: Insufficient documentation

## 2023-11-25 DIAGNOSIS — R0789 Other chest pain: Secondary | ICD-10-CM

## 2023-11-25 DIAGNOSIS — W1830XA Fall on same level, unspecified, initial encounter: Secondary | ICD-10-CM | POA: Insufficient documentation

## 2023-11-25 DIAGNOSIS — W19XXXA Unspecified fall, initial encounter: Secondary | ICD-10-CM

## 2023-11-25 DIAGNOSIS — S2020XA Contusion of thorax, unspecified, initial encounter: Secondary | ICD-10-CM | POA: Diagnosis not present

## 2023-11-25 DIAGNOSIS — R0781 Pleurodynia: Secondary | ICD-10-CM | POA: Diagnosis present

## 2023-11-25 LAB — COMPREHENSIVE METABOLIC PANEL WITH GFR
ALT: 35 U/L (ref 0–44)
AST: 40 U/L (ref 15–41)
Albumin: 4.6 g/dL (ref 3.5–5.0)
Alkaline Phosphatase: 45 U/L (ref 38–126)
Anion gap: 10 (ref 5–15)
BUN: 18 mg/dL (ref 8–23)
CO2: 27 mmol/L (ref 22–32)
Calcium: 9.4 mg/dL (ref 8.9–10.3)
Chloride: 106 mmol/L (ref 98–111)
Creatinine, Ser: 0.91 mg/dL (ref 0.61–1.24)
GFR, Estimated: >60 mL/min (ref >60)
Glucose, Bld: 132 mg/dL — ABNORMAL HIGH (ref 70–99)
Potassium: 4 mmol/L (ref 3.5–5.1)
Sodium: 143 mmol/L (ref 135–145)
Total Bilirubin: 0.5 mg/dL (ref 0.0–1.2)
Total Protein: 7.1 g/dL (ref 6.5–8.1)

## 2023-11-25 LAB — URINALYSIS, ROUTINE W REFLEX MICROSCOPIC
Bilirubin Urine: NEGATIVE
Glucose, UA: NEGATIVE mg/dL
Hgb urine dipstick: NEGATIVE
Ketones, ur: NEGATIVE mg/dL
Leukocytes,Ua: NEGATIVE
Nitrite: NEGATIVE
Protein, ur: NEGATIVE mg/dL
Specific Gravity, Urine: >=1.03 (ref 1.005–1.030)
pH: 6 (ref 5.0–8.0)

## 2023-11-25 LAB — CBC WITH DIFFERENTIAL/PLATELET
Abs Immature Granulocytes: 0.01 K/uL (ref 0.00–0.07)
Basophils Absolute: 0 K/uL (ref 0.0–0.1)
Basophils Relative: 1 %
Eosinophils Absolute: 0.1 K/uL (ref 0.0–0.5)
Eosinophils Relative: 1 %
HCT: 44.8 % (ref 39.0–52.0)
Hemoglobin: 14.6 g/dL (ref 13.0–17.0)
Immature Granulocytes: 0 %
Lymphocytes Relative: 25 %
Lymphs Abs: 1.3 K/uL (ref 0.7–4.0)
MCH: 31.4 pg (ref 26.0–34.0)
MCHC: 32.6 g/dL (ref 30.0–36.0)
MCV: 96.3 fL (ref 80.0–100.0)
Monocytes Absolute: 0.3 K/uL (ref 0.1–1.0)
Monocytes Relative: 6 %
Neutro Abs: 3.4 K/uL (ref 1.7–7.7)
Neutrophils Relative %: 67 %
Platelets: 177 K/uL (ref 150–400)
RBC: 4.65 MIL/uL (ref 4.22–5.81)
RDW: 13.7 % (ref 11.5–15.5)
WBC: 5.1 K/uL (ref 4.0–10.5)
nRBC: 0 % (ref 0.0–0.2)

## 2023-11-25 LAB — TROPONIN T, HIGH SENSITIVITY: Troponin T High Sensitivity: <15 ng/L (ref 0–19)

## 2023-11-25 NOTE — ED Notes (Signed)
 Pt alert and oriented X 4 at the time of discharge. RR even and unlabored. No acute distress noted. Pt verbalized understanding of discharge instructions as discussed. Pt ambulatory to lobby at time of discharge.

## 2023-11-25 NOTE — ED Provider Notes (Signed)
 [place] [facility] AT [facility] Provider Note   CSN: [id number] Arrival date & time: 11/25/23  1349     Patient presents with: Kenneth Melendez is a 68 y.o. male.  68 year old male presents ED with complaints of fall last night.  Patient reports he has chronic back issues and has history of bilateral calf cramping that is worse at night.  He reports he was sleeping and had calf cramping then got up out of bed to try to work on his calves.  He then woke up on the floor and reports he does not remember how he got there.  His wife was awake during the incident and reports that she told him to get up to try to stretch out his calves and she laid back down and then heard a loud noise and went to go check on him and he was laying in the hall.  She reports he was not unconscious and said he just wanted to lay on the floor for note.  Patient was then brought into the kitchen where he was found to be mildly diaphoretic and she gave him magnesium  and coconut water  and he was back to his normal after a few moments.  Patient has not had any complaints with chest pain, shortness of breath, dizziness, weakness since incident.  Patient is only complaining of right shoulder and right rib pain.  Wife reports he has history of syncope due to dehydration.  Yesterday prior to the incident he was on the tractor all day and did not drink any water  and only had a small amount of a sandwich.     Prior to Admission medications   Medication Sig Start Date End Date Taking? Authorizing Provider  acyclovir  ointment (ZOVIRAX ) 5 % Apply 1 Application topically every 3 (three) hours for 7 days. 08/08/22     Ascorbic Acid  (VITAMIN C) 1000 MG tablet Take 1,000 mg by mouth in the morning.    [provider]  Atogepant  (QULIPTA ) 60 MG TABS Take 1 tablet (60 mg total) by mouth daily. 07/24/22     Atogepant  (QULIPTA ) 60 MG TABS Take 1 tablet (60 mg total) by mouth daily. 12/20/22      Atogepant  (QULIPTA ) 60 MG TABS Take 1 tablet (60 mg total) by mouth daily. 02/19/23     atorvastatin  (LIPITOR) 10 MG tablet Take 1 tablet (10 mg total) by mouth daily. 01/22/23     atorvastatin  (LIPITOR) 10 MG tablet Take 1 tablet (10 mg total) by mouth daily. 08/01/23     atorvastatin  (LIPITOR) 10 MG tablet Take 1 tablet (10 mg total) by mouth daily. 08/08/23     atorvastatin  (LIPITOR) 20 MG tablet Take 1 tablet (20 mg total) by mouth daily. Please call to schedule appointment for further refills. 10/24/23   Walker, Caitlin S, NP  BIOTIN PO Take 1 tablet by mouth in the morning.    [provider]  bisacodyl  (DULCOLAX) 5 MG EC tablet Take 1 tablet (5 mg total) by mouth as directed. 10/01/23     Cinnamon  500 MG capsule Take 500 mg by mouth in the morning.    [provider]  clobetasol  ointment (TEMOVATE ) 0.05 % Apply topically 2 (two) times a day. 10/11/22     ergocalciferol  (VITAMIN D2) 1.25 MG (50000 UT) capsule Take 1 capsule (50,000 Units total) by mouth once a week. 11/07/21     etodolac  (LODINE ) 500 MG tablet Take 1 tablet (500 mg total) by  mouth 2 (two) times daily as needed for 15 days. 03/13/23     GEMTESA  75 MG TABS Take 75 mg by mouth in the morning. 10/08/20   [provider]  losartan  (COZAAR ) 100 MG tablet Take 2 tablets (200 mg total) by mouth daily. 10/03/22     losartan  (COZAAR ) 100 MG tablet Take 1 tablet (100 mg total) by mouth daily. 08/08/23     losartan  (COZAAR ) 25 MG tablet Take 2 tablets (50 mg total) by mouth daily. 08/08/22     losartan  (COZAAR ) 25 MG tablet Take 2 tablets (50 mg total) by mouth daily. 01/22/23     losartan  (COZAAR ) 25 MG tablet Take 2 tablets (50 mg total) by mouth daily. 08/01/23     losartan  (COZAAR ) 50 MG tablet Take 2 tablets by mouth once daily 05/16/21     Multiple Vitamin (MULTIVITAMIN WITH MINERALS) TABS tablet Take 1 tablet by mouth in the morning.    [provider]  omega-3 acid ethyl esters (LOVAZA ) 1 g capsule Take 2  capsules (2 g total) by mouth 2 (two) times daily. 01/08/23     omega-3 acid ethyl esters (LOVAZA ) 1 g capsule Take 2 capsules (2 g total) by mouth 2 (two) times daily. 07/30/23     ondansetron  (ZOFRAN -ODT) 4 MG disintegrating tablet Take 1 tablet (4 mg total) by mouth 3 (three) times daily as needed. 11/04/21     polyethylene glycol (GOLYTELY ) 236 g solution Drink by mouth as directed. 10/01/23     predniSONE  (DELTASONE ) 20 MG tablet Take 1 tablet (20 mg total) by mouth daily. 03/13/23     pregabalin  (LYRICA ) 50 MG capsule Take 1 capsule (50 mg total) by mouth 3 (three) times daily. 04/26/22     pregabalin  (LYRICA ) 50 MG capsule Take 1 capsule (50 mg total) by mouth 2 (two) times daily. 08/07/22     pregabalin  (LYRICA ) 50 MG capsule Take one capsule (50 mg dose) by mouth 3 (three) times a day. Max Daily Amount: 150 mg 11/17/22     pregabalin  (LYRICA ) 50 MG capsule Take 1 capsule (50 mg total) by mouth 3 (three) times daily. Max daily dose 3 capsules (150 mg) 11/23/23     Rimegepant Sulfate  (NURTEC) 75 MG TBDP Take 75 mg by mouth daily as needed. Max 1 in 24 hours 01/27/22     scopolamine  (TRANSDERM-SCOP) 1 MG/3DAYS Place 1 patch (1.5 mg total) onto the skin every 3 (three) days. 08/16/22     sildenafil (VIAGRA) 100 MG tablet Take 1 tablet by mouth as needed for erectile dysfunction. 01/28/21   [provider]  tamsulosin  (FLOMAX ) 0.4 MG CAPS capsule Take 1 capsule (0.4 mg total) by mouth daily. 07/16/23     terbinafine  (LAMISIL ) 1 % cream Apply topically 2 (two) times a day. 10/11/22     tiZANidine  (ZANAFLEX ) 4 MG tablet Take 1 tablet (4 mg total) by mouth every 6 (six) hours as needed for muscle pain and spasms 06/01/23     tiZANidine  (ZANAFLEX ) 4 MG tablet Take one tablet (4 mg dose) by mouth every 6 (six) hours as needed. 06/01/23     triamcinolone  ointment (KENALOG ) 0.1 % Apply topically 2 (two) times a day. 10/11/22     triamcinolone  ointment (KENALOG ) 0.1 % Apply 1 Application topically 2 (two) times  daily. 05/16/23     Turmeric 500 MG CAPS Take 500 mg by mouth in the morning.    [provider]  valACYclovir  (VALTREX ) 1000 MG tablet Take 1  tablet (1,000 mg total) by mouth 2 (two) times daily for 10 days. 08/07/22     Vibegron  (GEMTESA ) 75 MG TABS Take 1 tablet (75 mg total) by mouth daily. 06/06/22     Vibegron  (GEMTESA ) 75 MG TABS Take 1 tablet (75 mg total) by mouth daily. 08/01/23     Vibegron  (GEMTESA ) 75 MG TABS Take 1 tablet (75 mg total) by mouth daily. 11/05/23       Allergies: Patient has no known allergies.    Review of Systems  Musculoskeletal:  Positive for arthralgias and myalgias.  Neurological:  Positive for syncope.  All other systems reviewed and are negative.   Updated Vital Signs BP 132/77 (BP Location: Left Arm)   Pulse 61   Temp 98.1 F (36.7 C)   Resp 20   Ht 6' 3.5 (1.918 m)   Wt 90.7 kg   SpO2 99%   BMI 24.67 kg/m   Physical Exam Vitals and nursing note reviewed.  Constitutional:      Appearance: Normal appearance.  HENT:     Head: Normocephalic and atraumatic.     Nose: Nose normal.  Eyes:     Extraocular Movements: Extraocular movements intact.     Conjunctiva/sclera: Conjunctivae normal.     Pupils: Pupils are equal, round, and reactive to light.  Cardiovascular:     Rate and Rhythm: Normal rate.  Pulmonary:     Effort: Pulmonary effort is normal. No respiratory distress.  Abdominal:     General: Abdomen is flat.     Tenderness: There is abdominal tenderness. There is no guarding.  Musculoskeletal:        General: Normal range of motion.     Cervical back: Normal range of motion.  Skin:    General: Skin is warm.     Capillary Refill: Capillary refill takes less than 2 seconds.  Neurological:     General: No focal deficit present.     Mental Status: He is alert.     Cranial Nerves: No cranial nerve deficit.  Psychiatric:        Mood and Affect: Mood normal.        Behavior: Behavior normal.     (all labs ordered are  listed, but only abnormal results are displayed) Labs Reviewed  CBC WITH DIFFERENTIAL/PLATELET  COMPREHENSIVE METABOLIC PANEL WITH GFR  URINALYSIS, ROUTINE W REFLEX MICROSCOPIC  TROPONIN T, HIGH SENSITIVITY    EKG: None  Radiology: No results found.  Procedures   Medications Ordered in the ED - No data to display  68 y.o. male presents to the ED with complaints of syncope and collapse with associated right shoulder and right rib pain after the fall, this involves an extensive number of treatment options, and is a complaint that carries with it a high risk of complications and morbidity.  The differential diagnosis includes arrhythmias, ACS, sepsis, orthostatic hypotension, TIA, CVA, UTI, (Ddx)  On arrival pt is nontoxic, vitals unremarkable. Exam significant for right shoulder pain to palpation and right rib pain on the axillary line  Additional history obtained from chart review significant for patient being followed by cardiology for nocturnal bradycardia and history of atrial flutter and treated with catheter ablation.  Patient has reported history of bradycardia cardiology as well.  I ordered medication *** for ***  Lab Tests:  I Ordered, reviewed, and interpreted labs, which included: CMP, CBC, UA, troponin  Imaging Studies ordered:  I ordered imaging studies which included shoulder and chest x-ray, CT head without  contrast, I independently visualized and interpreted imaging which showed all were negative for acute process  ED Course:   68 year old male presents to the ED with complaints of syncope and fall last night.  Patient has areas associated with right shoulder pain and right rib pain.  On exam patient is sitting comfortably in ED bed in no acute distress nontoxic-appearing.  Patient has some mild bruising to the the right ribs.  He has reported pain to palpation on the right shoulder with full range of motion without difficulty and no obvious bruising or dislocation.   There is no pain to palpation on the clavicle.  No tenderness to the superior portion of the [facility].  Lungs are clear to auscultation all fields.  Patient is reporting no neurological symptoms and there is no neurodeficits on exam.  Patient able reactive to light bilaterally with no pain to EOM.  No obvious injuries or lacerations on the head.  No pain to palpation throughout the head.  No swelling or pain to palpation in the calves and patient denies any current cramping.  Patient denies any chest pain or shortness of breath currently.  CMP, CBC, troponin of all unremarkable.  CT head and shoulder/chest x-ray overall unremarkable for acute abnormalities. UA     Portions of this note were generated with Scientist, clinical (histocompatibility and immunogenetics). Dictation errors may occur despite best attempts at proofreading.   Final diagnoses:  None    ED Discharge Orders     None

## 2023-11-25 NOTE — Discharge Instructions (Addendum)
 Imaging and lab work are reassuring today.  It is advised to increase water  consumption and eat 3 meals a day.  Please follow-up with cardiology for further evaluation.  If there are any new concerning symptoms including chest pain, shortness of breath, dizziness, fainting please return to ED for further evaluation.

## 2023-11-25 NOTE — ED Provider Triage Note (Signed)
 Emergency Medicine Provider Triage Evaluation Note  Kenneth Melendez , a 68 y.o. male  was evaluated in triage.  Pt complains of syncope and collapse last night.  Patient reports she was walking across the room and felt cramping in his legs and the next he remembers she was on the floor.  Patient is on any blood thinners and denies any chest pain or shortness of breath during the incident.  Patient is complaining of a headache, right shoulder, right rib pain.  No visual loss or neurodeficits noted on neuroexam.  Review of Systems  Positive: Syncope and collapse, right shoulder pain, right rib pain, cramping Negative: Chest pain, palpitations, shortness of breath, neurodeficit  Physical Exam  Ht 6' 3.5 (1.918 m)   Wt 90.7 kg   BMI 24.67 kg/m  Gen:   Awake, no distress   Resp:  Normal effort  MSK:   Moves extremities with some mild pain, mild pain to palpation throughout the shoulder and pain to palpation of the right rib Other:  No obvious lacerations or injuries to the head.  Medical Decision Making  Medically screening exam initiated at 3:29 PM.  Appropriate orders placed.  Kenneth Melendez was informed that the remainder of the evaluation will be completed by another provider, this initial triage assessment does not replace that evaluation, and the importance of remaining in the ED until their evaluation is complete.     Myriam Fonda RAMAN, [place] 11/25/23 1530

## 2023-11-25 NOTE — ED Triage Notes (Signed)
 Pt sts he fell last night; doesn't remember fall; c/o HA, RT ribs and RT shoulder pain; denies taking blood thinners

## 2023-11-26 ENCOUNTER — Other Ambulatory Visit: Payer: Self-pay

## 2023-11-27 ENCOUNTER — Other Ambulatory Visit (HOSPITAL_BASED_OUTPATIENT_CLINIC_OR_DEPARTMENT_OTHER): Payer: Self-pay

## 2023-11-27 MED ORDER — FLUZONE HIGH-DOSE 0.5 ML IM SUSY
0.5000 mL | PREFILLED_SYRINGE | Freq: Once | INTRAMUSCULAR | 0 refills | Status: AC
  Filled 2023-11-27: qty 0.5, 1d supply, fill #0

## 2023-11-27 MED ORDER — COMIRNATY 30 MCG/0.3ML IM SUSY
0.3000 mL | PREFILLED_SYRINGE | Freq: Once | INTRAMUSCULAR | 0 refills | Status: AC
  Filled 2023-11-27: qty 0.3, 1d supply, fill #0

## 2023-12-24 ENCOUNTER — Other Ambulatory Visit (HOSPITAL_BASED_OUTPATIENT_CLINIC_OR_DEPARTMENT_OTHER): Payer: Self-pay

## 2023-12-24 MED ORDER — AMLODIPINE BESYLATE 2.5 MG PO TABS
2.5000 mg | ORAL_TABLET | Freq: Every day | ORAL | 1 refills | Status: AC
Start: 2023-12-24 — End: ?
  Filled 2023-12-24: qty 90, 90d supply, fill #0

## 2023-12-25 LAB — LAB REPORT - SCANNED
Albumin, Urine POC: 0.2
Albumin/Creatinine Ratio, Urine, POC: 2
Creatinine, POC: 119 mg/dL
EGFR: 97
PSA, Total: 0.47

## 2024-01-04 ENCOUNTER — Other Ambulatory Visit (HOSPITAL_BASED_OUTPATIENT_CLINIC_OR_DEPARTMENT_OTHER): Payer: Self-pay

## 2024-01-06 ENCOUNTER — Other Ambulatory Visit (HOSPITAL_BASED_OUTPATIENT_CLINIC_OR_DEPARTMENT_OTHER): Payer: Self-pay

## 2024-01-07 ENCOUNTER — Other Ambulatory Visit (HOSPITAL_BASED_OUTPATIENT_CLINIC_OR_DEPARTMENT_OTHER): Payer: Self-pay

## 2024-01-07 MED ORDER — LOSARTAN POTASSIUM 100 MG PO TABS
100.0000 mg | ORAL_TABLET | Freq: Every day | ORAL | 1 refills | Status: AC
  Filled 2024-01-07 (×2): qty 90, 90d supply, fill #0

## 2024-01-08 ENCOUNTER — Other Ambulatory Visit: Payer: Self-pay

## 2024-01-09 ENCOUNTER — Ambulatory Visit (INDEPENDENT_AMBULATORY_CARE_PROVIDER_SITE_OTHER): Admitting: Cardiovascular Disease

## 2024-01-09 ENCOUNTER — Encounter (HOSPITAL_BASED_OUTPATIENT_CLINIC_OR_DEPARTMENT_OTHER): Payer: Self-pay | Admitting: Cardiovascular Disease

## 2024-01-09 VITALS — BP 110/70 | HR 56 | Ht 75.0 in | Wt 222.7 lb

## 2024-01-09 DIAGNOSIS — I2510 Atherosclerotic heart disease of native coronary artery without angina pectoris: Secondary | ICD-10-CM | POA: Diagnosis not present

## 2024-01-09 DIAGNOSIS — E785 Hyperlipidemia, unspecified: Secondary | ICD-10-CM | POA: Diagnosis not present

## 2024-01-09 DIAGNOSIS — I10 Essential (primary) hypertension: Secondary | ICD-10-CM | POA: Diagnosis not present

## 2024-01-09 DIAGNOSIS — I483 Typical atrial flutter: Secondary | ICD-10-CM

## 2024-01-09 DIAGNOSIS — R001 Bradycardia, unspecified: Secondary | ICD-10-CM

## 2024-01-09 NOTE — Progress Notes (Signed)
 Cardiology Office Note:  .   Date:  01/09/2024  ID:  Kenneth Melendez, DOB May 01, 1955, MRN [id number] PCP: Catalina Bare, MD  [facility] Providers Cardiologist:  Victory LELON Claudene DOUGLAS, MD (Inactive)    History of Present Illness: .    Kenneth Melendez is a 68 y.o. male with atrial flutter s/p ablation 02/2021, hypertension, hyperlipidemia, prediabetes, DDD s/p discectomy on 11/09/20 here for follow up.  He was previously followed by Dr. Claudene, initially seen by him 10/2017 for syncope that was felt to likely be related to dehydration/orthostasis secondary to changes in his antihypertensive therapy (discontinuation of Diovan 160 mg/day and initiation of Benicar HCT 40/25 mg/day).    He presented to the ED 10/09/2020 with dizziness, weakness, and elevated heart rate. He was diagnosed with rapid atrial flutter and underwent DCCV. Started on Eliquis  for CHADS2VASC score of 2. After wearing a 1 month monitor there were no recurring arrhythmias and anticoagulation was discontinued. He followed up with Dr. Claudene 01/2021 and was found to be in typical atrial flutter with 2-1 AV conduction. He was asymptomatic at the time. Started on metoprolol  50 mg daily and restarted Eliquis . Losartan  was discontinued due to hypotension. He had a second DCCV 01/19/2021 which was successful. He was seen by Dr. Waddell 01/2021 and underwent catheter ablation on 02/08/2021.   On 03/29/2022 he presented to the ED with intermittent chest pain for 24 hours.  EKG was normal, troponin was negative x2, chest x-ray was unrevealing.  Coronary CTA 05/2022 showed minimal plaque, and a calcium  score of 11.2 which was 45th percentile.  At his visit 07/2022 he was doing well.  He reported some episodes of bradycardia that were asymptomatic and mostly occurring overnight.  He was seen in the ED 11/2023 after a fall that occurred in the setting of back pain and leg cramping.   Discussed the use of AI scribe software for clinical note  transcription with the patient, who gave verbal consent to proceed.  History of Present Illness [name] experienced a painful cramp in both thighs while lying down, which led to a fall on a concrete floor. He does not recall how he ended up on the floor but remembers the severe pain. His wife mentioned that he was trying to stand up and shake off the cramp when he fell. He sustained swelling in his hand and bruises on his side from the fall.  He had been working outside that day and admits to possibly not drinking enough fluids. He recalls eating breakfast but is unsure about his fluid intake. No chest pain was experienced before the fall, although he mentioned to his wife that the pain was so severe he thought he might have a heart attack.  Since the episode, he feels okay without any lightheadedness or dizziness. He has been active, engaging in walking and climbing stairs without issues. His blood pressure was high around the time of the event, and he started losartan  2.5 mg two weeks ago. His blood pressure readings at home have been good recently.  He has a history of blood pressure issues and was not consistently taking his medication as prescribed, which led to elevated readings. His blood pressure was noted to be around 160/90 during the event, but it has since decreased with medication adjustments.  No episodes of atrial flutter or palpitations. He uses a device that alerts him to low heart rates, which frequently fall below 45 bpm, but he does not experience symptoms like  lightheadedness or dizziness. His heart rate drops can occur at any time, not just at night.  He has experienced weight fluctuations, noting a weight of 222 pounds today, which he attributes to wearing heavy clothing. 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  ROS:  As per HPI  Studies Reviewed: SABRA   EKG  Interpretation Date/Time:  Wednesday January 09 2024 08:40:44 EST Ventricular Rate:  52 PR Interval:  178 QRS Duration:  86 QT Interval:  456 QTC Calculation: 424 R Axis:   61  Text Interpretation: Sinus bradycardia Nonspecific ST abnormality Since last tracing Sinus rhythm NOW PRESENT Confirmed by Raford Riggs ([id number]) on 01/09/2024 8:47:22 AM   Coronary CT-A 05/2022: IMPRESSION: 1. Coronary calcium  score of 11.2. This was 45 percentile for age-, sex, and race-matched controls.   2. Normal coronary origin with right dominance.   3. Minimal (0-24) plaque in the OM.   4. Plaque overview; calcified plaque 4; noncalcified plaque 16; low attenuation plaque 1; total plaque 20 (All plaque noted in Lcx).   RECOMMENDATIONS: CAD-RADS 1: Minimal non-obstructive CAD (0-24%). Consider non-atherosclerotic causes of chest pain. Consider preventive therapy and risk factor modification.  Risk Assessment/Calculations:             Physical Exam:   VS:  BP 110/70   Pulse (!) 56   Ht 6' 3 (1.905 m)   Wt 222 lb 11.2 oz (101 kg)   SpO2 98%   BMI 27.84 kg/m  , BMI Body mass index is 27.84 kg/m. GENERAL:  Well appearing HEENT: Pupils equal round and reactive, fundi not visualized, oral mucosa unremarkable NECK:  No jugular venous distention, waveform within normal limits, carotid upstroke brisk and symmetric, no bruits, no thyromegaly LUNGS:  Clear to auscultation bilaterally HEART:  Bradycardia.  Regular rhythm.  PMI not displaced or sustained,S1 and S2 within normal limits, no S3, no S4, no clicks, no rubs, no murmurs ABD:  Flat, positive bowel sounds normal in frequency in pitch, no bruits, no rebound, no guarding, no midline pulsatile mass, no hepatomegaly, no splenomegaly EXT:  2 plus pulses throughout, no edema, no cyanosis no clubbing SKIN:  No rashes no nodules NEURO:  Cranial nerves II through XII grossly intact, motor grossly intact throughout PSYCH:  Cognitively intact,  oriented to person place and time   ASSESSMENT AND PLAN: .    Assessment & Plan # Bradycardia Heart rate below 45 bpm, asymptomatic. Recent syncope likely due to dehydration and vagal response. No current need for pacemaker. - Monitor heart rate and symptoms. - Consider pacemaker if symptomatic bradycardia develops. - Will get an ETT to assess for chronotropic incompetence.  # Essential hypertension Blood pressure controlled with losartan  and amlodipine . Recent weight loss may lower blood pressure. Stable in 110s-120s range. - Monitor blood pressure at home. - Adjust medication if blood pressure falls below 100s.  # Typical atrial flutter No recent episodes or palpitations since ablation. No arrhythmias noted on his watch.   # Hyperlipidemia Cholesterol levels were recently checked with his PCP.  We will get a copy. - Requested recent cholesterol lab results.    Dispo: f/u 4 months.  Signed, Riggs Raford, MD

## 2024-01-09 NOTE — Patient Instructions (Signed)
 Medication Instructions:  Your physician recommends that you continue on your current medications as directed. Please refer to the Current Medication list given to you today.   *If you need a refill on your cardiac medications before your next appointment, please call your pharmacy*  Lab Work: NONE  Testing/Procedures: Your physician has requested that you have an exercise tolerance test. For further information please visit [email or web address] Please also follow instruction sheet, as given.  Follow-Up: At [facility], you and your health needs are our priority.  As part of our continuing mission to provide you with exceptional heart care, our providers are all part of one team.  This team includes your primary Cardiologist (physician) and Advanced Practice Providers or APPs (Physician Assistants and Nurse Practitioners) who all work together to provide you with the care you need, when you need it.  Your next appointment:   4 month(s)  Provider:   Annabella Scarce, MD, Rosaline Bane, NP, or Reche Finder, NP    We recommend signing up for the patient portal called MyChart.  Sign up information is provided on this After Visit Summary.  MyChart is used to connect with patients for Virtual Visits (Telemedicine).  Patients are able to view lab/test results, encounter notes, upcoming appointments, etc.  Non-urgent messages can be sent to your provider as well.   To learn more about what you can do with MyChart, go to [email or web address].   Other Instructions  EXERCISE TREADMILL  Please arrive 15 minutes prior to your appointment time for registration and insurance purposes.  The test will take approximately 45 minutes to complete.  How to prepare for your Exercise Stress Test:  Do wear comfortable clothes (no dresses or overalls) and walking shoes, tennis shoes preferred (no heels or open toed shoes are allowed) Do Not wear cologne, perfume, aftershave or lotions  (deodorant is allowed).  If these instructions are not followed, your test will have to be rescheduled.  If you cannot keep your appointment, please provide 24 hours notification to the Stress Lab, to avoid a possible $50 charge to your account

## 2024-01-13 ENCOUNTER — Encounter (HOSPITAL_BASED_OUTPATIENT_CLINIC_OR_DEPARTMENT_OTHER): Payer: Self-pay | Admitting: Cardiovascular Disease

## 2024-01-14 ENCOUNTER — Other Ambulatory Visit (HOSPITAL_BASED_OUTPATIENT_CLINIC_OR_DEPARTMENT_OTHER): Payer: Self-pay

## 2024-01-14 ENCOUNTER — Telehealth (HOSPITAL_COMMUNITY): Payer: Self-pay | Admitting: *Deleted

## 2024-01-14 ENCOUNTER — Other Ambulatory Visit: Payer: Self-pay

## 2024-01-14 MED ORDER — ATORVASTATIN CALCIUM 10 MG PO TABS
10.0000 mg | ORAL_TABLET | Freq: Every day | ORAL | 1 refills | Status: AC
  Filled 2024-01-14 – 2024-01-25 (×2): qty 90, 90d supply, fill #0

## 2024-01-14 MED ORDER — GEMTESA 75 MG PO TABS
75.0000 mg | ORAL_TABLET | Freq: Every day | ORAL | 1 refills | Status: AC
  Filled 2024-01-14 – 2024-02-20 (×2): qty 90, 90d supply, fill #0
  Filled ????-??-??: fill #0

## 2024-01-14 MED ORDER — OMEGA-3-ACID ETHYL ESTERS 1 G PO CAPS
2.0000 | ORAL_CAPSULE | Freq: Two times a day (BID) | ORAL | 1 refills | Status: AC
  Filled 2024-01-14: qty 360, 90d supply, fill #0

## 2024-01-14 MED ORDER — TAMSULOSIN HCL 0.4 MG PO CAPS
0.4000 mg | ORAL_CAPSULE | Freq: Every day | ORAL | 1 refills | Status: AC
  Filled 2024-01-14 – 2024-01-25 (×2): qty 90, 90d supply, fill #0

## 2024-01-14 NOTE — Telephone Encounter (Signed)
 Left message with instructions for upcoming GXT.  Claudene Ronal Quale, RN

## 2024-01-16 ENCOUNTER — Inpatient Hospital Stay (HOSPITAL_COMMUNITY)
Admission: RE | Admit: 2024-01-16 | Discharge: 2024-01-16 | Attending: Cardiovascular Disease | Admitting: Cardiovascular Disease

## 2024-01-16 DIAGNOSIS — I10 Essential (primary) hypertension: Secondary | ICD-10-CM | POA: Diagnosis present

## 2024-01-16 DIAGNOSIS — I483 Typical atrial flutter: Secondary | ICD-10-CM | POA: Diagnosis present

## 2024-01-16 DIAGNOSIS — R001 Bradycardia, unspecified: Secondary | ICD-10-CM | POA: Diagnosis present

## 2024-01-16 LAB — EXERCISE TOLERANCE TEST
Angina Index: 0
Duke Treadmill Score: -7
Estimated workload: 10.1
Exercise duration (min): 8 min
MPHR: 150 {beats}/min
Peak HR: 150 {beats}/min
Percent HR: 100 %
Rest HR: 59 {beats}/min
ST Depression (mm): 3 mm

## 2024-01-17 ENCOUNTER — Other Ambulatory Visit (HOSPITAL_BASED_OUTPATIENT_CLINIC_OR_DEPARTMENT_OTHER): Payer: Self-pay

## 2024-01-23 ENCOUNTER — Ambulatory Visit: Payer: Self-pay | Admitting: Cardiovascular Disease

## 2024-01-24 ENCOUNTER — Other Ambulatory Visit (HOSPITAL_BASED_OUTPATIENT_CLINIC_OR_DEPARTMENT_OTHER): Payer: Self-pay

## 2024-01-25 ENCOUNTER — Other Ambulatory Visit (HOSPITAL_BASED_OUTPATIENT_CLINIC_OR_DEPARTMENT_OTHER): Payer: Self-pay

## 2024-01-30 ENCOUNTER — Telehealth (HOSPITAL_BASED_OUTPATIENT_CLINIC_OR_DEPARTMENT_OTHER): Payer: Self-pay | Admitting: *Deleted

## 2024-01-30 NOTE — Telephone Encounter (Signed)
-----   Message from Annabella Scarce, MD sent at 01/29/2024  5:40 PM EST ----- Labs reviewed.  Cholesterol levels look good.  Glucose and Hgb A1c are a little high.  Work on limiting carbohydrates and getting at least 150 minutes of exercise weekly.   Tiffany C. Scarce, MD, FACC

## 2024-01-30 NOTE — Telephone Encounter (Signed)
 My chart message to patient

## 2024-02-11 ENCOUNTER — Other Ambulatory Visit (HOSPITAL_BASED_OUTPATIENT_CLINIC_OR_DEPARTMENT_OTHER): Payer: Self-pay

## 2024-02-13 ENCOUNTER — Encounter (HOSPITAL_BASED_OUTPATIENT_CLINIC_OR_DEPARTMENT_OTHER): Payer: Self-pay

## 2024-02-13 ENCOUNTER — Other Ambulatory Visit (HOSPITAL_BASED_OUTPATIENT_CLINIC_OR_DEPARTMENT_OTHER): Payer: Self-pay

## 2024-02-20 ENCOUNTER — Other Ambulatory Visit (HOSPITAL_BASED_OUTPATIENT_CLINIC_OR_DEPARTMENT_OTHER): Payer: Self-pay

## 2024-02-22 ENCOUNTER — Ambulatory Visit (HOSPITAL_BASED_OUTPATIENT_CLINIC_OR_DEPARTMENT_OTHER): Admitting: Cardiovascular Disease
